# Patient Record
Sex: Female | Born: 1960 | Race: Black or African American | Hispanic: No | Smoking: Never smoker
Health system: Southern US, Community
[De-identification: ages and names within clinical notes are randomized; demographics above are authoritative.]

## PROBLEM LIST (undated history)

## (undated) DIAGNOSIS — M549 Dorsalgia, unspecified: Secondary | ICD-10-CM

## (undated) DIAGNOSIS — Z800 Family history of malignant neoplasm of digestive organs: Secondary | ICD-10-CM

## (undated) DIAGNOSIS — Z803 Family history of malignant neoplasm of breast: Secondary | ICD-10-CM

## (undated) DIAGNOSIS — M797 Fibromyalgia: Secondary | ICD-10-CM

## (undated) DIAGNOSIS — F32A Depression, unspecified: Secondary | ICD-10-CM

## (undated) DIAGNOSIS — E119 Type 2 diabetes mellitus without complications: Secondary | ICD-10-CM

## (undated) DIAGNOSIS — C801 Malignant (primary) neoplasm, unspecified: Secondary | ICD-10-CM

## (undated) DIAGNOSIS — G43909 Migraine, unspecified, not intractable, without status migrainosus: Secondary | ICD-10-CM

## (undated) DIAGNOSIS — I10 Essential (primary) hypertension: Secondary | ICD-10-CM

## (undated) DIAGNOSIS — D649 Anemia, unspecified: Secondary | ICD-10-CM

## (undated) DIAGNOSIS — E7800 Pure hypercholesterolemia, unspecified: Secondary | ICD-10-CM

## (undated) DIAGNOSIS — E1142 Type 2 diabetes mellitus with diabetic polyneuropathy: Secondary | ICD-10-CM

## (undated) DIAGNOSIS — F419 Anxiety disorder, unspecified: Secondary | ICD-10-CM

## (undated) DIAGNOSIS — G8929 Other chronic pain: Secondary | ICD-10-CM

## (undated) DIAGNOSIS — F329 Major depressive disorder, single episode, unspecified: Secondary | ICD-10-CM

## (undated) HISTORY — DX: Depression, unspecified: F32.A

## (undated) HISTORY — DX: Anxiety disorder, unspecified: F41.9

## (undated) HISTORY — DX: Major depressive disorder, single episode, unspecified: F32.9

## (undated) HISTORY — PX: COLONOSCOPY: SHX174

## (undated) HISTORY — DX: Family history of malignant neoplasm of digestive organs: Z80.0

## (undated) HISTORY — DX: Family history of malignant neoplasm of breast: Z80.3

---

## 1986-12-17 HISTORY — PX: TUBAL LIGATION: SHX77

## 2006-11-17 ENCOUNTER — Emergency Department (HOSPITAL_COMMUNITY): Admission: EM | Admit: 2006-11-17 | Discharge: 2006-11-17 | Payer: Self-pay | Admitting: Emergency Medicine

## 2008-05-18 ENCOUNTER — Emergency Department (HOSPITAL_COMMUNITY): Admission: EM | Admit: 2008-05-18 | Discharge: 2008-05-18 | Payer: Self-pay | Admitting: Emergency Medicine

## 2008-05-19 ENCOUNTER — Emergency Department (HOSPITAL_COMMUNITY): Admission: EM | Admit: 2008-05-19 | Discharge: 2008-05-19 | Payer: Self-pay | Admitting: Emergency Medicine

## 2009-08-26 ENCOUNTER — Inpatient Hospital Stay (HOSPITAL_COMMUNITY): Admission: EM | Admit: 2009-08-26 | Discharge: 2009-08-30 | Payer: Self-pay | Admitting: Emergency Medicine

## 2009-09-01 ENCOUNTER — Encounter: Admission: RE | Admit: 2009-09-01 | Discharge: 2009-09-01 | Payer: Self-pay | Admitting: Internal Medicine

## 2009-09-23 ENCOUNTER — Ambulatory Visit: Payer: Self-pay | Admitting: Internal Medicine

## 2009-09-28 ENCOUNTER — Ambulatory Visit: Payer: Self-pay | Admitting: *Deleted

## 2009-10-11 ENCOUNTER — Ambulatory Visit: Payer: Self-pay | Admitting: Internal Medicine

## 2009-12-07 ENCOUNTER — Ambulatory Visit: Payer: Self-pay | Admitting: Internal Medicine

## 2009-12-07 LAB — CONVERTED CEMR LAB
ALT: 20 units/L (ref 0–35)
AST: 21 units/L (ref 0–37)
Alkaline Phosphatase: 84 units/L (ref 39–117)
BUN: 10 mg/dL (ref 6–23)
Calcium: 9.7 mg/dL (ref 8.4–10.5)
Creatinine, Ser: 0.68 mg/dL (ref 0.40–1.20)
HDL: 54 mg/dL (ref >39)
Hgb A1c MFr Bld: 7.3 % — ABNORMAL HIGH (ref 4.6–6.1)
LDL Cholesterol: 132 mg/dL — ABNORMAL HIGH (ref 0–99)
Total Bilirubin: 0.5 mg/dL (ref 0.3–1.2)
Total CHOL/HDL Ratio: 3.8
VLDL: 21 mg/dL (ref 0–40)

## 2010-01-11 ENCOUNTER — Ambulatory Visit: Payer: Self-pay | Admitting: Internal Medicine

## 2010-04-24 ENCOUNTER — Ambulatory Visit: Payer: Self-pay | Admitting: Internal Medicine

## 2010-04-24 LAB — CONVERTED CEMR LAB
CO2: 24 meq/L (ref 19–32)
Calcium: 9.4 mg/dL (ref 8.4–10.5)
Creatinine, Ser: 0.65 mg/dL (ref 0.40–1.20)
TSH: 1.966 microintl units/mL (ref 0.350–4.500)

## 2010-05-29 ENCOUNTER — Ambulatory Visit: Payer: Self-pay | Admitting: Internal Medicine

## 2011-03-23 LAB — URINALYSIS, ROUTINE W REFLEX MICROSCOPIC
Glucose, UA: >1000 mg/dL — AB
Ketones, ur: 15 mg/dL — AB
Leukocytes, UA: NEGATIVE
Protein, ur: NEGATIVE mg/dL
Urobilinogen, UA: 0.2 mg/dL (ref 0.0–1.0)

## 2011-03-23 LAB — GLUCOSE, CAPILLARY
Glucose-Capillary: 174 mg/dL — ABNORMAL HIGH (ref 70–99)
Glucose-Capillary: 224 mg/dL — ABNORMAL HIGH (ref 70–99)
Glucose-Capillary: 293 mg/dL — ABNORMAL HIGH (ref 70–99)
Glucose-Capillary: 299 mg/dL — ABNORMAL HIGH (ref 70–99)
Glucose-Capillary: 312 mg/dL — ABNORMAL HIGH (ref 70–99)
Glucose-Capillary: 319 mg/dL — ABNORMAL HIGH (ref 70–99)
Glucose-Capillary: 319 mg/dL — ABNORMAL HIGH (ref 70–99)
Glucose-Capillary: 331 mg/dL — ABNORMAL HIGH (ref 70–99)
Glucose-Capillary: 334 mg/dL — ABNORMAL HIGH (ref 70–99)
Glucose-Capillary: 353 mg/dL — ABNORMAL HIGH (ref 70–99)
Glucose-Capillary: 372 mg/dL — ABNORMAL HIGH (ref 70–99)

## 2011-03-23 LAB — LIPID PANEL
HDL: 35 mg/dL — ABNORMAL LOW (ref >39)
Total CHOL/HDL Ratio: 6.4 RATIO
Triglycerides: 449 mg/dL — ABNORMAL HIGH (ref <150)
VLDL: UNDETERMINED mg/dL (ref 0–40)

## 2011-03-23 LAB — BASIC METABOLIC PANEL
CO2: 24 mEq/L (ref 19–32)
CO2: 25 mEq/L (ref 19–32)
CO2: 27 mEq/L (ref 19–32)
Calcium: 8.7 mg/dL (ref 8.4–10.5)
Chloride: 101 mEq/L (ref 96–112)
Chloride: 96 mEq/L (ref 96–112)
GFR calc non Af Amer: >60 mL/min (ref >60)
Glucose, Bld: 273 mg/dL — ABNORMAL HIGH (ref 70–99)
Glucose, Bld: 340 mg/dL — ABNORMAL HIGH (ref 70–99)
Glucose, Bld: 351 mg/dL — ABNORMAL HIGH (ref 70–99)
Glucose, Bld: 480 mg/dL — ABNORMAL HIGH (ref 70–99)
Potassium: 3.5 mEq/L (ref 3.5–5.1)
Potassium: 3.6 mEq/L (ref 3.5–5.1)
Potassium: 4.1 mEq/L (ref 3.5–5.1)
Sodium: 134 mEq/L — ABNORMAL LOW (ref 135–145)
Sodium: 135 mEq/L (ref 135–145)
Sodium: 136 mEq/L (ref 135–145)
Sodium: 136 mEq/L (ref 135–145)

## 2011-03-23 LAB — DIFFERENTIAL
Basophils Absolute: 0 10*3/uL (ref 0.0–0.1)
Basophils Relative: 0 % (ref 0–1)
Eosinophils Relative: 4 % (ref 0–5)
Lymphocytes Relative: 25 % (ref 12–46)
Monocytes Absolute: 0.4 10*3/uL (ref 0.1–1.0)
Monocytes Absolute: 0.5 10*3/uL (ref 0.1–1.0)
Monocytes Relative: 3 % (ref 3–12)
Neutro Abs: 11.4 10*3/uL — ABNORMAL HIGH (ref 1.7–7.7)

## 2011-03-23 LAB — CBC
HCT: 36.5 % (ref 36.0–46.0)
HCT: 38.3 % (ref 36.0–46.0)
Hemoglobin: 12 g/dL (ref 12.0–15.0)
Hemoglobin: 12.7 g/dL (ref 12.0–15.0)
Hemoglobin: 14.3 g/dL (ref 12.0–15.0)
MCHC: 33 g/dL (ref 30.0–36.0)
MCV: 80.2 fL (ref 78.0–100.0)
RBC: 4.78 MIL/uL (ref 3.87–5.11)
RBC: 5.41 MIL/uL — ABNORMAL HIGH (ref 3.87–5.11)
RDW: 15 % (ref 11.5–15.5)
RDW: 15.1 % (ref 11.5–15.5)

## 2011-03-23 LAB — POCT I-STAT 3, VENOUS BLOOD GAS (G3P V)
Bicarbonate: 27.8 mEq/L — ABNORMAL HIGH (ref 20.0–24.0)
pH, Ven: 7.349 — ABNORMAL HIGH (ref 7.250–7.300)
pO2, Ven: 21 mmHg — CL (ref 30.0–45.0)

## 2011-03-23 LAB — URINE MICROSCOPIC-ADD ON

## 2011-03-23 LAB — TSH: TSH: 1.586 u[IU]/mL (ref 0.350–4.500)

## 2011-03-23 LAB — HEMOGLOBIN A1C: Hgb A1c MFr Bld: 11.3 % — ABNORMAL HIGH (ref 4.6–6.1)

## 2011-09-13 LAB — DIFFERENTIAL
Basophils Relative: 0
Eosinophils Absolute: 0.4
Eosinophils Relative: 2
Lymphs Abs: 3.9
Monocytes Relative: 2 — ABNORMAL LOW
Neutrophils Relative %: 76

## 2011-09-13 LAB — CBC
Hemoglobin: 11.5 — ABNORMAL LOW
MCHC: 33.4
Platelets: 561 — ABNORMAL HIGH
RDW: 14.9

## 2011-09-13 LAB — POCT I-STAT, CHEM 8
Creatinine, Ser: 0.6
Glucose, Bld: 103 — ABNORMAL HIGH
HCT: 37
Hemoglobin: 12.6
Potassium: 3.5
Sodium: 137
TCO2: 21

## 2011-09-13 LAB — URINALYSIS, ROUTINE W REFLEX MICROSCOPIC
Glucose, UA: NEGATIVE
Ketones, ur: NEGATIVE
Specific Gravity, Urine: 1.031 — ABNORMAL HIGH
pH: 6

## 2011-09-13 LAB — WET PREP, GENITAL

## 2011-09-13 LAB — URINE MICROSCOPIC-ADD ON

## 2012-06-09 ENCOUNTER — Other Ambulatory Visit (HOSPITAL_COMMUNITY): Payer: Self-pay

## 2012-06-12 ENCOUNTER — Other Ambulatory Visit (HOSPITAL_COMMUNITY): Payer: Self-pay | Admitting: Radiology

## 2012-06-12 DIAGNOSIS — R0602 Shortness of breath: Secondary | ICD-10-CM

## 2012-06-13 ENCOUNTER — Ambulatory Visit (HOSPITAL_COMMUNITY): Payer: Self-pay | Attending: Internal Medicine | Admitting: Radiology

## 2012-06-13 DIAGNOSIS — R0602 Shortness of breath: Secondary | ICD-10-CM | POA: Insufficient documentation

## 2012-06-13 DIAGNOSIS — E669 Obesity, unspecified: Secondary | ICD-10-CM | POA: Insufficient documentation

## 2012-06-13 DIAGNOSIS — M7989 Other specified soft tissue disorders: Secondary | ICD-10-CM | POA: Insufficient documentation

## 2012-06-13 DIAGNOSIS — R609 Edema, unspecified: Secondary | ICD-10-CM

## 2012-06-13 DIAGNOSIS — E119 Type 2 diabetes mellitus without complications: Secondary | ICD-10-CM | POA: Insufficient documentation

## 2012-06-13 DIAGNOSIS — I10 Essential (primary) hypertension: Secondary | ICD-10-CM | POA: Insufficient documentation

## 2012-06-13 NOTE — Progress Notes (Signed)
Echocardiogram performed.  

## 2012-06-16 ENCOUNTER — Encounter (HOSPITAL_COMMUNITY): Payer: Self-pay | Admitting: Family Medicine

## 2013-01-20 ENCOUNTER — Emergency Department (HOSPITAL_COMMUNITY)
Admission: EM | Admit: 2013-01-20 | Discharge: 2013-01-20 | Disposition: A | Discharge status: Home / Self Care | Payer: No Typology Code available for payment source | Attending: Emergency Medicine | Admitting: Emergency Medicine

## 2013-01-20 ENCOUNTER — Encounter (HOSPITAL_COMMUNITY): Payer: Self-pay | Admitting: *Deleted

## 2013-01-20 ENCOUNTER — Emergency Department (HOSPITAL_COMMUNITY): Payer: No Typology Code available for payment source

## 2013-01-20 DIAGNOSIS — A5901 Trichomonal vulvovaginitis: Secondary | ICD-10-CM | POA: Insufficient documentation

## 2013-01-20 DIAGNOSIS — Z9119 Patient's noncompliance with other medical treatment and regimen: Secondary | ICD-10-CM | POA: Insufficient documentation

## 2013-01-20 DIAGNOSIS — Z862 Personal history of diseases of the blood and blood-forming organs and certain disorders involving the immune mechanism: Secondary | ICD-10-CM | POA: Insufficient documentation

## 2013-01-20 DIAGNOSIS — Z91199 Patient's noncompliance with other medical treatment and regimen due to unspecified reason: Secondary | ICD-10-CM | POA: Insufficient documentation

## 2013-01-20 DIAGNOSIS — Z760 Encounter for issue of repeat prescription: Secondary | ICD-10-CM | POA: Insufficient documentation

## 2013-01-20 DIAGNOSIS — A599 Trichomoniasis, unspecified: Secondary | ICD-10-CM

## 2013-01-20 DIAGNOSIS — I10 Essential (primary) hypertension: Secondary | ICD-10-CM | POA: Insufficient documentation

## 2013-01-20 DIAGNOSIS — B373 Candidiasis of vulva and vagina: Secondary | ICD-10-CM | POA: Insufficient documentation

## 2013-01-20 DIAGNOSIS — R739 Hyperglycemia, unspecified: Secondary | ICD-10-CM

## 2013-01-20 DIAGNOSIS — B379 Candidiasis, unspecified: Secondary | ICD-10-CM

## 2013-01-20 DIAGNOSIS — Z8639 Personal history of other endocrine, nutritional and metabolic disease: Secondary | ICD-10-CM | POA: Insufficient documentation

## 2013-01-20 DIAGNOSIS — N949 Unspecified condition associated with female genital organs and menstrual cycle: Secondary | ICD-10-CM | POA: Insufficient documentation

## 2013-01-20 DIAGNOSIS — Z8679 Personal history of other diseases of the circulatory system: Secondary | ICD-10-CM | POA: Insufficient documentation

## 2013-01-20 DIAGNOSIS — R358 Other polyuria: Secondary | ICD-10-CM | POA: Insufficient documentation

## 2013-01-20 DIAGNOSIS — B3731 Acute candidiasis of vulva and vagina: Secondary | ICD-10-CM | POA: Insufficient documentation

## 2013-01-20 DIAGNOSIS — E1169 Type 2 diabetes mellitus with other specified complication: Secondary | ICD-10-CM | POA: Insufficient documentation

## 2013-01-20 DIAGNOSIS — N898 Other specified noninflammatory disorders of vagina: Secondary | ICD-10-CM | POA: Insufficient documentation

## 2013-01-20 DIAGNOSIS — R631 Polydipsia: Secondary | ICD-10-CM | POA: Insufficient documentation

## 2013-01-20 DIAGNOSIS — Z8669 Personal history of other diseases of the nervous system and sense organs: Secondary | ICD-10-CM | POA: Insufficient documentation

## 2013-01-20 DIAGNOSIS — R3589 Other polyuria: Secondary | ICD-10-CM | POA: Insufficient documentation

## 2013-01-20 HISTORY — DX: Fibromyalgia: M79.7

## 2013-01-20 HISTORY — DX: Essential (primary) hypertension: I10

## 2013-01-20 HISTORY — DX: Migraine, unspecified, not intractable, without status migrainosus: G43.909

## 2013-01-20 HISTORY — DX: Pure hypercholesterolemia, unspecified: E78.00

## 2013-01-20 LAB — GLUCOSE, CAPILLARY: Glucose-Capillary: 247 mg/dL — ABNORMAL HIGH (ref 70–99)

## 2013-01-20 LAB — URINALYSIS, ROUTINE W REFLEX MICROSCOPIC
Leukocytes, UA: NEGATIVE
Nitrite: NEGATIVE
Specific Gravity, Urine: 1.041 — ABNORMAL HIGH (ref 1.005–1.030)
Urobilinogen, UA: 0.2 mg/dL (ref 0.0–1.0)
pH: 5 (ref 5.0–8.0)

## 2013-01-20 LAB — URINE MICROSCOPIC-ADD ON

## 2013-01-20 LAB — WET PREP, GENITAL: Clue Cells Wet Prep HPF POC: NONE SEEN

## 2013-01-20 LAB — COMPREHENSIVE METABOLIC PANEL
ALT: 19 U/L (ref 0–35)
Albumin: 3.7 g/dL (ref 3.5–5.2)
Calcium: 10.3 mg/dL (ref 8.4–10.5)
GFR calc Af Amer: 82 mL/min — ABNORMAL LOW (ref >90)
Glucose, Bld: 508 mg/dL — ABNORMAL HIGH (ref 70–99)
Sodium: 129 mEq/L — ABNORMAL LOW (ref 135–145)
Total Protein: 8.8 g/dL — ABNORMAL HIGH (ref 6.0–8.3)

## 2013-01-20 LAB — CBC WITH DIFFERENTIAL/PLATELET
Basophils Absolute: 0 10*3/uL (ref 0.0–0.1)
Basophils Relative: 0 % (ref 0–1)
Eosinophils Absolute: 0.4 10*3/uL (ref 0.0–0.7)
Eosinophils Relative: 2 % (ref 0–5)
Lymphs Abs: 5.1 10*3/uL — ABNORMAL HIGH (ref 0.7–4.0)
MCH: 25.7 pg — ABNORMAL LOW (ref 26.0–34.0)
MCHC: 33.2 g/dL (ref 30.0–36.0)
MCV: 77.5 fL — ABNORMAL LOW (ref 78.0–100.0)
Platelets: 629 10*3/uL — ABNORMAL HIGH (ref 150–400)
RDW: 13.7 % (ref 11.5–15.5)

## 2013-01-20 MED ORDER — INSULIN GLARGINE 100 UNIT/ML ~~LOC~~ SOLN: 65.0000 [IU] | Freq: Every day | SUBCUTANEOUS | Status: DC

## 2013-01-20 MED ORDER — IOHEXOL 300 MG/ML  SOLN
25.0000 mL | INTRAMUSCULAR | Status: AC
  Administered 2013-01-20 (×2): 25 mL via ORAL

## 2013-01-20 MED ORDER — INSULIN ASPART 100 UNIT/ML ~~LOC~~ SOLN
12.0000 [IU] | Freq: Once | SUBCUTANEOUS | Status: AC
  Administered 2013-01-20: 12 [IU] via SUBCUTANEOUS
  Filled 2013-01-20: qty 1

## 2013-01-20 MED ORDER — FLUCONAZOLE 100 MG PO TABS
100.0000 mg | ORAL_TABLET | Freq: Once | ORAL | Status: AC
  Administered 2013-01-20: 100 mg via ORAL
  Filled 2013-01-20: qty 1

## 2013-01-20 MED ORDER — METFORMIN HCL 1000 MG PO TABS: 1000.0000 mg | ORAL_TABLET | Freq: Two times a day (BID) | ORAL | Status: DC

## 2013-01-20 MED ORDER — SODIUM CHLORIDE 0.9 % IV SOLN: INTRAVENOUS | Status: DC

## 2013-01-20 MED ORDER — DICLOFENAC SODIUM 75 MG PO TBEC: 75.0000 mg | DELAYED_RELEASE_TABLET | Freq: Two times a day (BID) | ORAL | Status: DC

## 2013-01-20 MED ORDER — TELMISARTAN-HCTZ 40-12.5 MG PO TABS: 1.0000 | ORAL_TABLET | Freq: Every day | ORAL | Status: DC

## 2013-01-20 MED ORDER — IOHEXOL 300 MG/ML  SOLN
100.0000 mL | Freq: Once | INTRAMUSCULAR | Status: AC | PRN
  Administered 2013-01-20: 100 mL via INTRAVENOUS

## 2013-01-20 MED ORDER — METRONIDAZOLE 500 MG PO TABS: 500.0000 mg | ORAL_TABLET | Freq: Two times a day (BID) | ORAL | Status: DC

## 2013-01-20 MED ORDER — SODIUM CHLORIDE 0.9 % IV BOLUS (SEPSIS)
1000.0000 mL | Freq: Once | INTRAVENOUS | Status: AC
  Administered 2013-01-20: 1000 mL via INTRAVENOUS

## 2013-01-20 MED ORDER — DOXYCYCLINE HYCLATE 100 MG PO CAPS: 100.0000 mg | ORAL_CAPSULE | Freq: Two times a day (BID) | ORAL | Status: DC

## 2013-01-20 NOTE — ED Notes (Signed)
PT was [facility] pt and is out of medications for blood pressure, neuropathy and diabetes.  Pt is also c/o having "swollen" labia, vaginal itching and malodorous discharge. States she is not sexually active.

## 2013-01-20 NOTE — ED Notes (Signed)
Transported to X-Ray °

## 2013-01-20 NOTE — ED Provider Notes (Addendum)
History     CSN: [id number]  Arrival date & time 01/20/13  1219   First MD Initiated Contact with Patient 01/20/13 1235      Chief Complaint  Patient presents with  . Medication Refill  . Vaginal Itching    (Consider location/radiation/quality/duration/timing/severity/associated sxs/prior treatment) Patient is a 52 y.o. female presenting with vaginal itching. The history is provided by the patient.  Vaginal Itching   patient here with her 3 weeks of vaginal itching with white discharge. She also notes that she has been noncompliant with her antihypertensive medication as well as her diabetic meds. This is due to her not having a physician currently. Some polyuria and polydipsia. No fever chills or vomiting. Chest pain abdominal pain. Does note some increased weakness. Has been taking her insulin but now her oral medications for diabetes.  Past Medical History  Diagnosis Date  . Hypertension   . Diabetes mellitus without complication   . Migraines   . Hypercholesteremia   . Fibromyalgia   . Neuropathy     History reviewed. No pertinent past surgical history.  No family history on file.  History  Substance Use Topics  . Smoking status: Never Smoker   . Smokeless tobacco: Not on file  . Alcohol Use: Yes     Comment: occasionally    OB History    Grav Para Term Preterm Abortions TAB SAB Ect Mult Living                  Review of Systems  All other systems reviewed and are negative.    Allergies  Review of patient's allergies indicates no known allergies.  Home Medications  No current outpatient prescriptions on file.  BP 169/94  Pulse 76  Temp 98.9 F (37.2 C) (Oral)  Resp 20  SpO2 100%  LMP 01/13/2013  Physical Exam  Nursing note and vitals reviewed. Constitutional: She is oriented to person, place, and time. She appears well-developed and well-nourished.  Non-toxic appearance. No distress.  HENT:  Head: Normocephalic and atraumatic.  Eyes:  Conjunctivae normal, EOM and lids are normal. Pupils are equal, round, and reactive to light.  Neck: Normal range of motion. Neck supple. No tracheal deviation present. No mass present.  Cardiovascular: Normal rate, regular rhythm and normal heart sounds.  Exam reveals no gallop.   No murmur heard. Pulmonary/Chest: Effort normal and breath sounds normal. No stridor. No respiratory distress. She has no decreased breath sounds. She has no wheezes. She has no rhonchi. She has no rales.  Abdominal: Soft. Normal appearance and bowel sounds are normal. She exhibits no distension. There is no tenderness. There is no rebound and no CVA tenderness.  Genitourinary: There is rash and tenderness on the right labia. There is rash and tenderness on the left labia. Vaginal discharge found.  Musculoskeletal: Normal range of motion. She exhibits no edema and no tenderness.  Neurological: She is alert and oriented to person, place, and time. She has normal strength. No cranial nerve deficit or sensory deficit. GCS eye subscore is 4. GCS verbal subscore is 5. GCS motor subscore is 6.  Skin: Skin is warm and dry. No abrasion and no rash noted.  Psychiatric: She has a normal mood and affect. Her speech is normal and behavior is normal.    ED Course  Procedures (including critical care time)   Labs Reviewed  URINALYSIS, ROUTINE W REFLEX MICROSCOPIC  WET PREP, GENITAL   No results found.   No diagnosis found.  MDM  Patient given IV fluids and insulin for her hyperglycemia. Repeat blood sugar is now in the 200s. Patient be treated for her yeast infection as well as Trichomonas( patient denies any recent history of sexual contact for over a year). I will refill her medications for her diabetes and high blood pressure. Concern that patient might have a labia cellulitis and will place patient on antibiotics as well.        Toy Baker, MD 01/20/13 2130  Toy Baker, MD 01/20/13 [phone]

## 2013-01-20 NOTE — ED Notes (Signed)
CBG was 247. Notified Nurse Sherrie George.

## 2013-01-21 LAB — GC/CHLAMYDIA PROBE AMP: CT Probe RNA: NEGATIVE

## 2013-07-03 ENCOUNTER — Other Ambulatory Visit (HOSPITAL_COMMUNITY): Payer: Self-pay | Admitting: Internal Medicine

## 2013-07-03 DIAGNOSIS — Z1231 Encounter for screening mammogram for malignant neoplasm of breast: Secondary | ICD-10-CM

## 2013-12-07 ENCOUNTER — Observation Stay (HOSPITAL_COMMUNITY): Payer: Self-pay

## 2013-12-07 ENCOUNTER — Observation Stay (HOSPITAL_COMMUNITY)
Admission: EM | Admit: 2013-12-07 | Discharge: 2013-12-08 | Disposition: A | Discharge status: Home / Self Care | Payer: Self-pay | Attending: Internal Medicine | Admitting: Internal Medicine

## 2013-12-07 ENCOUNTER — Encounter (HOSPITAL_COMMUNITY): Payer: Self-pay | Admitting: Emergency Medicine

## 2013-12-07 DIAGNOSIS — Z794 Long term (current) use of insulin: Secondary | ICD-10-CM | POA: Insufficient documentation

## 2013-12-07 DIAGNOSIS — R7309 Other abnormal glucose: Secondary | ICD-10-CM

## 2013-12-07 DIAGNOSIS — E119 Type 2 diabetes mellitus without complications: Principal | ICD-10-CM | POA: Diagnosis present

## 2013-12-07 DIAGNOSIS — I10 Essential (primary) hypertension: Secondary | ICD-10-CM | POA: Insufficient documentation

## 2013-12-07 DIAGNOSIS — N390 Urinary tract infection, site not specified: Secondary | ICD-10-CM | POA: Diagnosis present

## 2013-12-07 DIAGNOSIS — E7800 Pure hypercholesterolemia, unspecified: Secondary | ICD-10-CM | POA: Insufficient documentation

## 2013-12-07 DIAGNOSIS — IMO0001 Reserved for inherently not codable concepts without codable children: Secondary | ICD-10-CM | POA: Insufficient documentation

## 2013-12-07 DIAGNOSIS — M797 Fibromyalgia: Secondary | ICD-10-CM

## 2013-12-07 DIAGNOSIS — R739 Hyperglycemia, unspecified: Secondary | ICD-10-CM

## 2013-12-07 HISTORY — DX: Type 2 diabetes mellitus without complications: E11.9

## 2013-12-07 HISTORY — DX: Dorsalgia, unspecified: M54.9

## 2013-12-07 HISTORY — DX: Other chronic pain: G89.29

## 2013-12-07 HISTORY — DX: Anemia, unspecified: D64.9

## 2013-12-07 HISTORY — DX: Type 2 diabetes mellitus with diabetic polyneuropathy: E11.42

## 2013-12-07 LAB — URINE MICROSCOPIC-ADD ON

## 2013-12-07 LAB — URINALYSIS, ROUTINE W REFLEX MICROSCOPIC
Glucose, UA: >1000 mg/dL — AB
Ketones, ur: NEGATIVE mg/dL
Protein, ur: 30 mg/dL — AB
pH: 5.5 (ref 5.0–8.0)

## 2013-12-07 LAB — COMPREHENSIVE METABOLIC PANEL
Albumin: 3.9 g/dL (ref 3.5–5.2)
Alkaline Phosphatase: 118 U/L — ABNORMAL HIGH (ref 39–117)
BUN: 18 mg/dL (ref 6–23)
CO2: 25 mEq/L (ref 19–32)
Calcium: 9.9 mg/dL (ref 8.4–10.5)
Chloride: 87 mEq/L — ABNORMAL LOW (ref 96–112)
GFR calc Af Amer: 48 mL/min — ABNORMAL LOW (ref >90)
Glucose, Bld: 413 mg/dL — ABNORMAL HIGH (ref 70–99)
Potassium: 3.9 mEq/L (ref 3.5–5.1)
Total Bilirubin: 0.4 mg/dL (ref 0.3–1.2)

## 2013-12-07 LAB — CBC
HCT: 38.6 % (ref 36.0–46.0)
Hemoglobin: 12.9 g/dL (ref 12.0–15.0)
RBC: 4.88 MIL/uL (ref 3.87–5.11)
WBC: 20.8 10*3/uL — ABNORMAL HIGH (ref 4.0–10.5)

## 2013-12-07 MED ORDER — SODIUM CHLORIDE 0.9 % IV BOLUS (SEPSIS)
1000.0000 mL | Freq: Once | INTRAVENOUS | Status: AC
  Administered 2013-12-07: 1000 mL via INTRAVENOUS

## 2013-12-07 MED ORDER — ACETAMINOPHEN 325 MG PO TABS
650.0000 mg | ORAL_TABLET | Freq: Four times a day (QID) | ORAL | Status: DC | PRN
  Administered 2013-12-07 – 2013-12-08 (×2): 650 mg via ORAL
  Filled 2013-12-07 (×2): qty 2

## 2013-12-07 MED ORDER — INSULIN DETEMIR 100 UNIT/ML ~~LOC~~ SOLN
40.0000 [IU] | Freq: Every day | SUBCUTANEOUS | Status: DC
  Administered 2013-12-08: 40 [IU] via SUBCUTANEOUS
  Filled 2013-12-07: qty 0.4

## 2013-12-07 MED ORDER — METRONIDAZOLE 500 MG PO TABS
500.0000 mg | ORAL_TABLET | Freq: Three times a day (TID) | ORAL | Status: DC
  Administered 2013-12-07 – 2013-12-08 (×2): 500 mg via ORAL
  Filled 2013-12-07 (×6): qty 1

## 2013-12-07 MED ORDER — ONDANSETRON HCL 4 MG PO TABS: 4.0000 mg | ORAL_TABLET | Freq: Four times a day (QID) | ORAL | Status: DC | PRN

## 2013-12-07 MED ORDER — ONDANSETRON HCL 4 MG/2ML IJ SOLN: 4.0000 mg | Freq: Three times a day (TID) | INTRAMUSCULAR | Status: DC | PRN

## 2013-12-07 MED ORDER — ACETAMINOPHEN 650 MG RE SUPP: 650.0000 mg | Freq: Four times a day (QID) | RECTAL | Status: DC | PRN

## 2013-12-07 MED ORDER — ENOXAPARIN SODIUM 40 MG/0.4ML ~~LOC~~ SOLN
40.0000 mg | SUBCUTANEOUS | Status: DC
  Administered 2013-12-07: 40 mg via SUBCUTANEOUS
  Filled 2013-12-07 (×2): qty 0.4

## 2013-12-07 MED ORDER — LEVOFLOXACIN IN D5W 750 MG/150ML IV SOLN: 750.0000 mg | INTRAVENOUS | Status: DC

## 2013-12-07 MED ORDER — POTASSIUM CHLORIDE IN NACL 20-0.9 MEQ/L-% IV SOLN
INTRAVENOUS | Status: DC
  Administered 2013-12-07: 100 mL/h via INTRAVENOUS
  Filled 2013-12-07 (×4): qty 1000

## 2013-12-07 MED ORDER — METOPROLOL TARTRATE 50 MG PO TABS
50.0000 mg | ORAL_TABLET | Freq: Two times a day (BID) | ORAL | Status: DC
  Administered 2013-12-08: 50 mg via ORAL
  Filled 2013-12-07 (×4): qty 1

## 2013-12-07 MED ORDER — INSULIN GLARGINE 100 UNIT/ML ~~LOC~~ SOLN
30.0000 [IU] | Freq: Once | SUBCUTANEOUS | Status: AC
  Administered 2013-12-07: 30 [IU] via SUBCUTANEOUS
  Filled 2013-12-07: qty 0.3

## 2013-12-07 MED ORDER — INSULIN REGULAR HUMAN 100 UNIT/ML IJ SOLN
INTRAMUSCULAR | Status: DC
  Filled 2013-12-07: qty 1

## 2013-12-07 MED ORDER — ONDANSETRON HCL 4 MG/2ML IJ SOLN: 4.0000 mg | Freq: Four times a day (QID) | INTRAMUSCULAR | Status: DC | PRN

## 2013-12-07 MED ORDER — LEVOFLOXACIN IN D5W 750 MG/150ML IV SOLN
750.0000 mg | INTRAVENOUS | Status: DC
  Filled 2013-12-07: qty 150

## 2013-12-07 MED ORDER — ASPIRIN EC 81 MG PO TBEC
81.0000 mg | DELAYED_RELEASE_TABLET | Freq: Every day | ORAL | Status: DC
  Administered 2013-12-08: 81 mg via ORAL
  Filled 2013-12-07: qty 1

## 2013-12-07 MED ORDER — SODIUM CHLORIDE 0.9 % IJ SOLN: 3.0000 mL | Freq: Two times a day (BID) | INTRAMUSCULAR | Status: DC

## 2013-12-07 MED ORDER — SIMVASTATIN 5 MG PO TABS
5.0000 mg | ORAL_TABLET | Freq: Every day | ORAL | Status: DC
  Filled 2013-12-07: qty 1

## 2013-12-07 MED ORDER — INSULIN ASPART 100 UNIT/ML ~~LOC~~ SOLN
0.0000 [IU] | Freq: Three times a day (TID) | SUBCUTANEOUS | Status: DC
  Administered 2013-12-08 (×2): 5 [IU] via SUBCUTANEOUS

## 2013-12-07 MED ORDER — INSULIN ASPART 100 UNIT/ML ~~LOC~~ SOLN
8.0000 [IU] | Freq: Once | SUBCUTANEOUS | Status: AC
  Administered 2013-12-07: 8 [IU] via SUBCUTANEOUS

## 2013-12-07 MED ORDER — INSULIN ASPART 100 UNIT/ML ~~LOC~~ SOLN
0.0000 [IU] | Freq: Every day | SUBCUTANEOUS | Status: DC
  Administered 2013-12-07: 5 [IU] via SUBCUTANEOUS

## 2013-12-07 NOTE — ED Notes (Signed)
Per Admitting MD. He is going to cancel IV insulin and patient can go ahead and eat. Secretary is ordering pt carb modified tray.

## 2013-12-07 NOTE — Progress Notes (Signed)
Report called and received from Sarah RN at 17:15

## 2013-12-07 NOTE — ED Notes (Signed)
Pt going upstairs, at this time.

## 2013-12-07 NOTE — H&P (Signed)
[facility] History and Physical  CIGI BEGA ZOX:[id number] DOB: [birth year] DOA: 12/07/2013  Referring physician:  PCP: Default, Provider, MD  Specialists:   Chief Complaint: hyperglycemia   HPI: Christina Webster is a 52 y.o. female with PMH of IDDM, HTN, HPL, fibromyalgia presented some urinary frequency, urgency and uncontrolled diabetes, despite taking her meds; denies SOB, no chest pain, no abdominal pain, no nausea, vomiting or diarrhea, no polydipsia, or polyuria;   Review of Systems: The patient denies anorexia, fever, weight loss,, vision loss, decreased hearing, hoarseness, chest pain, syncope, dyspnea on exertion, peripheral edema, balance deficits, hemoptysis, abdominal pain, melena, hematochezia, severe indigestion/heartburn, hematuria, incontinence, genital sores, muscle weakness, suspicious skin lesions, transient blindness, difficulty walking, depression, unusual weight change, abnormal bleeding, enlarged lymph nodes, angioedema, and breast masses.    Past Medical History  Diagnosis Date  . Hypertension   . Diabetes mellitus without complication   . Migraines   . Hypercholesteremia   . Fibromyalgia   . Neuropathy    History reviewed. No pertinent past surgical history. Social History:  reports that she has never smoked. She does not have any smokeless tobacco history on file. She reports that she drinks alcohol. She reports that she does not use illicit drugs. Home;  where does patient live--home, ALF, SNF? and with whom if at home? Yes;  Can patient participate in ADLs?  No Known Allergies  History reviewed. No pertinent family history. h/o CAD (be sure to complete)  Prior to Admission medications   Medication Sig Start Date End Date Taking? Authorizing Provider  aspirin EC 81 MG tablet Take 81 mg by mouth daily.   Yes Historical Provider, MD  diclofenac (VOLTAREN) 75 MG EC tablet Take 1 tablet (75 mg total) by mouth 2 (two) times daily. 01/20/13  Yes Toy Baker, MD  furosemide (LASIX) 20 MG tablet Take 20 mg by mouth daily.   Yes Historical Provider, MD  insulin detemir (LEVEMIR) 100 UNIT/ML injection Inject 40 Units into the skin daily.   Yes Historical Provider, MD  losartan-hydrochlorothiazide (HYZAAR) 100-12.5 MG per tablet Take 1 tablet by mouth daily.   Yes Historical Provider, MD  metFORMIN (GLUCOPHAGE) 1000 MG tablet Take 1 tablet (1,000 mg total) by mouth 2 (two) times daily. 01/20/13  Yes Toy Baker, MD  metoprolol (LOPRESSOR) 50 MG tablet Take 50 mg by mouth 2 (two) times daily with a meal.   Yes Historical Provider, MD  pravastatin (PRAVACHOL) 40 MG tablet Take 40 mg by mouth daily.   Yes Historical Provider, MD   Physical Exam: Filed Vitals:   12/07/13 1707  BP: 114/52  Pulse: 80  Temp:   Resp: 18     General:  alert  Eyes: EOM-I  ENT: no oral rash   Neck: supple   Cardiovascular: s1,s2 rrr  Respiratory: CTA BL  Abdomen: soft, obese, NT, ND  Skin: no rash  Musculoskeletal: non pitting LE edema  Psychiatric: no hallucinations   Neurologic: CN 2-12 intact, motor 5/5 BL  Labs on Admission:  Basic Metabolic Panel:  Recent Labs Lab 12/07/13 1215  NA 128*  K 3.9  CL 87*  CO2 25  GLUCOSE 413*  BUN 18  CREATININE 1.43*  CALCIUM 9.9   Liver Function Tests:  Recent Labs Lab 12/07/13 1215  AST 15  ALT 15  ALKPHOS 118*  BILITOT 0.4  PROT 8.4*  ALBUMIN 3.9   No results found for this basename: LIPASE, AMYLASE,  in the last 168 hours  No results found for this basename: AMMONIA,  in the last 168 hours CBC:  Recent Labs Lab 12/07/13 1215  WBC 20.8*  HGB 12.9  HCT 38.6  MCV 79.1  PLT 593*   Cardiac Enzymes: No results found for this basename: CKTOTAL, CKMB, CKMBINDEX, TROPONINI,  in the last 168 hours  BNP (last 3 results) No results found for this basename: PROBNP,  in the last 8760 hours CBG:  Recent Labs Lab 12/07/13 1217  GLUCAP 460*    Radiological Exams on  Admission: No results found.  EKG: Independently reviewed. Not done   Assessment/Plan Principal Problem:   Hyperglycemia Active Problems:   DM (diabetes mellitus)   HTN (hypertension)   Fibromyalgia   UTI (urinary tract infection)  52 y.o. female with PMH of IDDM, HTN, HPL, fibromyalgia presented some urinary frequency, urgency and admitted with uncontrolled diabetes, and UTI  1. IDDM uncontrolled likely worse with infection/uti; patient is no distress; bicarb 25, mild AG; no recent HA1C -SQ aspart 8 units x 1, lantus 30 units x1, +ISS; IVF; monitor glucose; if uncontrolled will start IV insulin; check a1c -may need to d/c metformin upon discharge due to renal insufficiency   2. UTI; IV atx empiric pend urine c/s; +trichomonas; start flagyl, screen std  3. AKI' hypo NA likely prerenal dehydration/uncontroled DM; IVF, recheck lytes in AM; hold lasix, ARB/hctz, d/c nsaids   4. Leukocytosis likely infectious related; treat UTI, obtain CXR  5. HTN cont BB, hold diuretics, ABR;   none if consultant consulted, please document name and whether formally or informally consulted  Code Status: full (must indicate code status--if unknown or must be presumed, indicate so) Family Communication: d/w daughter at the bedside (indicate person spoken with, if applicable, with phone number if by telephone) Disposition Plan: home in 24-48 hours  (indicate anticipated LOS)  Time spent: > 35 minutes   [name] [facility] Pager [phone]  If 7PM-7AM, please contact night-coverage [email or web address] Password [facility] 12/07/2013, 5:11 PM

## 2013-12-07 NOTE — ED Notes (Signed)
Pt in xray then will be transported to floor.

## 2013-12-07 NOTE — ED Notes (Signed)
Pt sts increased hyperglycemia despite taking meds; pt sts seeing some spots

## 2013-12-07 NOTE — ED Provider Notes (Addendum)
CSN: [id number]     Arrival date & time 12/07/13  1202 History   First MD Initiated Contact with Patient 12/07/13 1454     Chief Complaint  Patient presents with  . Hyperglycemia    HPI Pt sts increased hyperglycemia despite taking meds patient also having some urinary symptoms of urgency frequency.  No definite fever.  Patient takes insulin and metformin.  Has been taking her medicines as directed.  Denies eating extra sugar foods during the holidays.  Past Medical History  Diagnosis Date  . Hypertension   . Diabetes mellitus without complication   . Migraines   . Hypercholesteremia   . Fibromyalgia   . Neuropathy    History reviewed. No pertinent past surgical history. History reviewed. No pertinent family history. History  Substance Use Topics  . Smoking status: Never Smoker   . Smokeless tobacco: Not on file  . Alcohol Use: Yes     Comment: occasionally   OB History   Grav Para Term Preterm Abortions TAB SAB Ect Mult Living                 Review of Systems All other systems reviewed and are negative Allergies  Review of patient's allergies indicates no known allergies.  Home Medications   Current Outpatient Rx  Name  Route  Sig  Dispense  Refill  . aspirin EC 81 MG tablet   Oral   Take 81 mg by mouth daily.         . diclofenac (VOLTAREN) 75 MG EC tablet   Oral   Take 1 tablet (75 mg total) by mouth 2 (two) times daily.   60 tablet   2   . furosemide (LASIX) 20 MG tablet   Oral   Take 20 mg by mouth daily.         . insulin detemir (LEVEMIR) 100 UNIT/ML injection   Subcutaneous   Inject 40 Units into the skin daily.         Marland Kitchen losartan-hydrochlorothiazide (HYZAAR) 100-12.5 MG per tablet   Oral   Take 1 tablet by mouth daily.         . metFORMIN (GLUCOPHAGE) 1000 MG tablet   Oral   Take 1 tablet (1,000 mg total) by mouth 2 (two) times daily.   60 tablet   2   . metoprolol (LOPRESSOR) 50 MG tablet   Oral   Take 50 mg by mouth 2 (two)  times daily with a meal.         . pravastatin (PRAVACHOL) 40 MG tablet   Oral   Take 40 mg by mouth daily.          BP 93/80  Pulse 87  Temp(Src) 98.5 F (36.9 C) (Oral)  Resp 18  SpO2 97% Physical Exam  Nursing note and vitals reviewed. Constitutional: She is oriented to person, place, and time. She appears well-developed and well-nourished. No distress.  HENT:  Head: Normocephalic and atraumatic.  Eyes: Pupils are equal, round, and reactive to light.  Neck: Normal range of motion.  Cardiovascular: Normal rate and intact distal pulses.   Pulmonary/Chest: No respiratory distress.  Abdominal: Normal appearance. She exhibits no distension. There is no tenderness.  Musculoskeletal: Normal range of motion.  Neurological: She is alert and oriented to person, place, and time. No cranial nerve deficit.  Skin: Skin is warm and dry. No rash noted.  Psychiatric: She has a normal mood and affect. Her behavior is normal.    ED  Course  Procedures (including critical care time) Labs Review Labs Reviewed  CBC - Abnormal; Notable for the following:    WBC 20.8 (*)    Platelets 593 (*)    All other components within normal limits  COMPREHENSIVE METABOLIC PANEL - Abnormal; Notable for the following:    Sodium 128 (*)    Chloride 87 (*)    Glucose, Bld 413 (*)    Creatinine, Ser 1.43 (*)    Total Protein 8.4 (*)    Alkaline Phosphatase 118 (*)    GFR calc non Af Amer 42 (*)    GFR calc Af Amer 48 (*)    All other components within normal limits  URINALYSIS, ROUTINE W REFLEX MICROSCOPIC - Abnormal; Notable for the following:    APPearance TURBID (*)    Specific Gravity, Urine 1.035 (*)    Glucose, UA >1000 (*)    Hgb urine dipstick MODERATE (*)    Protein, ur 30 (*)    Leukocytes, UA LARGE (*)    All other components within normal limits  GLUCOSE, CAPILLARY - Abnormal; Notable for the following:    Glucose-Capillary 460 (*)    All other components within normal limits   URINE MICROSCOPIC-ADD ON - Abnormal; Notable for the following:    Squamous Epithelial / LPF MANY (*)    Bacteria, UA FEW (*)    All other components within normal limits  URINE CULTURE  BLOOD GAS, ARTERIAL   Imaging Review No results found. Medications  insulin regular (NOVOLIN R,HUMULIN R) 1 Units/mL in sodium chloride 0.9 % 100 mL infusion (not administered)  sodium chloride 0.9 % bolus 1,000 mL (not administered)  sodium chloride 0.9 % bolus 1,000 mL (0 mLs Intravenous Stopped 12/07/13 1646)    Contacted the hospitalist for admission.  MDM   1. Hyperglycemia        Nelia Shi, MD 12/07/13 1659  Nelia Shi, MD 12/07/13 1700

## 2013-12-07 NOTE — Progress Notes (Signed)
Christina Webster [id number] Code Status: Full   Admission Data: 12/07/2013 6:06 PM Attending Provider:  York Spaniel WUJ:[id number], Provider, MD Consults/ Treatment Team:    Christina Webster is a 52 y.o. female patient admitted from ED awake, alert - oriented  X 3 - no acute distress noted.  VSS - Blood pressure 125/80, pulse 78, temperature 98.2 F (36.8 C), temperature source Oral, resp. rate 18, height 5' (1.524 m), weight 108.591 kg (239 lb 6.4 oz), last menstrual period 11/24/2013, SpO2 98.00%.  no c/o shortness of breath, no c/o chest pain.  IV Fluids:  IV in place, occlusive dsg intact without redness, IV cath antecubital right, condition patent and no redness normal saline.  Allergies:  No Known Allergies   Past Medical History  Diagnosis Date  . Hypertension   . Diabetes mellitus without complication   . Migraines   . Hypercholesteremia   . Fibromyalgia   . Neuropathy    Medications Prior to Admission  Medication Sig Dispense Refill  . aspirin EC 81 MG tablet Take 81 mg by mouth daily.      . diclofenac (VOLTAREN) 75 MG EC tablet Take 1 tablet (75 mg total) by mouth 2 (two) times daily.  60 tablet  2  . furosemide (LASIX) 20 MG tablet Take 20 mg by mouth daily.      . insulin detemir (LEVEMIR) 100 UNIT/ML injection Inject 40 Units into the skin daily.      Marland Kitchen losartan-hydrochlorothiazide (HYZAAR) 100-12.5 MG per tablet Take 1 tablet by mouth daily.      . metFORMIN (GLUCOPHAGE) 1000 MG tablet Take 1 tablet (1,000 mg total) by mouth 2 (two) times daily.  60 tablet  2  . metoprolol (LOPRESSOR) 50 MG tablet Take 50 mg by mouth 2 (two) times daily with a meal.      . pravastatin (PRAVACHOL) 40 MG tablet Take 40 mg by mouth daily.       History:  obtained from the patient.  Orientation to room, and floor completed with information packet given to patient/family.  Patient declined safety video at this time.  Admission INP armband ID verified with patient/family, and in place.   SR up x 2,  fall assessment complete, with patient and family able to verbalize understanding of risk associated with falls, and verbalized understanding to call nsg before up out of bed.  Call light within reach, patient able to voice, and demonstrate understanding.  Skin, clean-dry- intact without evidence of bruising, or skin tears.   No evidence of skin break down noted on exam.     Will cont to eval and treat per MD orders.  Al Decant, [place] 12/07/2013 6:06 PM

## 2013-12-08 LAB — HEMOGLOBIN A1C: Hgb A1c MFr Bld: 13.2 % — ABNORMAL HIGH (ref <5.7)

## 2013-12-08 LAB — BASIC METABOLIC PANEL
BUN: 15 mg/dL (ref 6–23)
CO2: 24 mEq/L (ref 19–32)
Calcium: 8.7 mg/dL (ref 8.4–10.5)
Chloride: 97 mEq/L (ref 96–112)
Creatinine, Ser: 0.94 mg/dL (ref 0.50–1.10)
GFR calc Af Amer: 80 mL/min — ABNORMAL LOW (ref >90)
GFR calc non Af Amer: 69 mL/min — ABNORMAL LOW (ref >90)
Glucose, Bld: 275 mg/dL — ABNORMAL HIGH (ref 70–99)

## 2013-12-08 LAB — GC/CHLAMYDIA PROBE AMP: GC Probe RNA: NEGATIVE

## 2013-12-08 LAB — GLUCOSE, CAPILLARY
Glucose-Capillary: 292 mg/dL — ABNORMAL HIGH (ref 70–99)
Glucose-Capillary: 275 mg/dL — ABNORMAL HIGH (ref 70–99)

## 2013-12-08 MED ORDER — LEVOFLOXACIN 500 MG PO TABS: 500.0000 mg | ORAL_TABLET | Freq: Every day | ORAL | Status: DC

## 2013-12-08 MED ORDER — INSULIN ASPART 100 UNIT/ML ~~LOC~~ SOLN: 0.0000 [IU] | Freq: Three times a day (TID) | SUBCUTANEOUS | Status: DC

## 2013-12-08 MED ORDER — INSULIN DETEMIR 100 UNIT/ML ~~LOC~~ SOLN: 40.0000 [IU] | Freq: Two times a day (BID) | SUBCUTANEOUS | Status: DC

## 2013-12-08 MED ORDER — METRONIDAZOLE 500 MG PO TABS: 500.0000 mg | ORAL_TABLET | Freq: Two times a day (BID) | ORAL | Status: DC

## 2013-12-08 NOTE — Progress Notes (Signed)
Nsg Discharge Note  Admit Date:  12/07/2013 Discharge date: 12/08/2013   Christina Webster to be D/C'd Home per MD order.  AVS completed.  Copy for chart, and copy for patient signed, and dated. Patient/caregiver able to verbalize understanding.  Discharge Medication:   Medication List    STOP taking these medications       diclofenac 75 MG EC tablet  Commonly known as:  VOLTAREN     metFORMIN 1000 MG tablet  Commonly known as:  GLUCOPHAGE      TAKE these medications       aspirin EC 81 MG tablet  Take 81 mg by mouth daily.     furosemide 20 MG tablet  Commonly known as:  LASIX  Take 20 mg by mouth daily.     insulin aspart 100 UNIT/ML injection  Commonly known as:  novoLOG  Inject 0-9 Units into the skin 3 (three) times daily with meals.     insulin detemir 100 UNIT/ML injection  Commonly known as:  LEVEMIR  Inject 0.4 mLs (40 Units total) into the skin 2 (two) times daily.     levofloxacin 500 MG tablet  Commonly known as:  LEVAQUIN  Take 1 tablet (500 mg total) by mouth daily.     losartan-hydrochlorothiazide 100-12.5 MG per tablet  Commonly known as:  HYZAAR  Take 1 tablet by mouth daily.     metoprolol 50 MG tablet  Commonly known as:  LOPRESSOR  Take 50 mg by mouth 2 (two) times daily with a meal.     metroNIDAZOLE 500 MG tablet  Commonly known as:  FLAGYL  Take 1 tablet (500 mg total) by mouth 2 (two) times daily.     pravastatin 40 MG tablet  Commonly known as:  PRAVACHOL  Take 40 mg by mouth daily.        Discharge Assessment: Filed Vitals:   12/08/13 1044  BP: 136/83  Pulse:   Temp:   Resp:    Skin clean, dry and intact without evidence of skin break down, no evidence of skin tears noted. IV catheter discontinued intact. Site without signs and symptoms of complications - no redness or edema noted at insertion site, patient denies c/o pain - only slight tenderness at site.  Dressing with slight pressure applied.  D/c  Instructions-Education: Discharge instructions given to patient/family with verbalized understanding. D/c education completed with patient/family including follow up instructions, medication list, d/c activities limitations if indicated, with other d/c instructions as indicated by MD - patient able to verbalize understanding, all questions fully answered. Patient instructed to return to ED, call 911, or call MD for any changes in condition.  Patient escorted via WC, and D/C home via private auto.  Kern Reap, RN 12/08/2013 2:13 PM  Nsg Discharge Note  Admit Date:  12/07/2013 Discharge date: 12/08/2013   Christina Webster to be D/C'd Home per MD order.  AVS completed.  Copy for chart, and copy for patient signed, and dated. Patient/caregiver able to verbalize understanding. Pt. Had complaints of a headache. I asked her if I could do anything, prn tylenol q 6 hours. Pt. Stated "Thats fine, I'll just take some when I get home."  Discharge Medication:   Medication List    STOP taking these medications       diclofenac 75 MG EC tablet  Commonly known as:  VOLTAREN     metFORMIN 1000 MG tablet  Commonly known as:  GLUCOPHAGE      TAKE  these medications       aspirin EC 81 MG tablet  Take 81 mg by mouth daily.     furosemide 20 MG tablet  Commonly known as:  LASIX  Take 20 mg by mouth daily.     insulin aspart 100 UNIT/ML injection  Commonly known as:  novoLOG  Inject 0-9 Units into the skin 3 (three) times daily with meals.     insulin detemir 100 UNIT/ML injection  Commonly known as:  LEVEMIR  Inject 0.4 mLs (40 Units total) into the skin 2 (two) times daily.     levofloxacin 500 MG tablet  Commonly known as:  LEVAQUIN  Take 1 tablet (500 mg total) by mouth daily.     losartan-hydrochlorothiazide 100-12.5 MG per tablet  Commonly known as:  HYZAAR  Take 1 tablet by mouth daily.     metoprolol 50 MG tablet  Commonly known as:  LOPRESSOR  Take 50 mg by mouth 2  (two) times daily with a meal.     metroNIDAZOLE 500 MG tablet  Commonly known as:  FLAGYL  Take 1 tablet (500 mg total) by mouth 2 (two) times daily.     pravastatin 40 MG tablet  Commonly known as:  PRAVACHOL  Take 40 mg by mouth daily.        Discharge Assessment: Filed Vitals:   12/08/13 1044  BP: 136/83  Pulse:   Temp:   Resp:    Skin clean, dry and intact without evidence of skin break down, no evidence of skin tears noted. IV catheter discontinued intact. Site without signs and symptoms of complications - no redness or edema noted at insertion site, patient denies c/o pain - only slight tenderness at site.  Dressing with slight pressure applied.  D/c Instructions-Education: Discharge instructions given to patient/family with verbalized understanding. D/c education completed with patient/family including follow up instructions, medication list, d/c activities limitations if indicated, with other d/c instructions as indicated by MD - patient able to verbalize understanding, all questions fully answered. Patient instructed to return to ED, call 911, or call MD for any changes in condition.  Patient escorted via WC, and D/C home via private auto.  Kern Reap, RN 12/08/2013 2:13 PM

## 2013-12-08 NOTE — Care Management Note (Signed)
    Page 1 of 1   12/08/2013     3:42:50 PM   CARE MANAGEMENT NOTE 12/08/2013  Patient:  DALAYLA, ALDREDGE   Account Number:  [id number]  Date Initiated:  12/08/2013  Documentation initiated by:  Letha Cape  Subjective/Objective Assessment:   dx hyperglycemia  admit- lives with children. pta indep.     Action/Plan:   Anticipated DC Date:  12/08/2013   Anticipated DC Plan:  HOME/SELF CARE      DC Planning Services  CM consult  MATCH Program      Choice offered to / List presented to:             Status of service:  Completed, signed off Medicare Important Message given?   (If response is "NO", the following Medicare IM given date fields will be blank) Date Medicare IM given:   Date Additional Medicare IM given:    Discharge Disposition:  HOME/SELF CARE  Per UR Regulation:  Reviewed for med. necessity/level of care/duration of stay  If discussed at Long Length of Stay Meetings, dates discussed:    Comments:  12/08/13 15:39 Letha Cape RN, BSN 908 [phone] NCM received call for referral for Med ast, patient already dc home, patient did not know orange card had expired. Patient has called back to aske for ast with medications. NCM called [place] at [phone], patient's daughter to speak with patient.   NCM asked what pharmacy she goes to she stated [facility] at [facility].  NCM called [facility] at [facility] and faxed Match letter to them at  [phone].  Informed the pharmacy that patient will be bringing scripts. [facility] will [facility] to [facility] at [facility] to pick up medications.

## 2013-12-08 NOTE — Discharge Summary (Signed)
Physician Discharge Summary  Christina Webster:[id number] DOB: 07-13-1961 DOA: 12/07/2013  PCP: No PCP Per Patient  Admit date: 12/07/2013 Discharge date: 12/08/2013  Time spent: >35 minutes  Recommendations for Outpatient Follow-up:  F/u with PCP next week to adjust insulin, f/u std screen results Discharge Diagnoses:  Principal Problem:   Hyperglycemia Active Problems:   DM (diabetes mellitus)   HTN (hypertension)   Fibromyalgia   UTI (urinary tract infection)   Discharge Condition: stable   Diet recommendation: DM  Filed Weights   12/07/13 1756  Weight: 108.591 kg (239 lb 6.4 oz)    History of present illness:  52 y.o. female with PMH of IDDM, HTN, HPL, fibromyalgia presented some urinary frequency, urgency and admitted with uncontrolled diabetes, and UTI   Hospital Course:  1. IDDM uncontrolled likely worse with infection/uti; patient is no distress; bicarb 25, mild AG; HA1C -13 -patient was on levemir 40 at home; increased levemir to 40 BID+ ISS; f/u next week with PCP to titrate insulin regimen as needed; d/c metformin upon discharge due to renal insufficiency  2. UTI; atx empiric pend urine c/s; +trichomonas; started flagyl, screen std pend - afebrile, improving; f/u outpatient next week  3. AKI' hypo NA likely prerenal dehydration/uncontroled DM; resolved with IVF, d/c nsaids; f/u BMP next week  4. Leukocytosis likely infectious related; treat UTI, obtain CXR: likely atelectasia, patient has no s/s of pneumonia   5. HTN cont home regimen; f/u BMP next week      Procedures:  none (i.e. Studies not automatically included, echos, thoracentesis, etc; not x-rays)  Consultations:  None   Discharge Exam: Filed Vitals:   12/08/13 0451  BP: 114/75  Pulse: 100  Temp: 98.4 F (36.9 C)  Resp: 20    General: alert Cardiovascular: s1,s2 rrr Respiratory: CTA BL  Discharge Instructions  Discharge Orders   Future Orders Complete By Expires   Diet - low  sodium heart healthy  As directed    Discharge instructions  As directed    Comments:     Please follow up with primary care doctor in 1 week   Increase activity slowly  As directed        Medication List    STOP taking these medications       diclofenac 75 MG EC tablet  Commonly known as:  VOLTAREN     metFORMIN 1000 MG tablet  Commonly known as:  GLUCOPHAGE      TAKE these medications       aspirin EC 81 MG tablet  Take 81 mg by mouth daily.     furosemide 20 MG tablet  Commonly known as:  LASIX  Take 20 mg by mouth daily.     insulin aspart 100 UNIT/ML injection  Commonly known as:  novoLOG  Inject 0-9 Units into the skin 3 (three) times daily with meals.     insulin detemir 100 UNIT/ML injection  Commonly known as:  LEVEMIR  Inject 0.4 mLs (40 Units total) into the skin 2 (two) times daily.     levofloxacin 500 MG tablet  Commonly known as:  LEVAQUIN  Take 1 tablet (500 mg total) by mouth daily.     losartan-hydrochlorothiazide 100-12.5 MG per tablet  Commonly known as:  HYZAAR  Take 1 tablet by mouth daily.     metoprolol 50 MG tablet  Commonly known as:  LOPRESSOR  Take 50 mg by mouth 2 (two) times daily with a meal.     metroNIDAZOLE 500  MG tablet  Commonly known as:  FLAGYL  Take 1 tablet (500 mg total) by mouth 2 (two) times daily.     pravastatin 40 MG tablet  Commonly known as:  PRAVACHOL  Take 40 mg by mouth daily.       No Known Allergies     Follow-up Information   Follow up with No PCP Per Patient In 1 week.   Specialty:  General Practice   Contact information:   [address] [place] [address] [phone]       Follow up with [facility]     In 1 week.   Contact information:   [address] [place] [address] [phone]       The results of significant diagnostics from this hospitalization (including imaging, microbiology, ancillary and laboratory) are listed below for  reference.    Significant Diagnostic Studies: Dg Chest 2 View  12/07/2013   CLINICAL DATA:  Shortness of breath  EXAM: CHEST  2 VIEW  COMPARISON:  08/26/2009  FINDINGS: Low lung volumes. Cardiac silhouette is within normal limits. Area of ill-defined increased density projects in the left lower lobe. The osseous structures unremarkable.  IMPRESSION: Infiltrate was atelectasis left lower lobe. Findings are accentuated by low lung volumes.   Electronically Signed   By: Salome Holmes M.D.   On: 12/07/2013 17:34    Microbiology: Recent Results (from the past 240 hour(s))  URINE CULTURE     Status: None   Collection Time    12/07/13 12:34 PM      Result Value Range Status   Specimen Description URINE, CLEAN CATCH   Final   Special Requests NONE   Final   Culture  Setup Time     Final   Value: 12/07/2013 13:23     Performed at [facility] Count PENDING   Incomplete   Culture     Final   Value: Culture reincubated for better growth     Performed at [facility]   Report Status PENDING   Incomplete     Labs: Basic Metabolic Panel:  Recent Labs Lab 12/07/13 1215 12/08/13 0449  NA 128* 136  K 3.9 4.0  CL 87* 97  CO2 25 24  GLUCOSE 413* 275*  BUN 18 15  CREATININE 1.43* 0.94  CALCIUM 9.9 8.7   Liver Function Tests:  Recent Labs Lab 12/07/13 1215  AST 15  ALT 15  ALKPHOS 118*  BILITOT 0.4  PROT 8.4*  ALBUMIN 3.9   No results found for this basename: LIPASE, AMYLASE,  in the last 168 hours No results found for this basename: AMMONIA,  in the last 168 hours CBC:  Recent Labs Lab 12/07/13 1215  WBC 20.8*  HGB 12.9  HCT 38.6  MCV 79.1  PLT 593*   Cardiac Enzymes: No results found for this basename: CKTOTAL, CKMB, CKMBINDEX, TROPONINI,  in the last 168 hours BNP: BNP (last 3 results) No results found for this basename: PROBNP,  in the last 8760 hours CBG:  Recent Labs Lab 12/07/13 1217 12/07/13 2117 12/08/13 0805  GLUCAP 460*  363* 292*       Signed:  Jonette Mate N  [facility] 12/08/2013, 8:30 AM

## 2013-12-09 LAB — URINE CULTURE: Colony Count: 75000

## 2014-01-21 ENCOUNTER — Ambulatory Visit: Payer: Medicare Other

## 2014-01-28 ENCOUNTER — Ambulatory Visit: Payer: Medicare Other

## 2014-02-04 ENCOUNTER — Ambulatory Visit: Payer: Medicare Other

## 2014-03-04 ENCOUNTER — Encounter: Payer: Medicare Other | Attending: Nurse Practitioner

## 2014-03-04 VITALS — Ht 60.0 in | Wt 239.5 lb

## 2014-03-04 DIAGNOSIS — E119 Type 2 diabetes mellitus without complications: Secondary | ICD-10-CM | POA: Insufficient documentation

## 2014-03-04 DIAGNOSIS — Z713 Dietary counseling and surveillance: Secondary | ICD-10-CM | POA: Insufficient documentation

## 2014-03-05 NOTE — Progress Notes (Signed)
Patient was seen on 03/04/14 for the first of a series of three diabetes self-management courses at the [facility].  Current HbA1c: 15.3%  The following learning objectives were met by the patient during this class:  Describe diabetes  State some common risk factors for diabetes  Defines the role of glucose and insulin  Identifies type of diabetes and pathophysiology  Describe the relationship between diabetes and cardiovascular risk  State the members of the Healthcare Team  States the rationale for glucose monitoring  State when to test glucose  State their individual Target Range  State the importance of logging glucose readings  Describe how to interpret glucose readings  Identifies A1C target  Explain the correlation between A1c and eAG values  State symptoms and treatment of high blood glucose  State symptoms and treatment of low blood glucose  Explain proper technique for glucose testing  Identifies proper sharps disposal  Handouts given during class include:  Living Well with Diabetes book  Carb Counting and Meal Planning book  Meal Plan Card  Carbohydrate guide  Meal planning worksheet  Low Sodium Flavoring Tips  The diabetes portion plate  Y2O to eAG Conversion Chart  Diabetes Medications  Diabetes Recommended Care Schedule  Support Group  Diabetes Success Plan  Core Class Satisfaction Survey  Follow-Up Plan:  Due to insurance restrictions Kala will not be attending all three classes. She will return for 1:1 with RD, CDE.

## 2014-03-11 ENCOUNTER — Ambulatory Visit: Payer: Self-pay

## 2014-03-18 ENCOUNTER — Ambulatory Visit: Payer: Self-pay

## 2014-03-22 ENCOUNTER — Ambulatory Visit: Payer: Medicare Other | Admitting: *Deleted

## 2014-04-15 ENCOUNTER — Ambulatory Visit: Payer: Medicare Other | Admitting: *Deleted

## 2015-08-05 ENCOUNTER — Encounter (HOSPITAL_COMMUNITY): Payer: Self-pay | Admitting: Emergency Medicine

## 2015-08-05 ENCOUNTER — Emergency Department (HOSPITAL_COMMUNITY)
Admission: EM | Admit: 2015-08-05 | Discharge: 2015-08-05 | Disposition: A | Discharge status: Home / Self Care | Payer: No Typology Code available for payment source | Attending: Emergency Medicine | Admitting: Emergency Medicine

## 2015-08-05 DIAGNOSIS — S8001XA Contusion of right knee, initial encounter: Secondary | ICD-10-CM | POA: Insufficient documentation

## 2015-08-05 DIAGNOSIS — G8929 Other chronic pain: Secondary | ICD-10-CM | POA: Diagnosis not present

## 2015-08-05 DIAGNOSIS — R58 Hemorrhage, not elsewhere classified: Secondary | ICD-10-CM

## 2015-08-05 DIAGNOSIS — Z7982 Long term (current) use of aspirin: Secondary | ICD-10-CM | POA: Insufficient documentation

## 2015-08-05 DIAGNOSIS — I10 Essential (primary) hypertension: Secondary | ICD-10-CM | POA: Diagnosis not present

## 2015-08-05 DIAGNOSIS — S199XXA Unspecified injury of neck, initial encounter: Secondary | ICD-10-CM | POA: Insufficient documentation

## 2015-08-05 DIAGNOSIS — E119 Type 2 diabetes mellitus without complications: Secondary | ICD-10-CM | POA: Insufficient documentation

## 2015-08-05 DIAGNOSIS — Y9389 Activity, other specified: Secondary | ICD-10-CM | POA: Insufficient documentation

## 2015-08-05 DIAGNOSIS — S299XXA Unspecified injury of thorax, initial encounter: Secondary | ICD-10-CM | POA: Diagnosis not present

## 2015-08-05 DIAGNOSIS — Y998 Other external cause status: Secondary | ICD-10-CM | POA: Diagnosis not present

## 2015-08-05 DIAGNOSIS — S3992XA Unspecified injury of lower back, initial encounter: Secondary | ICD-10-CM | POA: Diagnosis present

## 2015-08-05 DIAGNOSIS — Z792 Long term (current) use of antibiotics: Secondary | ICD-10-CM | POA: Insufficient documentation

## 2015-08-05 DIAGNOSIS — Z794 Long term (current) use of insulin: Secondary | ICD-10-CM | POA: Diagnosis not present

## 2015-08-05 DIAGNOSIS — Y92410 Unspecified street and highway as the place of occurrence of the external cause: Secondary | ICD-10-CM | POA: Diagnosis not present

## 2015-08-05 DIAGNOSIS — S39012A Strain of muscle, fascia and tendon of lower back, initial encounter: Secondary | ICD-10-CM | POA: Diagnosis not present

## 2015-08-05 DIAGNOSIS — Z862 Personal history of diseases of the blood and blood-forming organs and certain disorders involving the immune mechanism: Secondary | ICD-10-CM | POA: Insufficient documentation

## 2015-08-05 DIAGNOSIS — E785 Hyperlipidemia, unspecified: Secondary | ICD-10-CM | POA: Insufficient documentation

## 2015-08-05 DIAGNOSIS — Z79899 Other long term (current) drug therapy: Secondary | ICD-10-CM | POA: Diagnosis not present

## 2015-08-05 MED ORDER — CYCLOBENZAPRINE HCL 10 MG PO TABS
5.0000 mg | ORAL_TABLET | Freq: Once | ORAL | Status: AC
  Administered 2015-08-05: 5 mg via ORAL
  Filled 2015-08-05: qty 1

## 2015-08-05 MED ORDER — CYCLOBENZAPRINE HCL 5 MG PO TABS: 5.0000 mg | ORAL_TABLET | Freq: Three times a day (TID) | ORAL | Status: DC

## 2015-08-05 MED ORDER — IBUPROFEN 600 MG PO TABS: 600.0000 mg | ORAL_TABLET | Freq: Three times a day (TID) | ORAL | Status: DC

## 2015-08-05 MED ORDER — IBUPROFEN 200 MG PO TABS
600.0000 mg | ORAL_TABLET | Freq: Once | ORAL | Status: AC
  Administered 2015-08-05: 600 mg via ORAL
  Filled 2015-08-05: qty 3
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
## 2016-01-31 ENCOUNTER — Encounter: Payer: Self-pay | Admitting: Gastroenterology

## 2016-03-21 ENCOUNTER — Encounter: Payer: Medicare Other | Admitting: Gastroenterology

## 2016-04-17 ENCOUNTER — Ambulatory Visit: Payer: Medicare Other | Admitting: *Deleted

## 2016-04-18 ENCOUNTER — Encounter: Payer: Self-pay | Admitting: Primary Care

## 2016-05-01 ENCOUNTER — Encounter: Payer: Medicare Other | Admitting: Gastroenterology

## 2016-09-26 ENCOUNTER — Other Ambulatory Visit: Payer: Self-pay | Admitting: Primary Care

## 2016-09-26 DIAGNOSIS — Z1231 Encounter for screening mammogram for malignant neoplasm of breast: Secondary | ICD-10-CM

## 2016-10-11 ENCOUNTER — Ambulatory Visit: Payer: Medicare Other | Admitting: Neurology

## 2016-10-24 ENCOUNTER — Ambulatory Visit: Payer: Medicare Other | Admitting: Neurology

## 2016-11-14 ENCOUNTER — Ambulatory Visit (INDEPENDENT_AMBULATORY_CARE_PROVIDER_SITE_OTHER): Payer: Medicare Other | Admitting: Neurology

## 2016-11-14 ENCOUNTER — Encounter: Payer: Self-pay | Admitting: Neurology

## 2016-11-14 DIAGNOSIS — IMO0002 Reserved for concepts with insufficient information to code with codable children: Secondary | ICD-10-CM | POA: Insufficient documentation

## 2016-11-14 DIAGNOSIS — G43709 Chronic migraine without aura, not intractable, without status migrainosus: Secondary | ICD-10-CM | POA: Diagnosis not present

## 2016-11-14 DIAGNOSIS — G4730 Sleep apnea, unspecified: Secondary | ICD-10-CM | POA: Diagnosis not present

## 2016-11-14 MED ORDER — RIZATRIPTAN BENZOATE 10 MG PO TBDP: 10.0000 mg | ORAL_TABLET | ORAL | 6 refills | Status: DC | PRN

## 2016-11-14 MED ORDER — NORTRIPTYLINE HCL 25 MG PO CAPS: 25.0000 mg | ORAL_CAPSULE | Freq: Every day | ORAL | 11 refills | Status: DC

## 2016-11-14 MED ORDER — ONDANSETRON 4 MG PO TBDP
4.0000 mg | ORAL_TABLET | Freq: Three times a day (TID) | ORAL | 6 refills | Status: DC | PRN
Start: 2016-11-14 — End: 2019-10-21

## 2016-11-14 NOTE — Progress Notes (Signed)
PATIENT: Christina Webster DOB: [birth year]  Chief Complaint  Patient presents with  . Migraine    Reports being diagnosed with migraines at age 55.  Her migraines are more frequent now and are associated with dizziness, nausea and sensitivity to light.  She is using sumatriptan nasal spray which works well for her.  She has never been on a preventive medication.  Marland Kitchen PCP    Kerin Perna, NP     HISTORICAL  Christina Webster is a 55 years old right-handed female, seen in refer by  her primary care nurse practitioner Kerin Perna for evaluation of frequent migraine headaches, initial evaluation was on November 14 2016   She had a past medical history of hypertension, hyperlipidemia, diabetes, fibromyalgia, depression anxiety, obesity, on disability.  She reported history of migraine since age 67, her typical migraine are bilateral frontal temporal region pressure headaches with associated light noise sensitivity, she reported increased headache recently, about once a month, but lasting 3 days sometimes up to 3 weeks, she has tried over-the-counter medications ibuprofen, Tylenol, BC powder, Excedrin Migraine with limited help, in between her severe typical migraine headaches, she also have almost daily nagging frontal temporal region pressure headaches,  She was giving the prescription of Imitrex nasal spray 5 mg as needed she has tried it about twice in one month, it helped ease up her headache, but not taking it away, she does complains of nausea during the headaches,  She also complains of fatigue, snoring, frequent awakening, daytime sleepiness and fatigue.   REVIEW OF SYSTEMS: Full 14 system review of systems performed and notable only for fatigue, chest pain, hearing loss, ringing ears, blurry vision, snoring, feeling hot, increased thirst, joint pain, swelling, cramps, achy muscles, memory loss, confusion, headaches, numbness, weakness, dizziness, insomnia, snoring, restless leg,  depression anxiety not enough sleep   ALLERGIES: No Known Allergies  HOME MEDICATIONS: Current Outpatient Prescriptions  Medication Sig Dispense Refill  . aspirin EC 81 MG tablet Take 81 mg by mouth daily.    . DULoxetine (CYMBALTA) 60 MG capsule Take 60 mg by mouth daily.    . furosemide (LASIX) 20 MG tablet Take 20 mg by mouth daily.    Marland Kitchen glipiZIDE (GLUCOTROL) 10 MG tablet Take 10 mg by mouth daily before breakfast.    . INVOKAMET [phone] MG TABS TK 1 T PO  BID WITH MEALS  3  . losartan-hydrochlorothiazide (HYZAAR) 50-12.5 MG tablet TK 1 T PO D  3  . LYRICA 100 MG capsule TK ONE C PO  BID  3  . metoprolol (LOPRESSOR) 50 MG tablet Take 50 mg by mouth 2 (two) times daily with a meal.    . NOVOLOG MIX 70/30 FLEXPEN (70-30) 100 UNIT/ML FlexPen INJECT 22 UNITS QAM AND 18 UNITS QHS  3  . pravastatin (PRAVACHOL) 40 MG tablet Take 40 mg by mouth daily.    . SUMAtriptan (IMITREX) 5 MG/ACT nasal spray as needed.  3   No current facility-administered medications for this visit.     PAST MEDICAL HISTORY: Past Medical History:  Diagnosis Date  . Anemia   . Anxiety and depression   . Chronic back pain   . Diabetic peripheral neuropathy ([place])   . Fibromyalgia   . Hypercholesteremia   . Hypertension   . Migraines    "get them maybe couple times/wk" (12/07/2013)  . Type II diabetes mellitus ([place])     PAST SURGICAL HISTORY: Past Surgical History:  Procedure Laterality Date  .  TUBAL LIGATION  1988    FAMILY HISTORY: Family History  Problem Relation Age of Onset  . Hypertension Other   . Diabetes Other   . Cancer Other   . Hyperlipidemia Other   . Sleep apnea Other   . Breast cancer Mother   . Diabetes Mother   . Hypertension Mother   . Other Father     Unsure of medical history.  . Breast cancer Maternal Aunt   . Breast cancer Cousin     SOCIAL HISTORY:  Social History   Social History  . Marital status: Single    Spouse name: N/A  . Number of children: 3  . Years  of education: HS   Occupational History  . Disbabled    Social History Main Topics  . Smoking status: Never Smoker  . Smokeless tobacco: Never Used  . Alcohol use Yes     Comment: approx one drink every 3 months  . Drug use: No  . Sexual activity: Yes   Other Topics Concern  . Not on file   Social History Narrative   Lives at home with her daughter and two grandchildren.   Right-handed.   Occasional cup of coffee.     PHYSICAL EXAM   Vitals:   11/14/16 1054  BP: 110/68  Pulse: 81  Weight: 204 lb (92.5 kg)  Height: 5' (1.524 m)    Not recorded      Body mass index is 39.84 kg/m.  PHYSICAL EXAMNIATION:  Gen: NAD, conversant, well nourised, obese, well groomed                     Cardiovascular: Regular rate rhythm, no peripheral edema, warm, nontender. Eyes: Conjunctivae clear without exudates or hemorrhage Neck: Supple, no carotid bruits. Pulmonary: Clear to auscultation bilaterally   NEUROLOGICAL EXAM:  MENTAL STATUS: Speech:    Speech is normal; fluent and spontaneous with normal comprehension.  Cognition:     Orientation to time, place and person     Normal recent and remote memory     Normal Attention span and concentration     Normal Language, naming, repeating,spontaneous speech     Fund of knowledge   CRANIAL NERVES: CN II: Visual fields are full to confrontation. Fundoscopic exam is normal with sharp discs and no vascular changes. Pupils are round equal and briskly reactive to light. CN III, IV, VI: extraocular movement are normal. No ptosis. CN V: Facial sensation is intact to pinprick in all 3 divisions bilaterally. Corneal responses are intact.  CN VII: Face is symmetric with normal eye closure and smile. CN VIII: Hearing is normal to rubbing fingers CN IX, X: Palate elevates symmetrically. Phonation is normal. CN XI: Head turning and shoulder shrug are intact CN XII: Tongue is midline with normal movements and no atrophy.  MOTOR: There  is no pronator drift of out-stretched arms. Muscle bulk and tone are normal. Muscle strength is normal.  REFLEXES: Reflexes are 2+ and symmetric at the biceps, triceps, knees, and ankles. Plantar responses are flexor.  SENSORY: Intact to light touch, pinprick, positional sensation and vibratory sensation are intact in fingers and toes.  COORDINATION: Rapid alternating movements and fine finger movements are intact. There is no dysmetria on finger-to-nose and heel-knee-shin.    GAIT/STANCE: Posture is normal. Gait is steady with normal steps, base, arm swing, and turning. Heel and toe walking are normal. Tandem gait is normal.  Romberg is absent.   DIAGNOSTIC DATA (LABS, IMAGING, TESTING) -  I reviewed patient records, labs, notes, testing and imaging myself where available.   ASSESSMENT AND PLAN  Christina Webster is a 55 y.o. female    Chronic migraine headaches   Tried preventive medication nortriptyline 25 mg every night, titrating to 2 tablets every night  Maxalt 10 mg as needed Chronic sleepiness, fatigue   possible obstructive sleep apnea  Refer her to sleep study   Marcial Pacas, M.D. Ph.D.  [facility] [address] [place], [place] [address] Ph: [phone] Fax: [phone]  RZ:[id number] P Edwards, NP

## 2016-11-27 ENCOUNTER — Encounter: Payer: Self-pay | Admitting: *Deleted

## 2016-12-24 ENCOUNTER — Institutional Professional Consult (permissible substitution): Payer: Medicare Other | Admitting: Neurology

## 2016-12-24 ENCOUNTER — Telehealth: Payer: Self-pay

## 2016-12-24 NOTE — Telephone Encounter (Signed)
I spoke to patient and rescheduled her appt for today, provider out sick. Patient r/s for 1/23.

## 2017-01-08 ENCOUNTER — Encounter: Payer: Self-pay | Admitting: Neurology

## 2017-01-08 ENCOUNTER — Ambulatory Visit (INDEPENDENT_AMBULATORY_CARE_PROVIDER_SITE_OTHER): Payer: Medicare Other | Admitting: Neurology

## 2017-01-08 VITALS — BP 110/62 | HR 76 | Ht 60.0 in | Wt 240.0 lb

## 2017-01-08 DIAGNOSIS — F39 Unspecified mood [affective] disorder: Secondary | ICD-10-CM

## 2017-01-08 DIAGNOSIS — M7989 Other specified soft tissue disorders: Secondary | ICD-10-CM

## 2017-01-08 DIAGNOSIS — R51 Headache: Secondary | ICD-10-CM

## 2017-01-08 DIAGNOSIS — R519 Headache, unspecified: Secondary | ICD-10-CM

## 2017-01-08 DIAGNOSIS — R0683 Snoring: Secondary | ICD-10-CM

## 2017-01-08 DIAGNOSIS — R400 Somnolence: Secondary | ICD-10-CM

## 2017-01-08 NOTE — Patient Instructions (Signed)

## 2017-01-08 NOTE — Progress Notes (Signed)
Subjective:    Patient ID: Christina Webster is a 56 y.o. female.  HPI     Star Age, MD, PhD [facility] [address]O. [place], [place] [address]  Dear Aliene Beams,   I saw your patient, Christina Webster, upon your kind request in my clinic today for initial consultation of her sleep disorder, in particular, concern for obstructive sleep apnea. The patient is uncompanied today. As you know, Mr. Christina Webster is a 56 year old right-handed woman with an underlying medical history of migraines, hypertension, hyperlipidemia, fibromyalgia, depression, anxiety, diabetes, and morbid obesity, who reports snoring and excessive daytime somnolence. I reviewed your office note from 11/14/2016. Her Epworth sleepiness score is 9 out of 24 today, her fatigue score is 16 out of 63. She is a nonsmoker, drinks alcohol infrequently, denies illicit drug use and drinks caffeine occasionally, not daily. She is single and lives with one of her daughters, has 3 daughters, does not work, used to work in a [employer]. She denies morning headaches and headaches have improved since she started nortriptyline. She takes one pill at night. She has difficulty maintaining sleep. She denies restless leg symptoms and has occasional nightmares. She has a family history of obstructive sleep apnea in her mother who uses a CPAP machine. Patient's bedtime is around 9 or 10 PM, wakeup time is around 5:30 AM.  Her Past Medical History Is Significant For: Past Medical History:  Diagnosis Date  . Anemia   . Anxiety and depression   . Chronic back pain   . Diabetic peripheral neuropathy ([place])   . Fibromyalgia   . Hypercholesteremia   . Hypertension   . Migraines    "get them maybe couple times/wk" (12/07/2013)  . Type II diabetes mellitus ([place])     Her Past Surgical History Is Significant For: Past Surgical History:  Procedure Laterality Date  . TUBAL LIGATION  1988    Her Family History Is  Significant For: Family History  Problem Relation Age of Onset  . Hypertension Other   . Diabetes Other   . Cancer Other   . Hyperlipidemia Other   . Sleep apnea Other   . Breast cancer Mother   . Diabetes Mother   . Hypertension Mother   . Other Father     Unsure of medical history.  . Breast cancer Maternal Aunt   . Breast cancer Cousin     Her Social History Is Significant For: Social History   Social History  . Marital status: Single    Spouse name: N/A  . Number of children: 3  . Years of education: HS   Occupational History  . Disbabled    Social History Main Topics  . Smoking status: Never Smoker  . Smokeless tobacco: Never Used  . Alcohol use Yes     Comment: approx one drink every 3 months  . Drug use: No  . Sexual activity: Yes   Other Topics Concern  . None   Social History Narrative   Lives at home with her daughter and two grandchildren.   Right-handed.   Occasional cup of coffee.    Her Allergies Are:  No Known Allergies:   Her Current Medications Are:  Outpatient Encounter Prescriptions as of 01/08/2017  Medication Sig  . aspirin EC 81 MG tablet Take 81 mg by mouth daily.  . DULoxetine (CYMBALTA) 60 MG capsule Take 60 mg by mouth daily.  . furosemide (LASIX) 20 MG tablet Take 20 mg by mouth  daily.  . glipiZIDE (GLUCOTROL) 10 MG tablet Take 10 mg by mouth daily before breakfast.  . INVOKAMET [phone] MG TABS TK 1 T PO  BID WITH MEALS  . losartan-hydrochlorothiazide (HYZAAR) 50-12.5 MG tablet TK 1 T PO D  . LYRICA 100 MG capsule TK ONE C PO  BID  . metoprolol (LOPRESSOR) 50 MG tablet Take 50 mg by mouth 2 (two) times daily with a meal.  . nortriptyline (PAMELOR) 25 MG capsule Take 1 capsule (25 mg total) by mouth at bedtime.  Marland Kitchen NOVOLOG MIX 70/30 FLEXPEN (70-30) 100 UNIT/ML FlexPen INJECT 22 UNITS QAM AND 18 UNITS QHS  . ondansetron (ZOFRAN ODT) 4 MG disintegrating tablet Take 1 tablet (4 mg total) by mouth every 8 (eight) hours as needed.  .  pravastatin (PRAVACHOL) 40 MG tablet Take 40 mg by mouth daily.  . rizatriptan (MAXALT-MLT) 10 MG disintegrating tablet Take 1 tablet (10 mg total) by mouth as needed. May repeat in 2 hours if needed  . SUMAtriptan (IMITREX) 5 MG/ACT nasal spray as needed.   No facility-administered encounter medications on file as of 01/08/2017.   :  Review of Systems:  Out of a complete 14 point review of systems, all are reviewed and negative with the exception of these symptoms as listed below: Review of Systems  Neurological:       Patient has trouble falling and staying asleep, snores, when the temperature is hot pt will wake up choking, sometimes wakes up feeling tired, daytime fatigue, sometimes takes naps.    Epworth Sleepiness Scale 0= would never doze 1= slight chance of dozing 2= moderate chance of dozing 3= high chance of dozing  Sitting and reading:0 Watching TV:3 Sitting inactive in a public place (ex. Theater or meeting):0 As a passenger in a car for an hour without a break:3 Lying down to rest in the afternoon:2 Sitting and talking to someone:0 Sitting quietly after lunch (no alcohol):1 In a car, while stopped in traffic:0 Total:9  Objective:  Neurologic Exam  Physical Exam Physical Examination:   Vitals:   01/08/17 0949  BP: 110/62  Pulse: 76    General Examination: The patient is a very pleasant 56 y.o. female in no acute distress. She appears well-developed and well-nourished and adequately groomed.   HEENT: Normocephalic, atraumatic, pupils are equal, round and reactive to light and accommodation. Extraocular tracking is good without limitation to gaze excursion or nystagmus noted. Normal smooth pursuit is noted. Hearing is grossly intact. Face is symmetric with normal facial animation and normal facial sensation. Speech is clear with no dysarthria noted. There is no hypophonia. There is no lip, neck/head, jaw or voice tremor. Neck is supple with full range of passive  and active motion. There are no carotid bruits on auscultation. Oropharynx exam reveals: mild mouth dryness, poor dental hygiene many teeth missing, and marked airway crowding, due to larger tongue, tonsils of 2-3+ bilaterally and larger uvula. Mallampati is class III. Tongue protrudes centrally and palate elevates symmetrically. Neck size is 18.5 inches.   Chest: Clear to auscultation without wheezing, rhonchi or crackles noted.  Heart: S1+S2+0, regular and normal without murmurs, rubs or gallops noted.   Abdomen: Soft, non-tender and non-distended with normal bowel sounds appreciated on auscultation.  Extremities: There is 1+ pitting edema in the distal lower extremities bilaterally. Pedal pulses are intact.  Skin: Warm and dry without trophic changes noted. There are no varicose veins.  Musculoskeletal: exam reveals no obvious joint deformities, tenderness or joint swelling or  erythema.   Neurologically:  Mental status: The patient is awake, alert and oriented in all 4 spheres. Her immediate and remote memory, attention, language skills and fund of knowledge are appropriate. There is no evidence of aphasia, agnosia, apraxia or anomia. Speech is clear with normal prosody and enunciation. Thought process is linear. Mood is normal and affect is normal.  Cranial nerves II - XII are as described above under HEENT exam. In addition: shoulder shrug is normal with equal shoulder height noted. Motor exam: Normal bulk, strength and tone is noted. There is no drift, tremor or rebound. Romberg is negative. Reflexes are 2+ throughout. Fine motor skills and coordination: intact with normal finger taps, normal hand movements, normal rapid alternating patting, normal foot taps and normal foot agility.  Cerebellar testing: No dysmetria or intention tremor on finger to nose testing. Heel to shin is unremarkable bilaterally. There is no truncal or gait ataxia.  Sensory exam: intact to light touch, pinprick,  vibration, temperature sense in the upper and lower extremities.  Gait, station and balance: She stands easily. No veering to one side is noted. No leaning to one side is noted. Posture is age-appropriate and stance is narrow based. Gait shows normal stride length and normal pace. No problems turning are noted. Tandem walk is unremarkable.    Assessment and Plan:  In summary, Jadesola Pennelope Bracken is a very pleasant 56 y.o.-year old female with an underlying medical history of migraines, hypertension, hyperlipidemia, fibromyalgia, depression, anxiety, diabetes, and morbid obesity, whose history and physical exam are concerning for obstructive sleep apnea (OSA).  I had a long chat with the patient about my findings and the diagnosis of OSA, its prognosis and treatment options. We talked about medical treatments, surgical interventions and non-pharmacological approaches. I explained in particular the risks and ramifications of untreated moderate to severe OSA, especially with respect to developing cardiovascular disease down the Road, including congestive heart failure, difficult to treat hypertension, cardiac arrhythmias, or stroke. Even type 2 diabetes has, in part, been linked to untreated OSA. Symptoms of untreated OSA include daytime sleepiness, memory problems, mood irritability and mood disorder such as depression and anxiety, lack of energy, as well as recurrent headaches, especially morning headaches. We talked about trying to maintain a healthy lifestyle in general, as well as the importance of weight control. I encouraged the patient to eat healthy, exercise daily and keep well hydrated, to keep a scheduled bedtime and wake time routine, to not skip any meals and eat healthy snacks in between meals. I advised the patient not to drive when feeling sleepy. I recommended the following at this time: sleep study with potential positive airway pressure titration. (We will score hypopneas at 4% and split the sleep  study into diagnostic and treatment portion, if the estimated. 2 hour AHI is >15/h).   I explained the sleep test procedure to the patient and also outlined possible surgical and non-surgical treatment options of OSA, including the use of a custom-made dental device (which would require a referral to a specialist dentist or oral surgeon), upper airway surgical options, such as pillar implants, radiofrequency surgery, tongue base surgery, and UPPP (which would involve a referral to an ENT surgeon). Rarely, jaw surgery such as mandibular advancement may be considered.  I also explained the CPAP treatment option to the patient, who indicated that she would be willing to try CPAP if the need arises. I explained the importance of being compliant with PAP treatment, not only for insurance purposes  but primarily to improve Her symptoms, and for the patient's long term health benefit, including to reduce Her cardiovascular risks. I answered all her questions today and the patient was in agreement. I would like to see her back after the sleep study is completed and encouraged her to call with any interim questions, concerns, problems or updates.   Thank you very much for allowing me to participate in the care of this nice patient. If I can be of any further assistance to you please do not hesitate to talk to me.   Sincerely,   Star Age, MD, PhD

## 2017-02-13 ENCOUNTER — Telehealth: Payer: Self-pay | Admitting: *Deleted

## 2017-02-13 ENCOUNTER — Ambulatory Visit: Payer: Medicare Other | Admitting: Neurology

## 2017-02-13 NOTE — Telephone Encounter (Signed)
No showed follow up appointment. 

## 2017-02-14 ENCOUNTER — Encounter: Payer: Self-pay | Admitting: Neurology

## 2017-03-25 ENCOUNTER — Telehealth: Payer: Self-pay

## 2017-03-25 NOTE — Telephone Encounter (Signed)
Called patient to schedule sleep study 3 times. Have not heard back.

## 2018-06-02 ENCOUNTER — Other Ambulatory Visit: Payer: Self-pay | Admitting: Family Medicine

## 2018-06-02 DIAGNOSIS — Z1231 Encounter for screening mammogram for malignant neoplasm of breast: Secondary | ICD-10-CM

## 2018-06-25 ENCOUNTER — Encounter: Payer: Self-pay | Admitting: Radiology

## 2018-06-25 ENCOUNTER — Ambulatory Visit
Admission: RE | Admit: 2018-06-25 | Discharge: 2018-06-25 | Disposition: A | Discharge status: Home / Self Care | Payer: Medicare Other | Source: Ambulatory Visit | Attending: Family Medicine | Admitting: Family Medicine

## 2018-06-25 DIAGNOSIS — Z1231 Encounter for screening mammogram for malignant neoplasm of breast: Secondary | ICD-10-CM

## 2018-06-27 ENCOUNTER — Encounter: Payer: Self-pay | Admitting: Gastroenterology

## 2018-08-19 ENCOUNTER — Encounter: Payer: Medicare Other | Admitting: Gastroenterology

## 2018-08-26 ENCOUNTER — Encounter: Payer: Self-pay | Admitting: Gastroenterology

## 2018-08-26 ENCOUNTER — Ambulatory Visit: Payer: Medicare Other

## 2018-08-26 VITALS — Ht 60.0 in | Wt 204.4 lb

## 2018-08-26 DIAGNOSIS — Z1211 Encounter for screening for malignant neoplasm of colon: Secondary | ICD-10-CM

## 2018-08-26 MED ORDER — NA SULFATE-K SULFATE-MG SULF 17.5-3.13-1.6 GM/177ML PO SOLN: 1.0000 | Freq: Once | ORAL | 0 refills | Status: AC

## 2018-08-26 NOTE — Progress Notes (Signed)
Per pt, no allergies to soy or egg products.Pt not taking any weight loss meds or using  O2 at home.  Pt refused emmi video. 

## 2018-09-02 ENCOUNTER — Encounter: Payer: Self-pay | Admitting: Gastroenterology

## 2018-09-02 ENCOUNTER — Ambulatory Visit (AMBULATORY_SURGERY_CENTER): Payer: Medicare Other | Admitting: Gastroenterology

## 2018-09-02 VITALS — BP 121/74 | HR 98 | Temp 98.0°F | Resp 18 | Ht 60.0 in | Wt 204.0 lb

## 2018-09-02 DIAGNOSIS — Z1211 Encounter for screening for malignant neoplasm of colon: Secondary | ICD-10-CM

## 2018-09-02 MED ORDER — SODIUM CHLORIDE 0.9 % IV SOLN: 500.0000 mL | Freq: Once | INTRAVENOUS | Status: DC

## 2018-09-02 MED ORDER — INSULIN REGULAR HUMAN 100 UNIT/ML IJ SOLN
6.0000 [IU] | Freq: Once | INTRAMUSCULAR | Status: AC
  Administered 2018-09-02: 6 [IU] via SUBCUTANEOUS

## 2018-09-02 NOTE — Op Note (Signed)
[place] Patient Name: Christina Webster Procedure Date: 09/02/2018 12:37 PM MRN: [id number] Endoscopist: Justice Britain , MD Age: 57 Referring MD:  Date of Birth: 23-Jul-1961 Gender: Female Account #: [id number] Procedure:                Colonoscopy Indications:              Screening for colorectal malignant neoplasm Medicines:                Monitored Anesthesia Care Procedure:                Pre-Anesthesia Assessment:                           - Prior to the procedure, a History and Physical                            was performed, and patient medications and                            allergies were reviewed. The patient's tolerance of                            previous anesthesia was also reviewed. The risks                            and benefits of the procedure and the sedation                            options and risks were discussed with the patient.                            All questions were answered, and informed consent                            was obtained. Prior Anticoagulants: The patient has                            taken aspirin. ASA Grade Assessment: III - A                            patient with severe systemic disease. After                            reviewing the risks and benefits, the patient was                            deemed in satisfactory condition to undergo the                            procedure.                           After obtaining informed consent, the colonoscope  was passed under direct vision. Throughout the                            procedure, the patient's blood pressure, pulse, and                            oxygen saturations were monitored continuously. The                            Colonoscope was introduced through the anus and                            advanced to the 2 cm into the ileum. The                            colonoscopy was performed without difficulty. The                  patient tolerated the procedure well. The quality                            of the bowel preparation was evaluated using the                            BBPS [facility] Bowel Preparation Scale) with scores                            of: Right Colon = 2 (minor amount of residual                            staining, small fragments of stool and/or opaque                            liquid, but mucosa seen well), Transverse Colon = 3                            (entire mucosa seen well with no residual staining,                            small fragments of stool or opaque liquid) and Left                            Colon = 3 (entire mucosa seen well with no residual                            staining, small fragments of stool or opaque                            liquid). The total BBPS score equals 8. The quality                            of the bowel preparation was good. Scope In: 12:48:31 PM Scope Out: 1:00:05 PM Scope Withdrawal Time: 0 hours  8 minutes 39 seconds  Total Procedure Duration: 0 hours 11 minutes 34 seconds  Findings:                 The digital rectal exam was abnormal. Pertinent                            negatives include no palpable rectal lesions.                           The ileocecal valve appeared normal.                           Normal mucosa was found in the entire colon.                           Non-bleeding non-thrombosed internal hemorrhoids                            were found during retroflexion and during                            endoscopy. The hemorrhoids were Grade I (internal                            hemorrhoids that do not prolapse). Complications:            No immediate complications. Estimated Blood Loss:     Estimated blood loss: none. Impression:               - Preparation of the colon was fair.                           - Abnormal digital rectal exam.                           - The examined portion of the ileum was normal.                            - Normal mucosa in the entire examined colon.                           - Non-bleeding non-thrombosed internal hemorrhoids. Recommendation:           - The patient will be observed post-procedure,                            until all discharge criteria are met.                           - Discharge patient to home.                           - Patient has a contact number available for                            emergencies. The signs and symptoms of potential  delayed complications were discussed with the                            patient. Return to normal activities tomorrow.                            Written discharge instructions were provided to the                            patient.                           - Resume previous diet.                           - Continue present medications.                           - Repeat colonoscopy in 10 years for screening                            purposes.                           - Return to referring physician as previously                            scheduled to continue to work on blood glucose                            control.                           - The findings and recommendations were discussed                            with the patient.                           - The findings and recommendations were discussed                            with the designated responsible adult. Justice Britain, MD 09/02/2018 1:05:49 PM

## 2018-09-02 NOTE — Progress Notes (Signed)
CBG rechecked at 12:15. Results were 349. Doctor made aware and Dr. Rush Landmark states patient can proceed with procedure.

## 2018-09-02 NOTE — Progress Notes (Signed)
No problems noted in the recovery room. Maw  Pt has an interpreter at the bedside through language services. maw

## 2018-09-02 NOTE — Progress Notes (Signed)
Pt's states no medical or surgical changes since previsit or office visit. 

## 2018-09-02 NOTE — Progress Notes (Signed)
Report given to PACU, vss 

## 2018-09-02 NOTE — Patient Instructions (Addendum)
YOU HAD AN ENDOSCOPIC PROCEDURE TODAY AT [facility]:   Refer to the procedure report that was given to you for any specific questions about what was found during the examination.  If the procedure report does not answer your questions, please call your gastroenterologist to clarify.  If you requested that your care partner not be given the details of your procedure findings, then the procedure report has been included in a sealed envelope for you to review at your convenience later.  YOU SHOULD EXPECT: Some feelings of bloating in the abdomen. Passage of more gas than usual.  Walking can help get rid of the air that was put into your GI tract during the procedure and reduce the bloating. If you had a lower endoscopy (such as a colonoscopy or flexible sigmoidoscopy) you may notice spotting of blood in your stool or on the toilet paper. If you underwent a bowel prep for your procedure, you may not have a normal bowel movement for a few days.  Please Note:  You might notice some irritation and congestion in your nose or some drainage.  This is from the oxygen used during your procedure.  There is no need for concern and it should clear up in a day or so.  SYMPTOMS TO REPORT IMMEDIATELY:   Following lower endoscopy (colonoscopy or flexible sigmoidoscopy):  Excessive amounts of blood in the stool  Significant tenderness or worsening of abdominal pains  Swelling of the abdomen that is new, acute  Fever of 100F or higher   For urgent or emergent issues, a gastroenterologist can be reached at any hour by calling [phone].   DIET:  We do recommend a small meal at first, but then you may proceed to your regular diet.  Drink plenty of fluids but you should avoid alcoholic beverages for 24 hours.  ACTIVITY:  You should plan to take it easy for the rest of today and you should NOT DRIVE or use heavy machinery until tomorrow (because of the sedation medicines used during the test).     FOLLOW UP: Our staff will call the number listed on your records the next business day following your procedure to check on you and address any questions or concerns that you may have regarding the information given to you following your procedure. If we do not reach you, we will leave a message.  However, if you are feeling well and you are not experiencing any problems, there is no need to return our call.  We will assume that you have returned to your regular daily activities without incident.  If any biopsies were taken you will be contacted by phone or by letter within the next 1-3 weeks.  Please call us at [phone] if you have not heard about the biopsies in 3 weeks.    SIGNATURES/CONFIDENTIALITY: You and/or your care partner have signed paperwork which will be entered into your electronic medical record.  These signatures attest to the fact that that the information above on your After Visit Summary has been reviewed and is understood.  Full responsibility of the confidentiality of this discharge information lies with you and/or your care-partner.   Handout was given to your care partner on  Hemorrhoids.  High fiber diet was also given to patient. Will see you in clinic(appointment in office) the conservative measure do not help with hemorrhoids. You may resume your current medications today. Repeat colonoscopy in 10 years for screening purposes. Return to referring physician as  previously scheduled to continue to work on blood glucose control. Your blood sugar was 344 in the recovery room. Please call if any questions or concerns.

## 2018-09-03 ENCOUNTER — Telehealth: Payer: Self-pay | Admitting: *Deleted

## 2018-09-03 NOTE — Telephone Encounter (Signed)
No answer for post procedure call back. Left message and will try to call back this afternoon. Sm

## 2019-05-25 ENCOUNTER — Other Ambulatory Visit: Payer: Self-pay | Admitting: Nurse Practitioner

## 2019-05-25 DIAGNOSIS — Z1231 Encounter for screening mammogram for malignant neoplasm of breast: Secondary | ICD-10-CM

## 2019-07-14 ENCOUNTER — Ambulatory Visit: Payer: Medicare Other

## 2019-10-14 ENCOUNTER — Other Ambulatory Visit: Payer: Self-pay | Admitting: Radiology

## 2019-10-19 ENCOUNTER — Encounter: Payer: Self-pay | Admitting: *Deleted

## 2019-10-19 DIAGNOSIS — D0511 Intraductal carcinoma in situ of right breast: Secondary | ICD-10-CM | POA: Insufficient documentation

## 2019-10-20 NOTE — Progress Notes (Signed)
[place]  [phone] Fax:([phone]) [phone]     ID: Christina Webster DOB: January 12, 1961  MR#: [id number]  FIE#:[id number]  Patient Care Team: Simona Huh, NP as PCP - General (Nurse Practitioner) Mauro Kaufmann, RN as Oncology Nurse Navigator Rockwell Germany, RN as Oncology Nurse Navigator Mansouraty, [name], MD as Consulting Physician (Gastroenterology) Magrinat, Virgie Dad, MD as Consulting Physician (Oncology) Kyung Rudd, MD as Consulting Physician (Radiation Oncology) Chauncey Cruel, MD OTHER MD:  CHIEF COMPLAINT: Ductal carcinoma in situ  CURRENT TREATMENT: Awaiting definitive surgery   HISTORY OF CURRENT ILLNESS: Christina Webster had routine screening mammography on 09/26/2019 showing a possible abnormality in the right breast. She underwent right diagnostic mammography with tomography at [facility] on 10/01/2019 showing: breast density category C; 1.4 cm linear calcifications in the right breast, upper-outer quadrant.  Accordingly on 10/14/2019 she proceeded to biopsy of the right breast area in question. 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 Prognostic indicators significant for: estrogen receptor, 100% positive with strong staining intensity and progesterone receptor, 0% negative.   The patient's subsequent history is as detailed below.   INTERVAL HISTORY: Christina Webster was evaluated in the multidisciplinary breast cancer clinic on 10/21/2019 accompanied by her daughter Christina Webster. Her case was also presented at the multidisciplinary breast cancer conference on the same day. At that time a preliminary plan was proposed: Breast conserving surgery, adjuvant radiation, antiestrogens, and genetics testing.   REVIEW OF SYSTEMS: There were no specific symptoms leading to the original mammogram, which was routinely scheduled. The patient denies unusual headaches, visual  changes, nausea, vomiting, stiff neck, dizziness, or gait imbalance. There has been no cough, phlegm production, or pleurisy, no chest pain or pressure, and no change in bowel or bladder habits. The patient denies fever, rash, bleeding, unexplained fatigue or unexplained weight loss. A detailed review of systems was otherwise entirely negative.   PAST MEDICAL HISTORY: Past Medical History:  Diagnosis Date  . Anemia   . Anxiety and depression   . Chronic back pain   . Diabetic peripheral neuropathy ([place])   . Fibromyalgia   . Hypercholesteremia   . Hypertension   . Migraines    "get them maybe couple times/wk" (12/07/2013)  . Type II diabetes mellitus ([place])     PAST SURGICAL HISTORY: Past Surgical History:  Procedure Laterality Date  . TUBAL LIGATION  1988    FAMILY HISTORY: Family History  Problem Relation Age of Onset  . Hypertension Other   . Diabetes Other   . Cancer Other   . Hyperlipidemia Other   . Sleep apnea Other   . Breast cancer Mother   . Diabetes Mother   . Hypertension Mother   . Other Father        Unsure of medical history.  . Breast cancer Maternal Aunt   . Breast cancer Cousin   . Healthy Sister   . Healthy Brother   . Healthy Sister    The patient has no information regarding her father or his side of the family.  Patient's mother is 86 years old as of November 2020.  The patient has 1 brother, 2 sisters.  The patient's mother had breast cancer at age 30.  There is also a maternal aunt and 2 maternal cousins with breast cancer.  GYNECOLOGIC HISTORY:  Patient's last menstrual period was 11/24/2013. Menarche: 58 years old Age at first live birth: 58 years old Christina Webster  P 3 LMP 2014 HRT no  Hysterectomy? no BSO? no   SOCIAL HISTORY: (updated 10/2019)  Zakira is disabled secondary to fibromyalgia. She is single. She lives at home with her daughters Christina Webster and Christina Webster and 4 grandchildren.  Daughter Christina Webster lives in [place] and currently is unemployed  but has worked at a call center.  Daughter Christina Webster lives in [place] and works as a Radiation protection practitioner and daughter Christina Webster also work lives in [place] and works as a Radiation protection practitioner.  The patient is not a church attender   ADVANCED DIRECTIVES: Not in place but she intends to name her daughter Christina Webster as healthcare power of attorney.  Christina Webster can be reached at [phone]   HEALTH MAINTENANCE: Social History   Tobacco Use  . Smoking status: Never Smoker  . Smokeless tobacco: Never Used  Substance Use Topics  . Alcohol use: Yes    Comment: rare  . Drug use: No     Colonoscopy: 08/2018 (Dr. Rush Landmark), repeat 2029  PAP: none on file  Bone density: none on file   No Known Allergies  Current Outpatient Medications  Medication Sig Dispense Refill  . aspirin EC 81 MG tablet Take 81 mg by mouth daily.    . calcium carbonate (OS-CAL) 1250 (500 Ca) MG chewable tablet Chew 1 tablet by mouth daily.    . Cyanocobalamin (VITAMIN B 12 PO) Take by mouth.    . cyclobenzaprine (FLEXERIL) 10 MG tablet Take 10 mg by mouth at bedtime as needed.    . DULoxetine (CYMBALTA) 60 MG capsule Take 60 mg by mouth daily. Take 2 pills daily    . Insulin Lispro Prot & Lispro (HUMALOG MIX 75/25 KWIKPEN) (75-25) 100 UNIT/ML Kwikpen Humalog Mix 75-25 KwikPen U-100 insulin 100 unit/mL subcutaneous pen    . losartan-hydrochlorothiazide (HYZAAR) 50-12.5 MG tablet TK 1 T PO D  3  . Magnesium 200 MG TABS SMARTSIG:2 Tablet(s) By Mouth Every Night    . SUMAtriptan (IMITREX) 5 MG/ACT nasal spray as needed.  3  . SYNJARDY XR 12.04-999 MG TB24 Take 2 tablets by mouth every morning.    . topiramate (TOPAMAX) 25 MG tablet Take 25 mg by mouth at bedtime.    . traMADol (ULTRAM) 50 MG tablet Take 50 mg by mouth 5 (five) times daily as needed.     No current facility-administered medications for this visit.     OBJECTIVE: Middle-aged African-American woman who appears stated age  16:   10/21/19 1249  BP: (!) 164/71  Pulse: (!) 101   Resp: 18  Temp: 98.5 F (36.9 C)  SpO2: 100%     Body mass index is 37.91 kg/m.   Wt Readings from Last 3 Encounters:  10/21/19 194 lb 1.6 oz (88 kg)  09/02/18 204 lb (92.5 kg)  08/26/18 204 lb 6.4 oz (92.7 kg)      ECOG FS:1 - Symptomatic but completely ambulatory  Ocular: Sclerae unicteric, pupils round and equal Ear-nose-throat: Wearing a mask Lymphatic: No cervical or supraclavicular adenopathy Lungs no rales or rhonchi Heart regular rate and rhythm Abd truncal obesity, with the abdomen soft, nontender, positive bowel sounds MSK no focal spinal tenderness, no joint edema Neuro: non-focal, well-oriented, appropriate affect Breasts: I do not palpate a mass in either breast.  Both axillae are benign.   LAB RESULTS:  CMP     Component Value Date/Time   NA 141 10/21/2019 1232   K 3.5 10/21/2019 1232   CL 102 10/21/2019 1232   CO2 23 10/21/2019 1232  GLUCOSE 149 (H) 10/21/2019 1232   BUN 25 (H) 10/21/2019 1232   CREATININE 1.37 (H) 10/21/2019 1232   CALCIUM 10.8 (H) 10/21/2019 1232   PROT 8.5 (H) 10/21/2019 1232   ALBUMIN 4.0 10/21/2019 1232   AST 14 (L) 10/21/2019 1232   ALT 25 10/21/2019 1232   ALKPHOS 86 10/21/2019 1232   BILITOT <0.2 (L) 10/21/2019 1232   GFRNONAA 43 (L) 10/21/2019 1232   GFRAA 49 (L) 10/21/2019 1232    No results found for: TOTALPROTELP, ALBUMINELP, A1GS, A2GS, BETS, BETA2SER, GAMS, MSPIKE, SPEI  No results found for: KPAFRELGTCHN, LAMBDASER, KAPLAMBRATIO  Lab Results  Component Value Date   WBC 19.8 (H) 10/21/2019   NEUTROABS 15.0 (H) 10/21/2019   HGB 11.3 (L) 10/21/2019   HCT 35.7 (L) 10/21/2019   MCV 78.6 (L) 10/21/2019   PLT 638 (H) 10/21/2019    '@LASTCHEMISTRY' @  No results found for: LABCA2  No components found for: JIRCVE938  No results for input(s): INR in the last 168 hours.  No results found for: LABCA2  No results found for: BOF751  No results found for: WCH852  No results found for: DPO242  No results found  for: CA2729  No components found for: HGQUANT  No results found for: CEA1 / No results found for: CEA1   No results found for: AFPTUMOR  No results found for: CHROMOGRNA  No results found for: PSA1  Appointment on 10/21/2019  Component Date Value Ref Range Status  . Sodium 10/21/2019 141  135 - 145 mmol/L Final  . Potassium 10/21/2019 3.5  3.5 - 5.1 mmol/L Final  . Chloride 10/21/2019 102  98 - 111 mmol/L Final  . CO2 10/21/2019 23  22 - 32 mmol/L Final  . Glucose, Bld 10/21/2019 149* 70 - 99 mg/dL Final  . BUN 10/21/2019 25* 6 - 20 mg/dL Final  . Creatinine 10/21/2019 1.37* 0.44 - 1.00 mg/dL Final  . Calcium 10/21/2019 10.8* 8.9 - 10.3 mg/dL Final  . Total Protein 10/21/2019 8.5* 6.5 - 8.1 g/dL Final  . Albumin 10/21/2019 4.0  3.5 - 5.0 g/dL Final  . AST 10/21/2019 14* 15 - 41 U/L Final  . ALT 10/21/2019 25  0 - 44 U/L Final  . Alkaline Phosphatase 10/21/2019 86  38 - 126 U/L Final  . Total Bilirubin 10/21/2019 <0.2* 0.3 - 1.2 mg/dL Final  . GFR, Est Non Af Am 10/21/2019 43* >60 mL/min Final  . GFR, Est AFR Am 10/21/2019 49* >60 mL/min Final  . Anion gap 10/21/2019 16* 5 - 15 Final   Performed at [facility], [place] [address] [place], [place] [address]  . WBC Count 10/21/2019 19.8* 4.0 - 10.5 K/uL Final  . RBC 10/21/2019 4.54  3.87 - 5.11 MIL/uL Final  . Hemoglobin 10/21/2019 11.3* 12.0 - 15.0 g/dL Final  . HCT 10/21/2019 35.7* 36.0 - 46.0 % Final  . MCV 10/21/2019 78.6* 80.0 - 100.0 fL Final  . MCH 10/21/2019 24.9* 26.0 - 34.0 pg Final  . MCHC 10/21/2019 31.7  30.0 - 36.0 g/dL Final  . RDW 10/21/2019 13.5  11.5 - 15.5 % Final  . Platelet Count 10/21/2019 638* 150 - 400 K/uL Final  . nRBC 10/21/2019 0.0  0.0 - 0.2 % Final  . Neutrophils Relative % 10/21/2019 75  % Final  . Neutro Abs 10/21/2019 15.0* 1.7 - 7.7 K/uL Final  . Lymphocytes Relative 10/21/2019 20  % Final  . Lymphs Abs 10/21/2019 3.9  0.7 - 4.0 K/uL Final  .  Monocytes Relative  10/21/2019 4  % Final  . Monocytes Absolute 10/21/2019 0.7  0.1 - 1.0 K/uL Final  . Eosinophils Relative 10/21/2019 0  % Final  . Eosinophils Absolute 10/21/2019 0.1  0.0 - 0.5 K/uL Final  . Basophils Relative 10/21/2019 0  % Final  . Basophils Absolute 10/21/2019 0.0  0.0 - 0.1 K/uL Final  . Immature Granulocytes 10/21/2019 1  % Final  . Abs Immature Granulocytes 10/21/2019 0.09* 0.00 - 0.07 K/uL Final   Performed at [facility], [address] [place], [place] [address]    (this displays the last labs from the last 3 days)  No results found for: TOTALPROTELP, ALBUMINELP, A1GS, A2GS, BETS, BETA2SER, GAMS, MSPIKE, SPEI (this displays SPEP labs)  No results found for: KPAFRELGTCHN, LAMBDASER, KAPLAMBRATIO (kappa/lambda light chains)  No results found for: HGBA, HGBA2QUANT, HGBFQUANT, HGBSQUAN (Hemoglobinopathy evaluation)   No results found for: LDH  No results found for: IRON, TIBC, IRONPCTSAT (Iron and TIBC)  No results found for: FERRITIN  Urinalysis    Component Value Date/Time   COLORURINE YELLOW 12/07/2013 1234   APPEARANCEUR TURBID (A) 12/07/2013 1234   LABSPEC 1.035 (H) 12/07/2013 1234   PHURINE 5.5 12/07/2013 1234   GLUCOSEU >1000 (A) 12/07/2013 1234   HGBUR MODERATE (A) 12/07/2013 1234   BILIRUBINUR NEGATIVE 12/07/2013 [place] 12/07/2013 1234   PROTEINUR 30 (A) 12/07/2013 1234   UROBILINOGEN 0.2 12/07/2013 1234   NITRITE NEGATIVE 12/07/2013 1234   LEUKOCYTESUR LARGE (A) 12/07/2013 1234     STUDIES: No results found.  ELIGIBLE FOR AVAILABLE RESEARCH PROTOCOL: Not a comment candidate  ASSESSMENT: 58 y.o. 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  PLAN: I spent approximately 60 minutes face to face with [facility] with more than 50% of that time spent in counseling and coordination of care. Specifically we reviewed the biology of the patient's diagnosis and the specifics of her situation.  Severa understands that in noninvasive ductal carcinoma, also called ductal carcinoma in situ ("DCIS") the breast cancer cells remain trapped in the ducts were they started. They cannot travel to a vital organ. For that reason these cancers in themselves are not life-threatening.  If the whole breast is removed then all the ducts are removed and since the cancer cells are trapped in the ducts, the cure rate with mastectomy for noninvasive breast cancer is approximately 99%. Nevertheless we recommend lumpectomy, because there is no survival advantage to mastectomy and because the cosmetic result is generally superior with breast conservation.  Since the patient is keeping her breast, there will be some risk of recurrence. The recurrence can only be in the same breast since, again, the cells are trapped in the ducts. There is no connection from one breast to the other. The risk of local recurrence is cut by more than half with radiation, which is standard in this situation.  In estrogen receptor positive cancers like Aime's, anti-estrogens can also be considered. They will further reduce the risk of recurrence by one half. In addition anti-estrogens will lower the risk of a new breast cancer developing in either breast, also by one half. That risk approaches 1% per year. Anti-estrogens reduce it to 1/2%.  Accordingly the  overall plan is for surgery, followed by radiation, then a discussion of anti-estrogens.  [place] does qualify for genetics testing. In patients who carry a deleterious mutation [for example in a  BRCA gene], the risk of a new breast cancer developing in the future may be sufficiently great that the patient may choose  bilateral mastectomies. However if she wishes to keep her breasts in that situation it is safe to do so. That would require intensified screening, which generally means not only yearly mammography but a yearly breast MRI as well.  She will meet with a genetics counselors today.  Oma has a good understanding of the overall plan. She agrees with it. She knows the goal of treatment in her case is cure. She will call with any problems that may develop before her next visit here.   Chauncey Cruel, MD   10/21/2019 2:31 PM Medical Oncology and Hematology [facility] [place], [place] [address] Tel. [phone]    Fax. [phone]   This document serves as a record of services personally performed by Lurline Del, MD. It was created on his behalf by Wilburn Mylar, a trained medical scribe. The creation of this record is based on the scribe's personal observations and the provider's statements to them.   I, Lurline Del MD, have reviewed the above documentation for accuracy and completeness, and I agree with the above.

## 2019-10-21 ENCOUNTER — Ambulatory Visit: Payer: Self-pay | Admitting: Surgery

## 2019-10-21 ENCOUNTER — Ambulatory Visit (HOSPITAL_BASED_OUTPATIENT_CLINIC_OR_DEPARTMENT_OTHER): Payer: Medicare Other | Admitting: Genetic Counselor

## 2019-10-21 ENCOUNTER — Ambulatory Visit: Payer: Medicare Other | Attending: Surgery | Admitting: Physical Therapy

## 2019-10-21 ENCOUNTER — Encounter: Payer: Self-pay | Admitting: Genetic Counselor

## 2019-10-21 ENCOUNTER — Encounter: Payer: Self-pay | Admitting: Physical Therapy

## 2019-10-21 ENCOUNTER — Other Ambulatory Visit: Payer: Self-pay

## 2019-10-21 ENCOUNTER — Inpatient Hospital Stay: Payer: Medicare Other

## 2019-10-21 ENCOUNTER — Ambulatory Visit
Admission: RE | Admit: 2019-10-21 | Discharge: 2019-10-21 | Disposition: A | Discharge status: Home / Self Care | Payer: Medicare Other | Source: Ambulatory Visit | Attending: Radiation Oncology | Admitting: Radiation Oncology

## 2019-10-21 ENCOUNTER — Inpatient Hospital Stay: Payer: Medicare Other | Attending: Oncology | Admitting: Oncology

## 2019-10-21 ENCOUNTER — Encounter: Payer: Self-pay | Admitting: Oncology

## 2019-10-21 VITALS — BP 164/71 | HR 101 | Temp 98.5°F | Resp 18 | Wt 194.1 lb

## 2019-10-21 DIAGNOSIS — Z170 Estrogen receptor positive status [ER+]: Secondary | ICD-10-CM

## 2019-10-21 DIAGNOSIS — F39 Unspecified mood [affective] disorder: Secondary | ICD-10-CM

## 2019-10-21 DIAGNOSIS — M797 Fibromyalgia: Secondary | ICD-10-CM | POA: Insufficient documentation

## 2019-10-21 DIAGNOSIS — G4730 Sleep apnea, unspecified: Secondary | ICD-10-CM | POA: Diagnosis not present

## 2019-10-21 DIAGNOSIS — D0591 Unspecified type of carcinoma in situ of right breast: Secondary | ICD-10-CM

## 2019-10-21 DIAGNOSIS — I10 Essential (primary) hypertension: Secondary | ICD-10-CM | POA: Diagnosis not present

## 2019-10-21 DIAGNOSIS — D0511 Intraductal carcinoma in situ of right breast: Secondary | ICD-10-CM | POA: Diagnosis not present

## 2019-10-21 DIAGNOSIS — R293 Abnormal posture: Secondary | ICD-10-CM | POA: Diagnosis present

## 2019-10-21 DIAGNOSIS — Z79899 Other long term (current) drug therapy: Secondary | ICD-10-CM | POA: Insufficient documentation

## 2019-10-21 DIAGNOSIS — Z800 Family history of malignant neoplasm of digestive organs: Secondary | ICD-10-CM | POA: Insufficient documentation

## 2019-10-21 DIAGNOSIS — E119 Type 2 diabetes mellitus without complications: Secondary | ICD-10-CM | POA: Insufficient documentation

## 2019-10-21 DIAGNOSIS — IMO0002 Reserved for concepts with insufficient information to code with codable children: Secondary | ICD-10-CM

## 2019-10-21 DIAGNOSIS — G43709 Chronic migraine without aura, not intractable, without status migrainosus: Secondary | ICD-10-CM | POA: Insufficient documentation

## 2019-10-21 DIAGNOSIS — Z803 Family history of malignant neoplasm of breast: Secondary | ICD-10-CM | POA: Insufficient documentation

## 2019-10-21 LAB — CBC WITH DIFFERENTIAL (CANCER CENTER ONLY)
Abs Immature Granulocytes: 0.09 10*3/uL — ABNORMAL HIGH (ref 0.00–0.07)
Basophils Absolute: 0 10*3/uL (ref 0.0–0.1)
Basophils Relative: 0 %
Eosinophils Absolute: 0.1 10*3/uL (ref 0.0–0.5)
Eosinophils Relative: 0 %
HCT: 35.7 % — ABNORMAL LOW (ref 36.0–46.0)
Hemoglobin: 11.3 g/dL — ABNORMAL LOW (ref 12.0–15.0)
Immature Granulocytes: 1 %
Lymphocytes Relative: 20 %
Lymphs Abs: 3.9 10*3/uL (ref 0.7–4.0)
MCH: 24.9 pg — ABNORMAL LOW (ref 26.0–34.0)
MCHC: 31.7 g/dL (ref 30.0–36.0)
MCV: 78.6 fL — ABNORMAL LOW (ref 80.0–100.0)
Monocytes Absolute: 0.7 10*3/uL (ref 0.1–1.0)
Monocytes Relative: 4 %
Neutro Abs: 15 10*3/uL — ABNORMAL HIGH (ref 1.7–7.7)
Neutrophils Relative %: 75 %
Platelet Count: 638 10*3/uL — ABNORMAL HIGH (ref 150–400)
RBC: 4.54 MIL/uL (ref 3.87–5.11)
RDW: 13.5 % (ref 11.5–15.5)
WBC Count: 19.8 10*3/uL — ABNORMAL HIGH (ref 4.0–10.5)
nRBC: 0 % (ref 0.0–0.2)

## 2019-10-21 LAB — CMP (CANCER CENTER ONLY)
ALT: 25 U/L (ref 0–44)
AST: 14 U/L — ABNORMAL LOW (ref 15–41)
Albumin: 4 g/dL (ref 3.5–5.0)
Alkaline Phosphatase: 86 U/L (ref 38–126)
Anion gap: 16 — ABNORMAL HIGH (ref 5–15)
BUN: 25 mg/dL — ABNORMAL HIGH (ref 6–20)
CO2: 23 mmol/L (ref 22–32)
Calcium: 10.8 mg/dL — ABNORMAL HIGH (ref 8.9–10.3)
Chloride: 102 mmol/L (ref 98–111)
Creatinine: 1.37 mg/dL — ABNORMAL HIGH (ref 0.44–1.00)
GFR, Est AFR Am: 49 mL/min — ABNORMAL LOW (ref >60)
GFR, Estimated: 43 mL/min — ABNORMAL LOW (ref >60)
Glucose, Bld: 149 mg/dL — ABNORMAL HIGH (ref 70–99)
Potassium: 3.5 mmol/L (ref 3.5–5.1)
Sodium: 141 mmol/L (ref 135–145)
Total Bilirubin: <0.2 mg/dL — ABNORMAL LOW (ref 0.3–1.2)
Total Protein: 8.5 g/dL — ABNORMAL HIGH (ref 6.5–8.1)

## 2019-10-21 NOTE — Therapy (Signed)
[place], [place], [address] Phone: [phone]   Fax:  [phone]  Physical Therapy Evaluation  Patient Details  Name: Christina Webster MRN: [id number] Date of Birth: 05/09/1961 Referring Provider (PT): Dr. [name]   Encounter Date: 10/21/2019  PT End of Session - 10/21/19 1401    Visit Number  1    Number of Visits  2    Date for PT Re-Evaluation  12/16/19    PT Start Time  1320    PT Stop Time  1344    PT Time Calculation (min)  24 min    Activity Tolerance  Patient tolerated treatment well    Behavior During Therapy  [facility] for tasks assessed/performed       Past Medical History:  Diagnosis Date  . Anemia   . Anxiety and depression   . Chronic back pain   . Diabetic peripheral neuropathy ([place])   . Fibromyalgia   . Hypercholesteremia   . Hypertension   . Migraines    "get them maybe couple times/wk" (12/07/2013)  . Type II diabetes mellitus ([place])     Past Surgical History:  Procedure Laterality Date  . TUBAL LIGATION  1988    There were no vitals filed for this visit.   Subjective Assessment - 10/21/19 1349    Subjective  Patient reports she is here today to be seen by her medical team for her newly diagnosed right breast cancer.    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 It measures 1.4 cm and is located in the upper outer quadrant. It is ER positive and PR negative with an abnormal appearing lymph node that will be biopsied on 10/26/2019.    Patient Stated Goals  Reduce lymphedema risk and learn post op shoulder ROM HEP    Currently in Pain?  Yes    Pain Score  6     Pain Location  --   arms, legs, back neck, "everywhere"   Pain Orientation  Right;Left;Upper;Lower    Pain Descriptors / Indicators  Aching    Pain Type  Chronic pain    Aggravating Factors   Rain and stress    Pain Relieving Factors  Unknown          [facility] PT Assessment - 10/21/19 0001      Assessment   Medical Diagnosis  Right breast cancer    Referring Provider (PT)  Dr. Marcello Moores Cornett    Onset Date/Surgical Date  09/26/19    Hand Dominance  Right    Prior Therapy  none      Precautions   Precautions  Other (comment)    Precaution Comments  active cancer      Restrictions   Weight Bearing Restrictions  No      Balance Screen   Has the patient fallen in the past 6 months  No    Has the patient had a decrease in activity level because of a fear of falling?   No    Is the patient reluctant to leave their home because of a fear of falling?   No      Home Environment   Living Environment  Private residence    Living Arrangements  Children   Adult daughter   Available Help at Discharge  Family      Prior Function   Level of Independence  Independent    Vocation  On  disability    Leisure  She tries to do some seated exercises      Cognition   Overall Cognitive Status  Within Functional Limits for tasks assessed      Posture/Postural Control   Posture/Postural Control  Postural limitations    Postural Limitations  Rounded Shoulders;Forward head      ROM / Strength   AROM / PROM / Strength  AROM;Strength      AROM   AROM Assessment Site  Shoulder;Cervical    Right/Left Shoulder  Right;Left    Right Shoulder Extension  40 Degrees    Right Shoulder Flexion  112 Degrees    Right Shoulder ABduction  132 Degrees    Right Shoulder Internal Rotation  60 Degrees    Right Shoulder External Rotation  73 Degrees    Left Shoulder Extension  43 Degrees    Left Shoulder Flexion  136 Degrees    Left Shoulder ABduction  130 Degrees    Left Shoulder Internal Rotation  77 Degrees    Left Shoulder External Rotation  58 Degrees    Cervical Flexion  WNL    Cervical Extension  25% limited    Cervical - Right Side Bend  WNL    Cervical - Left Side Bend  WNL    Cervical - Right Rotation  25% limited    Cervical - Left  Rotation  25% limited      Strength   Overall Strength  Unable to assess;Due to pain        LYMPHEDEMA/ONCOLOGY QUESTIONNAIRE - 10/21/19 1359      Type   Cancer Type  Right breast cancer      Lymphedema Assessments   Lymphedema Assessments  Upper extremities      Right Upper Extremity Lymphedema   10 cm Proximal to Olecranon Process  28.5 cm    Olecranon Process  25 cm    10 cm Proximal to Ulnar Styloid Process  21.1 cm    Just Proximal to Ulnar Styloid Process  16.3 cm    Across Hand at [employer]  19.5 cm    At [place] of 2nd Digit  6.7 cm      Left Upper Extremity Lymphedema   10 cm Proximal to Olecranon Process  29.1 cm    Olecranon Process  24.7 cm    10 cm Proximal to Ulnar Styloid Process  20.2 cm    Just Proximal to Ulnar Styloid Process  16.2 cm    Across Hand at [employer]  20.3 cm    At [place] of 2nd Digit  6.5 cm          Quick Dash - 10/21/19 0001    Open a tight or new jar  Moderate difficulty    Do heavy household chores (wash walls, wash floors)  Moderate difficulty    Carry a shopping bag or briefcase  No difficulty    Wash your back  Mild difficulty    Use a knife to cut food  No difficulty    Recreational activities in which you take some force or impact through your arm, shoulder, or hand (golf, hammering, tennis)  Moderate difficulty    During the past week, to what extent has your arm, shoulder or hand problem interfered with your normal social activities with family, friends, neighbors, or groups?  Quite a bit    During the past week, to what extent has your arm, shoulder or hand problem limited your work  or other regular daily activities  Quite a bit    Arm, shoulder, or hand pain.  Moderate    Tingling (pins and needles) in your arm, shoulder, or hand  Mild    Difficulty Sleeping  Moderate difficulty    DASH Score  40.91 %        Objective measurements completed on examination: See above findings.     Patient was instructed today  in a home exercise program today for post op shoulder range of motion. These included active assist shoulder flexion in sitting, scapular retraction, wall walking with shoulder abduction, and hands behind head external rotation.  She was encouraged to do these twice a day, holding 3 seconds and repeating 5 times when permitted by her physician.       PT Education - 10/21/19 1400    Education Details  Lymphedema risk reduction and post op shoulder ROM HEP    Person(s) Educated  Patient;Child(ren)    Methods  Explanation;Demonstration;Handout    Comprehension  Verbalized understanding;Returned demonstration          PT Long Term Goals - 10/21/19 1404      PT LONG TERM GOAL #1   Title  Patient will demonstrate she has regained full shoulder ROM and function post operatively compared to baselines.    Time  8    Period  Weeks    Status  New    Target Date  12/16/19      Breast Clinic Goals - 10/21/19 1404      Patient will be able to verbalize understanding of pertinent lymphedema risk reduction practices relevant to her diagnosis specifically related to skin care.   Time  1    Period  Days    Status  Achieved      Patient will be able to return demonstrate and/or verbalize understanding of the post-op home exercise program related to regaining shoulder range of motion.   Time  1    Period  Days    Status  Achieved      Patient will be able to verbalize understanding of the importance of attending the postoperative After Breast Cancer Class for further lymphedema risk reduction education and therapeutic exercise.   Time  1    Period  Days    Status  Achieved            Plan - 10/21/19 1402    Clinical Impression Statement  Patient was diagnosed on 09/26/2019 with right DCIS with concern for possible invasion. It measures 1.4 cm and is located in the upper outer quadrant. It is ER positive and PR negative with an abnormal appearing lymph node that will be biopsied on  10/26/2019. Her multidisciplinary medical team met prior to her assessments to determine a recommended treatment plan. She is planning to have a right lumpectomy and sentinel node biopsy followed by radiation and anti-estrogen therapy. She will benefit from a post op PT visit to determine needs.    Stability/Clinical Decision Making  Stable/Uncomplicated    Clinical Decision Making  Low    Rehab Potential  Good    PT Treatment/Interventions  ADLs/Self Care Home Management;Therapeutic exercise;Patient/family education    PT Next Visit Plan  Will reassess 3-4 weeks post op to determine needs    PT Home Exercise Plan  Post op shoulder ROM HEP    Consulted and Agree with Plan of Care  Patient;Family member/caregiver    Family Member Consulted  Daughter  Patient will benefit from skilled therapeutic intervention in order to improve the following deficits and impairments:  Postural dysfunction, Decreased range of motion, Decreased knowledge of precautions, Pain, Impaired UE functional use  Visit Diagnosis: Ductal carcinoma in situ (DCIS) of right breast - Plan: PT plan of care cert/re-cert  Abnormal posture - Plan: PT plan of care cert/re-cert   Patient will follow up at outpatient cancer rehab 3-4 weeks following surgery.  If the patient requires physical therapy at that time, a specific plan will be dictated and sent to the referring physician for approval. The patient was educated today on appropriate basic range of motion exercises to begin post operatively and the importance of attending the After Breast Cancer class following surgery.  Patient was educated today on lymphedema risk reduction practices as it pertains to recommendations that will benefit the patient immediately following surgery.  She verbalized good understanding.      Problem List Patient Active Problem List   Diagnosis Date Noted  . 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 Chronic migraine  11/14/2016  . Sleep apnea 11/14/2016  . Hyperglycemia 12/07/2013  . DM (diabetes mellitus) ([place]) 12/07/2013  . HTN (hypertension) 12/07/2013  . Fibromyalgia 12/07/2013  . UTI (urinary tract infection) 12/07/2013   Annia Friendly, PT 10/21/19 2:07 PM  [place], [place], [address] Phone: [phone]   Fax:  [phone]  Name: NATALEE TOMKIEWICZ MRN: [id number] Date of Birth: Dec 16, 1961

## 2019-10-21 NOTE — H&P (View-Only) (Signed)
Christina Webster Documented: 10/21/2019 12:10 PM Location: [facility] Patient #: [id number] DOB: 1961/07/07 Undefined / Language: Christina Webster / Race: Black or African American Female  History of Present Illness Christina Webster; 10/21/2019 3:23 PM) Patient words: Pt sent at the request of Dr [name] for mammographic abnormality of right breast / microcalcifications noted in upper outer quadrant. Core bx shows DCIS with microinvasion 1.4 cm ER pos PR negative  She denies breast pain discharge or mass   No family history of breast cancer.  The patient is a 58 year old female.   Medication History Christina Pummel, Christina Webster; 10/21/2019 12:10 PM) Medications Reconciled     Review of Systems (Christina Webster; 10/21/2019 3:23 PM) All other systems negative   Physical Exam (Christina Webster; 10/21/2019 3:24 PM)  General Mental Status-Alert. General Appearance-Consistent with stated age. Hydration-Well hydrated. Voice-Normal.  Breast Note: no masses bilaterally bruising noted RUOQ noted left breast normal no nipple discharge bilaterally  Cardiovascular Cardiovascular examination reveals -normal heart sounds, regular rate and rhythm with no murmurs and normal pedal pulses bilaterally.  Neurologic Neurologic evaluation reveals -alert and oriented x 3 with no impairment of recent or remote memory. Mental Status-Normal.  Musculoskeletal Normal Exam - Left-Upper Extremity Strength Normal and Lower Extremity Strength Normal. Normal Exam - Right-Upper Extremity Strength Normal and Lower Extremity Strength Normal.  Lymphatic Head & Neck  General Head & Neck Lymphatics: Bilateral - Description - Normal. Axillary  General Axillary Region: Bilateral - Description - Normal. Tenderness - Non Tender.    Assessment & Plan (Christina Webster; 10/21/2019 3:26 PM)  DUCTAL CARCINOMA IN SITU (DCIS) OF RIGHT BREAST WITH MICROINVASIVE COMPONENT  (D05.11) Impression: recommend right breast seed lumpectomy with SLN mapping  Risk of lumpectomy include bleeding, infection, seroma, more surgery, use of seed/wire, wound care, cosmetic deformity and the need for other treatments, death , blood clots, death. Pt agrees to proceed. Risk of sentinel lymph node mapping include bleeding, infection, lymphedema, shoulder pain. stiffness, dye allergy. cosmetic deformity , blood clots, death, need for more surgery. Pt agrees to proceed.  Current Plans You are being scheduled for surgery- Our schedulers will call you.  You should hear from our office's scheduling department within 5 working days about the location, date, and time of surgery. We try to make accommodations for patient's preferences in scheduling surgery, but sometimes the OR schedule or the surgeon's schedule prevents [place] from making those accommodations.  If you have not heard from our office [phone]) in 5 working days, call the office and ask for your surgeon's nurse.  If you have other questions about your diagnosis, plan, or surgery, call the office and ask for your surgeon's nurse.  We discussed the staging and pathophysiology of breast cancer. We discussed all of the different options for treatment for breast cancer including surgery, chemotherapy, radiation therapy, Herceptin, and antiestrogen therapy. We discussed a sentinel lymph node biopsy as she does not appear to having lymph node involvement right now. We discussed the performance of that with injection of radioactive tracer and blue dye. We discussed that she would have an incision underneath her axillary hairline. We discussed that there is a bout a 10-20% chance of having a positive node with a sentinel lymph node biopsy and we will await the permanent pathology to make any other first further decisions in terms of her treatment. One of these options might be to return to the operating room to perform an axillary lymph  node dissection. We discussed about a 1-2% risk lifetime of chronic shoulder pain as well as lymphedema associated with a sentinel lymph node biopsy. We discussed the options for treatment of the breast cancer which included lumpectomy versus a mastectomy. We discussed the performance of the lumpectomy with a wire placement. We discussed a 10-20% chance of a positive margin requiring reexcision in the operating room. We also discussed that she may need radiation therapy or antiestrogen therapy or both if she undergoes lumpectomy. We discussed the mastectomy and the postoperative care for that as well. We discussed that there is no difference in her survival whether she undergoes lumpectomy with radiation therapy or antiestrogen therapy versus a mastectomy. There is a slight difference in the local recurrence rate being 3-5% with lumpectomy and about 1% with a mastectomy. We discussed the risks of operation including bleeding, infection, possible reoperation. She understands her further therapy will be based on what her stages at the time of her operation.  Pt Education - Pamphlet Given - Breast Biopsy: discussed with patient and provided information. Pt Education - flb breast cancer surgery: discussed with patient and provided information. Pt Education - ABC (After Breast Cancer) Class Info: discussed with patient and provided information. Pt Education - [facility] Breast Pains Education

## 2019-10-21 NOTE — H&P (Signed)
Pecolia Ades Documented: 10/21/2019 12:10 PM Location: [facility] Patient #: [id number] DOB: 21-Jan-1961 Undefined / Language: Cleophus Molt / Race: Black or African American Female  History of Present Illness Marcello Moores A. Aparna Vanderweele MD; 10/21/2019 3:23 PM) Patient words: Pt sent at the request of Dr [name] for mammographic abnormality of right breast / microcalcifications noted in upper outer quadrant. Core bx shows DCIS with microinvasion 1.4 cm ER pos PR negative  She denies breast pain discharge or mass   No family history of breast cancer.  The patient is a 58 year old female.   Medication History Tawni Pummel, RN; 10/21/2019 12:10 PM) Medications Reconciled     Review of Systems (Taylon Louison A. Anael Rosch MD; 10/21/2019 3:23 PM) All other systems negative   Physical Exam (Hokulani Rogel A. Calven Gilkes MD; 10/21/2019 3:24 PM)  General Mental Status-Alert. General Appearance-Consistent with stated age. Hydration-Well hydrated. Voice-Normal.  Breast Note: no masses bilaterally bruising noted RUOQ noted left breast normal no nipple discharge bilaterally  Cardiovascular Cardiovascular examination reveals -normal heart sounds, regular rate and rhythm with no murmurs and normal pedal pulses bilaterally.  Neurologic Neurologic evaluation reveals -alert and oriented x 3 with no impairment of recent or remote memory. Mental Status-Normal.  Musculoskeletal Normal Exam - Left-Upper Extremity Strength Normal and Lower Extremity Strength Normal. Normal Exam - Right-Upper Extremity Strength Normal and Lower Extremity Strength Normal.  Lymphatic Head & Neck  General Head & Neck Lymphatics: Bilateral - Description - Normal. Axillary  General Axillary Region: Bilateral - Description - Normal. Tenderness - Non Tender.    Assessment & Plan (Gusta Marksberry A. 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 Pt agrees to proceed. Risk of sentinel lymph node mapping include bleeding, infection, lymphedema, shoulder pain. stiffness, dye allergy. cosmetic deformity , blood clots, death, need for more surgery. Pt agrees to proceed.  Current Plans You are being scheduled for surgery- Our schedulers will call you.  You should hear from our office's scheduling department within 5 working days about the location, date, and time of surgery. We try to make accommodations for patient's preferences in scheduling surgery, but sometimes the OR schedule or the surgeon's schedule prevents [place] from making those accommodations.  If you have not heard from our office [phone] in 5 working days, call the office and ask for your surgeon's nurse.  If you have other questions about your diagnosis, plan, or surgery, call the office and ask for your surgeon's nurse.  We discussed the staging and pathophysiology of breast cancer. We discussed all of the different options for treatment for breast cancer including surgery, chemotherapy, radiation therapy, Herceptin, and antiestrogen therapy. We discussed a sentinel lymph node biopsy as she does not appear to having lymph node involvement right now. We discussed the performance of that with injection of radioactive tracer and blue dye. We discussed that she would have an incision underneath her axillary hairline. We discussed that there is a bout a 10-20% chance of having a positive node with a sentinel lymph node biopsy and we will await the permanent pathology to make any other first further decisions in terms of her treatment. One of these options might be to return to the operating room to perform an axillary lymph  node dissection. We discussed about a 1-2% risk lifetime of chronic shoulder pain as well as lymphedema associated with a sentinel lymph node biopsy. We discussed the options for treatment of the breast cancer which included lumpectomy versus a mastectomy. We discussed the performance of the lumpectomy with a wire placement. We discussed a 10-20% chance of a positive margin requiring reexcision in the operating room. We also discussed that she may need radiation therapy or antiestrogen therapy or both if she undergoes lumpectomy. We discussed the mastectomy and the postoperative care for that as well. We discussed that there is no difference in her survival whether she undergoes lumpectomy with radiation therapy or antiestrogen therapy versus a mastectomy. There is a slight difference in the local recurrence rate being 3-5% with lumpectomy and about 1% with a mastectomy. We discussed the risks of operation including bleeding, infection, possible reoperation. She understands her further therapy will be based on what her stages at the time of her operation.  Pt Education - Pamphlet Given - Breast Biopsy: discussed with patient and provided information. Pt Education - flb breast cancer surgery: discussed with patient and provided information. Pt Education - ABC (After Breast Cancer) Class Info: discussed with patient and provided information. Pt Education - [facility] Breast Pains Education

## 2019-10-21 NOTE — Patient Instructions (Signed)

## 2019-10-21 NOTE — Progress Notes (Signed)
Radiation Oncology         [phone] ________________________________  Name: Christina Webster        MRN: [id number]  Date of Service: 10/21/2019 DOB: 1961-05-02  [id number], Milford Cage, NP  [name], MD     REFERRING PHYSICIAN: [name], MD   DIAGNOSIS: The encounter diagnosis was Ductal carcinoma in situ (DCIS) of right breast.   HISTORY OF PRESENT ILLNESS: Christina Webster is a 58 y.o. female seen in the multidisciplinary breast clinic for a new diagnosis of right breast cancer. She was found to have screening detected calcifications in the right breast. Diagnostic imaging revealed a group of calcifications measuring 1.4 cm in the upper outer quadrant. She also had 3 abnormal appearing lymph nodes. A biopsy on 10/14/2019 revealed at least intermediate grade DCIS with a focus of either HG DCIS versus focal invasive disease. Her tumor was ER positive, PR negative. She is going for a biopsy of her her axilla on 10/26/2019. She is seen today to discuss options of treatment for her cancer.   PREVIOUS RADIATION THERAPY: No   PAST MEDICAL HISTORY:  Past Medical History:  Diagnosis Date   Anemia    Anxiety and depression    Chronic back pain    Diabetic peripheral neuropathy (HCC)    Fibromyalgia    Hypercholesteremia    Hypertension    Migraines    "get them maybe couple times/wk" (12/07/2013)   Type II diabetes mellitus ([place])        PAST SURGICAL HISTORY: Past Surgical History:  Procedure Laterality Date   TUBAL LIGATION  1988     FAMILY HISTORY:  Family History  Problem Relation Age of Onset   Hypertension Other    Diabetes Other    Cancer Other    Hyperlipidemia Other    Sleep apnea Other    Breast cancer Mother    Diabetes Mother    Hypertension Mother    Other Father        Unsure of medical history.   Breast cancer Maternal Aunt    Breast cancer Cousin    Healthy Sister    Healthy Brother    Healthy Sister       SOCIAL HISTORY:  reports that she has never smoked. She has never used smokeless tobacco. She reports current alcohol use. She reports that she does not use drugs. The patient is single and lives in [place]. She is accompanied by her daughter.   ALLERGIES: Patient has no known allergies.   MEDICATIONS:  Current Outpatient Medications  Medication Sig Dispense Refill   aspirin EC 81 MG tablet Take 81 mg by mouth daily.     DULoxetine (CYMBALTA) 60 MG capsule Take 60 mg by mouth daily. Take 2 pills daily     furosemide (LASIX) 20 MG tablet Take 20 mg by mouth daily.     glipiZIDE (GLUCOTROL) 10 MG tablet Take 10 mg by mouth daily before breakfast.     INVOKAMET 574 [id number] 2907 MG TABS TK 1 T PO  BID WITH MEALS  3   losartan-hydrochlorothiazide (HYZAAR) 50-12.5 MG tablet TK 1 T PO D  3   LYRICA 100 MG capsule 150 mg 2 (two) times daily.   3   metoprolol (LOPRESSOR) 50 MG tablet Take 50 mg by mouth 2 (two) times daily with a meal.     nortriptyline (PAMELOR) 25 MG capsule Take 1 capsule (25 mg total) by mouth at bedtime. 60 capsule 11   NOVOLOG MIX  70/30 FLEXPEN (70-30) 100 UNIT/ML FlexPen Novolog insulin 32 units in am and 26 units in the evening  3   ondansetron (ZOFRAN ODT) 4 MG disintegrating tablet Take 1 tablet (4 mg total) by mouth every 8 (eight) hours as needed. 20 tablet 6   pravastatin (PRAVACHOL) 40 MG tablet Take 80 mg by mouth daily.      rizatriptan (MAXALT-MLT) 10 MG disintegrating tablet Take 1 tablet (10 mg total) by mouth as needed. May repeat in 2 hours if needed 15 tablet 6   SUMAtriptan (IMITREX) 5 MG/ACT nasal spray as needed.  3   No current facility-administered medications for this visit.      REVIEW OF SYSTEMS: On review of systems, the patient reports that she is doing well overall. She denies any chest pain, shortness of breath, cough, fevers, chills, night sweats, unintended weight changes. She denies any bowel or bladder disturbances, and denies  abdominal pain, nausea or vomiting. She denies any new musculoskeletal or joint aches or pains. A complete review of systems is obtained and is otherwise negative.     PHYSICAL EXAM:  Wt Readings from Last 3 Encounters:  09/02/18 204 lb (92.5 kg)  08/26/18 204 lb 6.4 oz (92.7 kg)  01/08/17 240 lb (108.9 kg)   Temp Readings from Last 3 Encounters:  09/02/18 98 F (36.7 C)  08/05/15 98.2 F (36.8 C) (Oral)  12/08/13 98.4 F (36.9 C) (Oral)   BP Readings from Last 3 Encounters:  09/02/18 121/74  01/08/17 110/62  11/14/16 110/68   Pulse Readings from Last 3 Encounters:  09/02/18 98  01/08/17 76  11/14/16 81   In general this is a well appearing African American female in no acute distress. She's alert and oriented x4 and appropriate throughout the examination. Cardiopulmonary assessment is negative for acute distress and she exhibits normal effort. Bilateral breast exam is deferred.   ECOG = 0  0 - Asymptomatic (Fully active, able to carry on all predisease activities without restriction)  1 - Symptomatic but completely ambulatory (Restricted in physically strenuous activity but ambulatory and able to carry out work of a light or sedentary nature. For example, light housework, office work)  2 - Symptomatic, <50% in bed during the day (Ambulatory and capable of all self care but unable to carry out any work activities. Up and about more than 50% of waking hours)  3 - Symptomatic, >50% in bed, but not bedbound (Capable of only limited self-care, confined to bed or chair 50% or more of waking hours)  4 - Bedbound (Completely disabled. Cannot carry on any self-care. Totally confined to bed or chair)  5 - Death   Eustace Pen MM, Creech RH, Tormey DC, et al. [phone]). "Toxicity and response criteria of the [facility]". [place] Oncol. 5 (6): 649-55    LABORATORY DATA:  Lab Results  Component Value Date   WBC 20.8 (H) 12/07/2013   HGB 12.9 12/07/2013    HCT 38.6 12/07/2013   MCV 79.1 12/07/2013   PLT 593 (H) 12/07/2013   Lab Results  Component Value Date   NA 136 12/08/2013   K 4.0 12/08/2013   CL 97 12/08/2013   CO2 24 12/08/2013   Lab Results  Component Value Date   ALT 15 12/07/2013   AST 15 12/07/2013   ALKPHOS 118 (H) 12/07/2013   BILITOT 0.4 12/07/2013      RADIOGRAPHY: No results found.     IMPRESSION/PLAN: 1. At least intermediate grade DCIS  of the right breast. Dr. [name] discusses the pathology findings and reviews the nature of noninvasive and invasive breast disease. The consensus from the breast conference includes proceeding with biopsy of her lymph nodes followed by breast conservation with lumpectomy with probable sentinel node biopsy. She appears to be a good candidate for adjuvant radiotherapy and antiestrogen therapy to reduce the risk of recurrence. We discussed the risks, benefits, short, and long term effects of radiotherapy, and the patient is interested in proceeding. Dr. [name] discusses the delivery and logistics of radiotherapy and anticipates a course of about 6 1/2 weeks of radiotherapy. We will see her back about 2 weeks after surgery to discuss the simulation process and anticipate we starting radiotherapy about 4-6 weeks after surgery.  2. Possible genetic predisposition to malignancy. The patient is a candidate for genetic testing given her personal and family history. She was offered referral and she is interested in a referral which will be coordinated.   In a visit lasting 30 minutes, greater than 50% of the time was spent face to face discussing her case, and coordinating the patient's care.  The above documentation reflects my direct findings during this shared patient visit. Please see the separate note by Dr. [name] on this date for the remainder of the patient's plan of care.    Carola Rhine, PAC

## 2019-10-21 NOTE — Progress Notes (Signed)
REFERRING PROVIDER: Chauncey Cruel, MD [address] [place],  [place] [address]  PRIMARY PROVIDER:  Simona Huh, NP  PRIMARY REASON FOR VISIT:  1. Ductal carcinoma in situ (DCIS) of right breast   2. Family history of breast cancer   3. Family history of stomach cancer      I connected with Ms. Christina Webster on 10/21/2019 at 3:00 pm EDT by Webex video conference and verified that I am speaking with the correct person using two identifiers.   Patient location: clinic Provider location: office  HISTORY OF PRESENT ILLNESS:   Ms. Christina Webster, a 58 y.o. female, was seen for a [facility] cancer genetics consultation at the request of Dr. Jana Hakim due to a personal and family history of breast cancer.  Ms. Christina Webster presents to clinic today to discuss the possibility of a hereditary predisposition to cancer, genetic testing, and to further clarify her future cancer risks, as well as potential cancer risks for family members.   In 2020, at the age of 39, Ms. Christina Webster was diagnosed with DCIS of the right breast.   CANCER HISTORY:  Oncology History   No history exists.     RISK FACTORS:  Menarche was at age 7.  First live birth at age 58.  Ovaries intact: yes.  Hysterectomy: no.  Menopausal status: postmenopausal.  HRT use: 0 years. Colonoscopy: yes; no polyps in 2019. Mammogram within the last year: yes.   Past Medical History:  Diagnosis Date  . Anemia   . Anxiety and depression   . Chronic back pain   . Diabetic peripheral neuropathy ([place])   . Family history of breast cancer   . Family history of stomach cancer   . Fibromyalgia   . Hypercholesteremia   . Hypertension   . Migraines    "get them maybe couple times/wk" (12/07/2013)  . Type II diabetes mellitus ([place])     Past Surgical History:  Procedure Laterality Date  . TUBAL LIGATION  1988    Social History   Socioeconomic History  . Marital status: Single    Spouse name: Not on file  . Number of children: 3  .  Years of education: HS  . Highest education level: Not on file  Occupational History  . Occupation: Disbabled  Social Needs  . Financial resource strain: Not on file  . Food insecurity    Worry: Not on file    Inability: Not on file  . Transportation needs    Medical: Not on file    Non-medical: Not on file  Tobacco Use  . Smoking status: Never Smoker  . Smokeless tobacco: Never Used  Substance and Sexual Activity  . Alcohol use: Yes    Comment: rare  . Drug use: No  . Sexual activity: Yes  Lifestyle  . Physical activity    Days per week: Not on file    Minutes per session: Not on file  . Stress: Not on file  Relationships  . Social Herbalist on phone: Not on file    Gets together: Not on file    Attends religious service: Not on file    Active member of club or organization: Not on file    Attends meetings of clubs or organizations: Not on file    Relationship status: Not on file  Other Topics Concern  . Not on file  Social History Narrative   Lives at home with her daughter and two grandchildren.   Right-handed.  Occasional cup of coffee.     FAMILY HISTORY:  We obtained a detailed, 4-generation family history.  Significant diagnoses are listed below: Family History  Problem Relation Age of Onset  . Hypertension Other   . Diabetes Other   . Cancer Other   . Hyperlipidemia Other   . Sleep apnea Other   . Breast cancer Mother 38  . Diabetes Mother   . Hypertension Mother   . Other Father        Unsure of medical history.  . Breast cancer Maternal Aunt 60  . Breast cancer Cousin 8       maternal cousin  . Healthy Half-Sister   . Healthy Half-Brother   . Healthy Half-Sister   . Stomach cancer Maternal Aunt 74   Ms. Christina Webster has three daughters. She also has a maternal half-brother and two maternal half-sisters. None of these individuals have had cancer.  Ms. Christina Webster mother is currently living at 48, and has a history of breast cancer diagnosed  at age 10. She has four maternal aunts. One aunt had breast cancer diagnosed when she was 83, and one aunt had stomach cancer diagnosed at age 7. The aunt with breast cancer also had a daughter who was diagnosed with breast cancer at age 77. There are no other known diagnoses of cancer on the maternal side of the family.  Ms. Christina Webster does not have any information about her father or his side of the family.  Ms. Christina Webster is unaware of previous family history of genetic testing for hereditary cancer risks.   GENETIC COUNSELING ASSESSMENT: Ms. Christina Webster is a 58 y.o. female with a personal and family history of breast cancer which is somewhat suggestive of a hereditary cancer syndrome and predisposition to cancer. We, therefore, discussed and recommended the following at today's visit.   DISCUSSION: We discussed that 5 - 10% of breast cancer is hereditary, with most cases associated with BRCA1/2.  There are other genes that can be associated with hereditary breast cancer syndromes.  These include ATM, CHEK2, PALB2, etc.  We discussed that testing is beneficial for several reasons including knowing about other cancer risks, identifying potential screening and risk-reduction options that may be appropriate, and to understand if other family members could be at risk for cancer and allow them to undergo genetic testing.  We reviewed the characteristics, features and inheritance patterns of hereditary cancer syndromes. We also discussed genetic testing, including the appropriate family members to test, the process of testing, insurance coverage and turn-around-time for results. We discussed the implications of a negative, positive and/or variant of uncertain significant result. In order to get genetic test results in a timely manner so that Ms. Christina Webster can use these genetic test results for surgical decisions, we recommended Ms. Christina Webster pursue genetic testing for the [facility] Breast Cancer STAT panel. Once complete, we  recommend Ms. Christina Webster pursue reflex genetic testing to the Common Hereditary Cancers panel.   The STAT Breast cancer panel offered by [facility] includes sequencing and rearrangement analysis for the following 9 genes:  ATM, BRCA1, BRCA2, CDH1, CHEK2, PALB2, PTEN, STK11 and TP53.     The Common Hereditary Cancers Panel offered by [facility] includes sequencing and/or deletion duplication testing of the following 48 genes: APC, ATM, AXIN2, BARD1, BMPR1A, BRCA1, BRCA2, BRIP1, CDH1, CDK4, CDKN2A (p14ARF), CDKN2A (p16INK4a), CHEK2, CTNNA1, DICER1, EPCAM (Deletion/duplication testing only), GREM1 (promoter region deletion/duplication testing only), KIT, MEN1, MLH1, MSH2, MSH3, MSH6, MUTYH, NBN, NF1, NHTL1, PALB2, PDGFRA, PMS2, POLD1, POLE, PTEN,  RAD50, RAD51C, RAD51D, RNF43, SDHB, SDHC, SDHD, SMAD4, SMARCA4. STK11, TP53, TSC1, TSC2, and VHL.  The following genes were evaluated for sequence changes only: SDHA and HOXB13 c.251G>A variant only.   Based on Ms. Christina Webster personal and family history of cancer, she meets medical criteria for genetic testing. Despite that she meets criteria, she may still have an out of pocket cost.   PLAN: After considering the risks, benefits, and limitations, Ms. Christina Webster provided informed consent to pursue genetic testing and the blood sample was sent to [facility] for analysis of the Breast Cancer STAT panel + Common Hereditary Cancers panel. Results should be available within approximately one weeks' time, at which point they will be disclosed by telephone to Ms. Jones, as will any additional recommendations warranted by these results. Ms. Christina Webster will receive a summary of her genetic counseling visit and a copy of her results once available. This information will also be available in Epic.   Ms. Christina Webster questions were answered to her satisfaction today. Our contact information was provided should additional questions or concerns arise. Thank you for the referral and allowing [place] to  share in the care of your patient.   Clint Guy, MS, [facility] Certified Genetic Counselor [email or web address] Phone: [phone]  The patient was seen for a total of 15 minutes in face-to-face genetic counseling.  This patient was discussed with Drs. Magrinat, Lindi Adie and/or Burr Medico who agrees with the above.    _______________________________________________________________________ For Office Staff:  Number of people involved in session: 2 Was an Intern/ student involved with case: yes - [employer] intern [employer]

## 2019-10-22 ENCOUNTER — Encounter: Payer: Self-pay | Admitting: General Practice

## 2019-10-22 ENCOUNTER — Telehealth: Payer: Self-pay | Admitting: Oncology

## 2019-10-22 NOTE — Telephone Encounter (Signed)
I talk with patient regarding schedule  

## 2019-10-22 NOTE — Progress Notes (Signed)
[facility] Psychosocial Distress Screening Christina Webster presented to Breast Multidisciplinary Clinic to introduce [place] team/resources, completing distress screen per protocol.  The patient scored a 0 on the Psychosocial Distress Thermometer which indicates minimal distress.   ONCBCN DISTRESS SCREENING 10/22/2019  Screening Type Initial Screening  Distress experienced in past week (1-10) 0  Referral to support programs Yes    Follow up needed: No. Christina Webster declined visit from Support Team representative, but has full packet of [place] team/programming resources and knows to contact team if needs arise.   [place], [place], [facility] Pager [phone] Voicemail [phone]

## 2019-10-26 ENCOUNTER — Other Ambulatory Visit: Payer: Self-pay | Admitting: Radiology

## 2019-10-27 ENCOUNTER — Other Ambulatory Visit: Payer: Self-pay | Admitting: *Deleted

## 2019-10-27 DIAGNOSIS — D0511 Intraductal carcinoma in situ of right breast: Secondary | ICD-10-CM

## 2019-10-27 DIAGNOSIS — Z1379 Encounter for other screening for genetic and chromosomal anomalies: Secondary | ICD-10-CM | POA: Insufficient documentation

## 2019-10-27 NOTE — Progress Notes (Signed)
am

## 2019-10-29 ENCOUNTER — Telehealth: Payer: Self-pay | Admitting: *Deleted

## 2019-10-29 ENCOUNTER — Telehealth: Payer: Self-pay | Admitting: Genetic Counselor

## 2019-10-29 NOTE — Telephone Encounter (Signed)
Revealed negative genetic testing for the [facility] Breast Cancer STAT panel.  Discussed that we do not know why she has breast cancer or why there is cancer in the family.  It could be due to a different gene that we are not testing, or our current technology may not be able detect something.  It will be important for her to keep in contact with genetics to keep up with whether additional testing may be appropriate in the future.  She also has reflex genetic testing pending for the Common Hereditary Cancers panel.  I will reach out to her when those results are available.

## 2019-10-29 NOTE — Telephone Encounter (Signed)
Spoke to pt concerning [place] from 10/21/19. Denies questions or concerns regarding dx or treatment care plan. Encourage pt to call with needs. Contact information provided.

## 2019-10-29 NOTE — Progress Notes (Signed)
[facility][id number] Christina Webster, [place] [id number] [address] [place] [phone] Phone: [phone] Fax: [phone]      Your procedure is scheduled on November 18  Report to [facility] Main Entrance "A" at 1030 A.M., and check in at the Admitting office.  Call this number if you have problems the morning of surgery:  [phone]  Call [phone] if you have any questions prior to your surgery date Monday-Friday 8am-4pm    Remember:  Do not eat after midnight the night before your surgery  You may drink clear liquids until 0930 the morning of your surgery.   Clear liquids allowed are: Water, Non-Citrus Juices (without pulp), Carbonated Beverages, Clear Tea, Black Coffee Only, and Gatorade    Take these medicines the morning of surgery with A SIP OF WATER  cyclobenzaprine (FLEXERIL)  If needed DULoxetine (CYMBALTA) traMADol (ULTRAM) if needed  Follow your surgeon's instructions on when to stop Aspirin.  If no instructions were given by your surgeon then you will need to call the office to get those instructions.    7 days prior to surgery STOP taking any Aspirin (unless otherwise instructed by your surgeon), Aleve, Naproxen, Ibuprofen, Motrin, Advil, Goody's, BC's, all herbal medications, fish oil, and all vitamins.    WHAT DO I DO ABOUT MY DIABETES MEDICATION?   Marland Kitchen Do not take oral diabetes medicines (pills) the morning of surgery. SYNJARDY   . THE NIGHT BEFORE SURGERY, take ____24_______ units of ___Insulin Lispro Prot & Lispro (HUMALOG MIX 75/25 KWIKPEN)________insulin.      . The day of surgery, do not take other diabetes injectables, including Byetta (exenatide), Bydureon (exenatide ER), Victoza (liraglutide), or Trulicity (dulaglutide).  . If your CBG is greater than 220 mg/dL, you may take  of your sliding scale (correction) dose of insulin.   How to Manage Your Diabetes Before and After  Surgery  Why is it important to control my blood sugar before and after surgery? . Improving blood sugar levels before and after surgery helps healing and can limit problems. . A way of improving blood sugar control is eating a healthy diet by: o  Eating less sugar and carbohydrates o  Increasing activity/exercise o  Talking with your doctor about reaching your blood sugar goals . High blood sugars (greater than 180 mg/dL) can raise your risk of infections and slow your recovery, so you will need to focus on controlling your diabetes during the weeks before surgery. . Make sure that the doctor who takes care of your diabetes knows about your planned surgery including the date and location.  How do I manage my blood sugar before surgery? . Check your blood sugar at least 4 times a day, starting 2 days before surgery, to make sure that the level is not too high or low. o Check your blood sugar the morning of your surgery when you wake up and every 2 hours until you get to the Short Stay unit. . If your blood sugar is less than 70 mg/dL, you will need to treat for low blood sugar: o Do not take insulin. o Treat a low blood sugar (less than 70 mg/dL) with  cup of clear juice (cranberry or apple), 4 glucose tablets, OR glucose gel. o Recheck blood sugar in 15 minutes after treatment (to make sure it is greater than 70 mg/dL). If your blood sugar is not greater than 70 mg/dL on recheck, call [phone] for  further instructions. . Report your blood sugar to the short stay nurse when you get to Short Stay.  . If you are admitted to the hospital after surgery: o Your blood sugar will be checked by the staff and you will probably be given insulin after surgery (instead of oral diabetes medicines) to make sure you have good blood sugar levels. o The goal for blood sugar control after surgery is 80-180 mg/dL.       The Morning of Surgery  Do not wear jewelry, make-up or nail polish.  Do not  wear lotions, powders, or perfumes/colognes, or deodorant  Do not shave 48 hours prior to surgery.  Men may shave face and neck.  Do not bring valuables to the hospital.  [facility] is not responsible for any belongings or valuables.  If you are a smoker, DO NOT Smoke 24 hours prior to surgery IF you wear a CPAP at night please bring your mask, tubing, and machine the morning of surgery   Remember that you must have someone to transport you home after your surgery, and remain with you for 24 hours if you are discharged the same day.   Contacts, glasses, hearing aids, dentures or bridgework may not be worn into surgery.    Leave your suitcase in the car.  After surgery it may be brought to your room.  For patients admitted to the hospital, discharge time will be determined by your treatment team.  Patients discharged the day of surgery will not be allowed to drive home.    Special instructions:   [place]- Preparing For Surgery  Before surgery, you can play an important role. Because skin is not sterile, your skin needs to be as free of germs as possible. You can reduce the number of germs on your skin by washing with CHG (chlorahexidine gluconate) Soap before surgery.  CHG is an antiseptic cleaner which kills germs and bonds with the skin to continue killing germs even after washing.    Oral Hygiene is also important to reduce your risk of infection.  Remember - BRUSH YOUR TEETH THE MORNING OF SURGERY WITH YOUR REGULAR TOOTHPASTE  Please do not use if you have an allergy to CHG or antibacterial soaps. If your skin becomes reddened/irritated stop using the CHG.  Do not shave (including legs and underarms) for at least 48 hours prior to first CHG shower. It is OK to shave your face.  Please follow these instructions carefully.   1. Shower the NIGHT BEFORE SURGERY and the MORNING OF SURGERY with CHG Soap.   2. If you chose to wash your hair, wash your hair first as usual with your  normal shampoo.  3. After you shampoo, rinse your hair and body thoroughly to remove the shampoo.  4. Use CHG as you would any other liquid soap. You can apply CHG directly to the skin and wash gently with a scrungie or a clean washcloth.   5. Apply the CHG Soap to your body ONLY FROM THE NECK DOWN.  Do not use on open wounds or open sores. Avoid contact with your eyes, ears, mouth and genitals (private parts). Wash Face and genitals (private parts)  with your normal soap.   6. Wash thoroughly, paying special attention to the area where your surgery will be performed.  7. Thoroughly rinse your body with warm water from the neck down.  8. DO NOT shower/wash with your normal soap after using and rinsing off the CHG Soap.  9. Pat yourself dry with a CLEAN TOWEL.  10. Wear CLEAN PAJAMAS to bed the night before surgery, wear comfortable clothes the morning of surgery  11. Place CLEAN SHEETS on your bed the night of your first shower and DO NOT SLEEP WITH PETS.    Day of Surgery:  Do not apply any deodorants/lotions. Please shower the morning of surgery with the CHG soap  Please wear clean clothes to the hospital/surgery center.   Remember to brush your teeth WITH YOUR REGULAR TOOTHPASTE.   Please read over the following fact sheets that you were given.

## 2019-10-29 NOTE — Telephone Encounter (Signed)
LVM that a portion of her genetic test results are available. Requested that she call back to go over them.

## 2019-10-30 ENCOUNTER — Encounter: Payer: Self-pay | Admitting: Genetic Counselor

## 2019-10-30 ENCOUNTER — Other Ambulatory Visit: Payer: Self-pay

## 2019-10-30 ENCOUNTER — Encounter (HOSPITAL_COMMUNITY): Payer: Self-pay

## 2019-10-30 ENCOUNTER — Encounter (HOSPITAL_COMMUNITY)
Admission: RE | Admit: 2019-10-30 | Discharge: 2019-10-30 | Disposition: A | Discharge status: Home / Self Care | Payer: Medicare Other | Source: Ambulatory Visit | Attending: Surgery | Admitting: Surgery

## 2019-10-30 DIAGNOSIS — R9431 Abnormal electrocardiogram [ECG] [EKG]: Secondary | ICD-10-CM | POA: Insufficient documentation

## 2019-10-30 DIAGNOSIS — Z01818 Encounter for other preprocedural examination: Secondary | ICD-10-CM | POA: Diagnosis not present

## 2019-10-30 HISTORY — DX: Malignant (primary) neoplasm, unspecified: C80.1

## 2019-10-30 LAB — BASIC METABOLIC PANEL
Anion gap: 12 (ref 5–15)
BUN: 16 mg/dL (ref 6–20)
CO2: 25 mmol/L (ref 22–32)
Calcium: 9.8 mg/dL (ref 8.9–10.3)
Chloride: 100 mmol/L (ref 98–111)
Creatinine, Ser: 1.15 mg/dL — ABNORMAL HIGH (ref 0.44–1.00)
GFR calc Af Amer: >60 mL/min (ref >60)
GFR calc non Af Amer: 53 mL/min — ABNORMAL LOW (ref >60)
Glucose, Bld: 130 mg/dL — ABNORMAL HIGH (ref 70–99)
Potassium: 3.6 mmol/L (ref 3.5–5.1)
Sodium: 137 mmol/L (ref 135–145)

## 2019-10-30 LAB — CBC
HCT: 40.2 % (ref 36.0–46.0)
Hemoglobin: 12.2 g/dL (ref 12.0–15.0)
MCH: 25.3 pg — ABNORMAL LOW (ref 26.0–34.0)
MCHC: 30.3 g/dL (ref 30.0–36.0)
MCV: 83.4 fL (ref 80.0–100.0)
Platelets: 609 10*3/uL — ABNORMAL HIGH (ref 150–400)
RBC: 4.82 MIL/uL (ref 3.87–5.11)
RDW: 13.7 % (ref 11.5–15.5)
WBC: 15.7 10*3/uL — ABNORMAL HIGH (ref 4.0–10.5)
nRBC: 0 % (ref 0.0–0.2)

## 2019-10-30 LAB — GLUCOSE, CAPILLARY: Glucose-Capillary: 159 mg/dL — ABNORMAL HIGH (ref 70–99)

## 2019-10-30 NOTE — Progress Notes (Signed)
Patient denies shortness of breath, fever, cough and chest pain.  PCP - Dr Simona Huh Cardiologist - denies Diabetes - Dr Buddy Duty  Chest x-ray - denies EKG - 10/30/19 Stress Test - denies ECHO - 2013 Cardiac Cath - denies  Fasting Blood Sugar - 140-160s Checks Blood Sugar_1_ times a day  Aspirin Instructions: Follow your surgeon's instructions on when to stop aspirin prior to surgery,  If no instructions were given by your surgeon then you will need to call the office for those instructions.  ERAS: Clears til 9:30 am, no drink.  Anesthesia review: No  Seed Implant on 11/03/19  Coronavirus Screening Have you experienced the following symptoms:  Cough yes/no: No Fever (>100.38F)  yes/no: No Runny nose yes/no: No Sore throat yes/no: No Difficulty breathing/shortness of breath  yes/no: No  Have you traveled in the last 14 days and where? yes/no: No  Patient verbalized understanding of instructions that were given to them at the PAT appointment.

## 2019-10-31 ENCOUNTER — Other Ambulatory Visit (HOSPITAL_COMMUNITY)
Admission: RE | Admit: 2019-10-31 | Discharge: 2019-10-31 | Disposition: A | Discharge status: Home / Self Care | Payer: Medicare Other | Source: Ambulatory Visit | Attending: Surgery | Admitting: Surgery

## 2019-10-31 DIAGNOSIS — Z20828 Contact with and (suspected) exposure to other viral communicable diseases: Secondary | ICD-10-CM | POA: Diagnosis not present

## 2019-10-31 DIAGNOSIS — Z01812 Encounter for preprocedural laboratory examination: Secondary | ICD-10-CM | POA: Insufficient documentation

## 2019-11-01 LAB — NOVEL CORONAVIRUS, NAA (HOSP ORDER, SEND-OUT TO REF LAB; TAT 18-24 HRS): SARS-CoV-2, NAA: NOT DETECTED

## 2019-11-03 ENCOUNTER — Telehealth: Payer: Self-pay

## 2019-11-03 MED ORDER — CEFAZOLIN SODIUM-DEXTROSE 2-4 GM/100ML-% IV SOLN
2.0000 g | INTRAVENOUS | Status: AC
  Administered 2019-11-04: 2 g via INTRAVENOUS
  Filled 2019-11-03: qty 100

## 2019-11-03 NOTE — Telephone Encounter (Signed)
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 Planning lumpectomy tomorrow followed by radiation and antiestrogens.   Spoke with patient via phone to introduce self and service at [facility]  Patient reports normal appetite  Medications:  reviewed  Labs: reviewed  Anthropometrics:   Height: 60 inches Weight: 192 lb  BMI: 37   NUTRITION DIAGNOSIS: Food and nutrition related knowledge deficit related to new diagnosis of breast cancer as evidenced by no prior need for nutrition related information.  INTERVENTION:   Discussed briefly packet of information regarding nutritional tips for breast cancer patients.  No questions at this time.  Contact information provided and patient knows to contact me with questions/concerns.    MONITORING, EVALUATION, and GOAL: Pt will consume a healthy plant based diet to maintain lean body mass throughout treatment.   Tata Timmins B. Zenia Resides, [place], [place] Registered Dietitian [phone] (pager)

## 2019-11-04 ENCOUNTER — Encounter (HOSPITAL_COMMUNITY)
Admission: RE | Disposition: A | Discharge status: Home / Self Care | Payer: Self-pay | Source: Home / Self Care | Attending: Surgery

## 2019-11-04 ENCOUNTER — Other Ambulatory Visit: Payer: Self-pay

## 2019-11-04 ENCOUNTER — Ambulatory Visit (HOSPITAL_COMMUNITY): Payer: Medicare Other | Admitting: Certified Registered Nurse Anesthetist

## 2019-11-04 ENCOUNTER — Encounter (HOSPITAL_COMMUNITY): Payer: Self-pay

## 2019-11-04 ENCOUNTER — Ambulatory Visit (HOSPITAL_COMMUNITY)
Admission: RE | Admit: 2019-11-04 | Discharge: 2019-11-04 | Disposition: A | Discharge status: Home / Self Care | Payer: Medicare Other | Attending: Surgery | Admitting: Surgery

## 2019-11-04 ENCOUNTER — Encounter (HOSPITAL_COMMUNITY)
Admission: RE | Admit: 2019-11-04 | Discharge: 2019-11-04 | Disposition: A | Discharge status: Home / Self Care | Payer: Medicare Other | Source: Ambulatory Visit | Attending: Surgery | Admitting: Surgery

## 2019-11-04 DIAGNOSIS — E119 Type 2 diabetes mellitus without complications: Secondary | ICD-10-CM | POA: Diagnosis not present

## 2019-11-04 DIAGNOSIS — Z79899 Other long term (current) drug therapy: Secondary | ICD-10-CM | POA: Diagnosis not present

## 2019-11-04 DIAGNOSIS — Z7982 Long term (current) use of aspirin: Secondary | ICD-10-CM | POA: Insufficient documentation

## 2019-11-04 DIAGNOSIS — F329 Major depressive disorder, single episode, unspecified: Secondary | ICD-10-CM | POA: Insufficient documentation

## 2019-11-04 DIAGNOSIS — F419 Anxiety disorder, unspecified: Secondary | ICD-10-CM | POA: Diagnosis not present

## 2019-11-04 DIAGNOSIS — I10 Essential (primary) hypertension: Secondary | ICD-10-CM | POA: Diagnosis not present

## 2019-11-04 DIAGNOSIS — C50411 Malignant neoplasm of upper-outer quadrant of right female breast: Secondary | ICD-10-CM | POA: Diagnosis not present

## 2019-11-04 DIAGNOSIS — G4730 Sleep apnea, unspecified: Secondary | ICD-10-CM | POA: Diagnosis not present

## 2019-11-04 DIAGNOSIS — Z794 Long term (current) use of insulin: Secondary | ICD-10-CM | POA: Diagnosis not present

## 2019-11-04 DIAGNOSIS — D0591 Unspecified type of carcinoma in situ of right breast: Secondary | ICD-10-CM

## 2019-11-04 HISTORY — PX: BREAST LUMPECTOMY WITH RADIOACTIVE SEED AND SENTINEL LYMPH NODE BIOPSY: SHX6550

## 2019-11-04 LAB — GLUCOSE, CAPILLARY
Glucose-Capillary: 155 mg/dL — ABNORMAL HIGH (ref 70–99)
Glucose-Capillary: 166 mg/dL — ABNORMAL HIGH (ref 70–99)

## 2019-11-04 SURGERY — BREAST LUMPECTOMY WITH RADIOACTIVE SEED AND SENTINEL LYMPH NODE BIOPSY
Anesthesia: General | Site: Breast | Laterality: Right

## 2019-11-04 MED ORDER — FENTANYL CITRATE (PF) 100 MCG/2ML IJ SOLN
INTRAMUSCULAR | Status: AC
  Administered 2019-11-04: 13:00:00 100 ug via INTRAVENOUS
  Filled 2019-11-04: qty 2

## 2019-11-04 MED ORDER — IBUPROFEN 800 MG PO TABS: 800.0000 mg | ORAL_TABLET | Freq: Three times a day (TID) | ORAL | 0 refills | Status: DC | PRN

## 2019-11-04 MED ORDER — FENTANYL CITRATE (PF) 100 MCG/2ML IJ SOLN: 25.0000 ug | INTRAMUSCULAR | Status: DC | PRN

## 2019-11-04 MED ORDER — ACETAMINOPHEN 325 MG PO TABS: 325.0000 mg | ORAL_TABLET | Freq: Once | ORAL | Status: DC | PRN

## 2019-11-04 MED ORDER — ONDANSETRON HCL 4 MG/2ML IJ SOLN
INTRAMUSCULAR | Status: DC | PRN
  Administered 2019-11-04: 4 mg via INTRAVENOUS

## 2019-11-04 MED ORDER — PHENYLEPHRINE 40 MCG/ML (10ML) SYRINGE FOR IV PUSH (FOR BLOOD PRESSURE SUPPORT)
PREFILLED_SYRINGE | INTRAVENOUS | Status: AC
  Filled 2019-11-04: qty 10

## 2019-11-04 MED ORDER — LACTATED RINGERS IV SOLN
INTRAVENOUS | Status: DC
  Administered 2019-11-04: 11:00:00 via INTRAVENOUS

## 2019-11-04 MED ORDER — BUPIVACAINE-EPINEPHRINE 0.5% -1:200000 IJ SOLN
INTRAMUSCULAR | Status: AC
  Filled 2019-11-04: qty 1

## 2019-11-04 MED ORDER — MIDAZOLAM HCL 2 MG/2ML IJ SOLN
2.0000 mg | Freq: Once | INTRAMUSCULAR | Status: AC
  Administered 2019-11-04: 13:00:00 2 mg via INTRAVENOUS

## 2019-11-04 MED ORDER — CELECOXIB 200 MG PO CAPS
ORAL_CAPSULE | ORAL | Status: AC
  Administered 2019-11-04: 200 mg via ORAL
  Filled 2019-11-04: qty 1

## 2019-11-04 MED ORDER — FENTANYL CITRATE (PF) 100 MCG/2ML IJ SOLN
100.0000 ug | Freq: Once | INTRAMUSCULAR | Status: AC
  Administered 2019-11-04: 13:00:00 100 ug via INTRAVENOUS

## 2019-11-04 MED ORDER — PROPOFOL 10 MG/ML IV BOLUS
INTRAVENOUS | Status: DC | PRN
  Administered 2019-11-04: 50 mg via INTRAVENOUS
  Administered 2019-11-04: 150 mg via INTRAVENOUS
  Administered 2019-11-04: 90 mg via INTRAVENOUS

## 2019-11-04 MED ORDER — MEPERIDINE HCL 25 MG/ML IJ SOLN: 6.2500 mg | INTRAMUSCULAR | Status: DC | PRN

## 2019-11-04 MED ORDER — ACETAMINOPHEN 160 MG/5ML PO SOLN: 325.0000 mg | Freq: Once | ORAL | Status: DC | PRN

## 2019-11-04 MED ORDER — MIDAZOLAM HCL 2 MG/2ML IJ SOLN
INTRAMUSCULAR | Status: AC
  Administered 2019-11-04: 2 mg via INTRAVENOUS
  Filled 2019-11-04: qty 2

## 2019-11-04 MED ORDER — CHLORHEXIDINE GLUCONATE CLOTH 2 % EX PADS: 6.0000 | MEDICATED_PAD | Freq: Once | CUTANEOUS | Status: DC

## 2019-11-04 MED ORDER — BUPIVACAINE-EPINEPHRINE (PF) 0.5% -1:200000 IJ SOLN
INTRAMUSCULAR | Status: DC | PRN
  Administered 2019-11-04: 15 mL

## 2019-11-04 MED ORDER — CELECOXIB 200 MG PO CAPS
200.0000 mg | ORAL_CAPSULE | ORAL | Status: AC
  Administered 2019-11-04: 11:00:00 200 mg via ORAL

## 2019-11-04 MED ORDER — LIDOCAINE 2% (20 MG/ML) 5 ML SYRINGE
INTRAMUSCULAR | Status: DC | PRN
  Administered 2019-11-04: 40 mg via INTRAVENOUS

## 2019-11-04 MED ORDER — HYDROCODONE-ACETAMINOPHEN 5-325 MG PO TABS: 1.0000 | ORAL_TABLET | Freq: Four times a day (QID) | ORAL | 0 refills | Status: DC | PRN

## 2019-11-04 MED ORDER — FENTANYL CITRATE (PF) 250 MCG/5ML IJ SOLN
INTRAMUSCULAR | Status: AC
  Filled 2019-11-04: qty 5

## 2019-11-04 MED ORDER — 0.9 % SODIUM CHLORIDE (POUR BTL) OPTIME
TOPICAL | Status: DC | PRN
  Administered 2019-11-04: 1000 mL

## 2019-11-04 MED ORDER — METHYLENE BLUE 0.5 % INJ SOLN
INTRAVENOUS | Status: AC
  Filled 2019-11-04: qty 10

## 2019-11-04 MED ORDER — LACTATED RINGERS IV SOLN: INTRAVENOUS | Status: DC

## 2019-11-04 MED ORDER — FENTANYL CITRATE (PF) 100 MCG/2ML IJ SOLN
INTRAMUSCULAR | Status: DC | PRN
  Administered 2019-11-04: 50 ug via INTRAVENOUS

## 2019-11-04 MED ORDER — DEXAMETHASONE SODIUM PHOSPHATE 10 MG/ML IJ SOLN
INTRAMUSCULAR | Status: DC | PRN
  Administered 2019-11-04: 4 mg via INTRAVENOUS

## 2019-11-04 MED ORDER — SUCCINYLCHOLINE CHLORIDE 200 MG/10ML IV SOSY
PREFILLED_SYRINGE | INTRAVENOUS | Status: AC
  Filled 2019-11-04: qty 10

## 2019-11-04 MED ORDER — ACETAMINOPHEN 500 MG PO TABS
ORAL_TABLET | ORAL | Status: AC
  Administered 2019-11-04: 1000 mg via ORAL
  Filled 2019-11-04: qty 2

## 2019-11-04 MED ORDER — TECHNETIUM TC 99M SULFUR COLLOID FILTERED
1.0000 | Freq: Once | INTRAVENOUS | Status: AC | PRN
  Administered 2019-11-04: 1 via INTRADERMAL

## 2019-11-04 MED ORDER — SUCCINYLCHOLINE CHLORIDE 200 MG/10ML IV SOSY
PREFILLED_SYRINGE | INTRAVENOUS | Status: DC | PRN
  Administered 2019-11-04: 160 mg via INTRAVENOUS

## 2019-11-04 MED ORDER — HEMOSTATIC AGENTS (NO CHARGE) OPTIME
TOPICAL | Status: DC | PRN
  Administered 2019-11-04: 1 via TOPICAL

## 2019-11-04 MED ORDER — PROPOFOL 10 MG/ML IV BOLUS
INTRAVENOUS | Status: AC
  Filled 2019-11-04: qty 20

## 2019-11-04 MED ORDER — PROMETHAZINE HCL 25 MG/ML IJ SOLN: 6.2500 mg | INTRAMUSCULAR | Status: DC | PRN

## 2019-11-04 MED ORDER — ROCURONIUM BROMIDE 10 MG/ML (PF) SYRINGE
PREFILLED_SYRINGE | INTRAVENOUS | Status: DC | PRN
  Administered 2019-11-04: 60 mg via INTRAVENOUS

## 2019-11-04 MED ORDER — MIDAZOLAM HCL 2 MG/2ML IJ SOLN
INTRAMUSCULAR | Status: AC
  Filled 2019-11-04: qty 2

## 2019-11-04 MED ORDER — ONDANSETRON HCL 4 MG/2ML IJ SOLN
INTRAMUSCULAR | Status: AC
  Filled 2019-11-04: qty 2

## 2019-11-04 MED ORDER — ACETAMINOPHEN 10 MG/ML IV SOLN: 1000.0000 mg | Freq: Once | INTRAVENOUS | Status: DC | PRN

## 2019-11-04 MED ORDER — ACETAMINOPHEN 500 MG PO TABS
1000.0000 mg | ORAL_TABLET | ORAL | Status: AC
  Administered 2019-11-04: 11:00:00 1000 mg via ORAL

## 2019-11-04 MED ORDER — BUPIVACAINE-EPINEPHRINE (PF) 0.5% -1:200000 IJ SOLN
INTRAMUSCULAR | Status: DC | PRN
  Administered 2019-11-04: 30 mL

## 2019-11-04 MED ORDER — PHENYLEPHRINE 40 MCG/ML (10ML) SYRINGE FOR IV PUSH (FOR BLOOD PRESSURE SUPPORT)
PREFILLED_SYRINGE | INTRAVENOUS | Status: DC | PRN
  Administered 2019-11-04 (×2): 80 ug via INTRAVENOUS
  Administered 2019-11-04 (×2): 120 ug via INTRAVENOUS

## 2019-11-04 SURGICAL SUPPLY — 40 items
APPLIER CLIP 9.375 MED OPEN (MISCELLANEOUS) ×2
BINDER BREAST 3XL (GAUZE/BANDAGES/DRESSINGS) ×2 IMPLANT
BINDER BREAST LRG (GAUZE/BANDAGES/DRESSINGS) IMPLANT
BINDER BREAST XLRG (GAUZE/BANDAGES/DRESSINGS) IMPLANT
CANISTER SUCT 3000ML PPV (MISCELLANEOUS) ×2 IMPLANT
CHLORAPREP W/TINT 26 (MISCELLANEOUS) ×2 IMPLANT
CLIP APPLIE 9.375 MED OPEN (MISCELLANEOUS) ×1 IMPLANT
CONT SPEC 4OZ CLIKSEAL STRL BL (MISCELLANEOUS) ×2 IMPLANT
COVER PROBE W GEL 5X96 (DRAPES) ×2 IMPLANT
COVER SURGICAL LIGHT HANDLE (MISCELLANEOUS) ×2 IMPLANT
COVER WAND RF STERILE (DRAPES) IMPLANT
DERMABOND ADVANCED (GAUZE/BANDAGES/DRESSINGS) ×1
DERMABOND ADVANCED .7 DNX12 (GAUZE/BANDAGES/DRESSINGS) ×1 IMPLANT
DEVICE DUBIN SPECIMEN MAMMOGRA (MISCELLANEOUS) ×2 IMPLANT
DRAPE CHEST BREAST 15X10 FENES (DRAPES) ×2 IMPLANT
ELECT CAUTERY BLADE 6.4 (BLADE) ×2 IMPLANT
ELECT REM PT RETURN 9FT ADLT (ELECTROSURGICAL) ×2
ELECTRODE REM PT RTRN 9FT ADLT (ELECTROSURGICAL) ×1 IMPLANT
GLOVE BIO SURGEON STRL SZ8 (GLOVE) ×2 IMPLANT
GLOVE BIOGEL PI IND STRL 8 (GLOVE) ×1 IMPLANT
GLOVE BIOGEL PI INDICATOR 8 (GLOVE) ×1
GOWN STRL REUS W/ TWL LRG LVL3 (GOWN DISPOSABLE) ×1 IMPLANT
GOWN STRL REUS W/ TWL XL LVL3 (GOWN DISPOSABLE) ×1 IMPLANT
GOWN STRL REUS W/TWL LRG LVL3 (GOWN DISPOSABLE) ×1
GOWN STRL REUS W/TWL XL LVL3 (GOWN DISPOSABLE) ×1
KIT BASIN OR (CUSTOM PROCEDURE TRAY) ×2 IMPLANT
KIT MARKER MARGIN INK (KITS) ×2 IMPLANT
LIGHT WAVEGUIDE WIDE FLAT (MISCELLANEOUS) IMPLANT
NEEDLE 18GX1X1/2 (RX/OR ONLY) (NEEDLE) IMPLANT
NEEDLE FILTER BLUNT 18X 1/2SAF (NEEDLE)
NEEDLE FILTER BLUNT 18X1 1/2 (NEEDLE) IMPLANT
NEEDLE HYPO 25GX1X1/2 BEV (NEEDLE) ×2 IMPLANT
NS IRRIG 1000ML POUR BTL (IV SOLUTION) ×2 IMPLANT
PACK GENERAL/GYN (CUSTOM PROCEDURE TRAY) ×2 IMPLANT
PAD ABD 8X10 STRL (GAUZE/BANDAGES/DRESSINGS) ×2 IMPLANT
SUT MNCRL AB 4-0 PS2 18 (SUTURE) ×2 IMPLANT
SUT VIC AB 3-0 SH 18 (SUTURE) ×2 IMPLANT
SYR CONTROL 10ML LL (SYRINGE) ×2 IMPLANT
TOWEL GREEN STERILE (TOWEL DISPOSABLE) ×2 IMPLANT
TOWEL GREEN STERILE FF (TOWEL DISPOSABLE) ×2 IMPLANT

## 2019-11-04 NOTE — Interval H&P Note (Signed)
History and Physical Interval Note:  11/04/2019 12:52 PM  Christina Webster  has presented today for surgery, with the diagnosis of BREAST CANCER.  The various methods of treatment have been discussed with the patient and family. After consideration of risks, benefits and other options for treatment, the patient has consented to  Procedure(s): RIGHT BREAST LUMPECTOMY WITH RADIOACTIVE SEED AND SENTINEL LYMPH NODE MAPPING (Right) as a surgical intervention.  The patient's history has been reviewed, patient examined, no change in status, stable for surgery.  I have reviewed the patient's chart and labs.  Questions were answered to the patient's satisfaction.     [place]

## 2019-11-04 NOTE — Anesthesia Preprocedure Evaluation (Addendum)
Anesthesia Evaluation  Patient identified by MRN, date of birth, ID band Patient awake    Reviewed: Allergy & Precautions, NPO status , Patient's Chart, lab work & pertinent test results  Airway Mallampati: III  TM Distance: >3 FB Neck ROM: Full    Dental  (+) Poor Dentition, Loose, Partial Upper, Partial Lower,    Pulmonary sleep apnea ,    breath sounds clear to auscultation       Cardiovascular hypertension, Pt. on medications  Rhythm:Regular Rate:Normal     Neuro/Psych  Headaches, PSYCHIATRIC DISORDERS Anxiety Depression    GI/Hepatic   Endo/Other  diabetes, Type 2, Insulin Dependent  Renal/GU      Musculoskeletal  (+) Fibromyalgia -  Abdominal (+) + obese,   Peds  Hematology negative hematology ROS (+)   Anesthesia Other Findings   Reproductive/Obstetrics                            Lab Results  Component Value Date   WBC 15.7 (H) 10/30/2019   HGB 12.2 10/30/2019   HCT 40.2 10/30/2019   MCV 83.4 10/30/2019   PLT 609 (H) 10/30/2019   Lab Results  Component Value Date   CREATININE 1.15 (H) 10/30/2019   BUN 16 10/30/2019   NA 137 10/30/2019   K 3.6 10/30/2019   CL 100 10/30/2019   CO2 25 10/30/2019   No results found for: INR, PROTIME  EKG: normal sinus rhythm.  Anesthesia Physical Anesthesia Plan  ASA: II  Anesthesia Plan: General   Post-op Pain Management: GA combined w/ Regional for post-op pain   Induction: Intravenous  PONV Risk Score and Plan: 4 or greater and Ondansetron, Dexamethasone, Midazolam and Scopolamine patch - Pre-op  Airway Management Planned: LMA  Additional Equipment: None  Intra-op Plan:   Post-operative Plan: Extubation in OR  Informed Consent: I have reviewed the patients History and Physical, chart, labs and discussed the procedure including the risks, benefits and alternatives for the proposed anesthesia with the patient or authorized  representative who has indicated his/her understanding and acceptance.     Dental advisory given  Plan Discussed with: CRNA  Anesthesia Plan Comments:         Anesthesia Quick Evaluation

## 2019-11-04 NOTE — Anesthesia Postprocedure Evaluation (Signed)
Anesthesia Post Note  Patient: Christina Webster  Procedure(s) Performed: RIGHT BREAST LUMPECTOMY WITH RADIOACTIVE SEED AND SENTINEL LYMPH NODE MAPPING (Right Breast)     Patient location during evaluation: PACU Anesthesia Type: General Level of consciousness: awake and alert Pain management: pain level controlled Vital Signs Assessment: post-procedure vital signs reviewed and stable Respiratory status: spontaneous breathing, nonlabored ventilation, respiratory function stable and patient connected to nasal cannula oxygen Cardiovascular status: blood pressure returned to baseline and stable Postop Assessment: no apparent nausea or vomiting Anesthetic complications: no    Last Vitals:  Vitals:   11/04/19 1439 11/04/19 1454  BP: 131/78 135/80  Pulse: (!) 102 (!) 101  Resp: (!) 22 17  Temp:    SpO2: 96% 94%    Last Pain:  Vitals:   11/04/19 1454  TempSrc:   PainSc: 3                  Effie Berkshire

## 2019-11-04 NOTE — Op Note (Signed)
Preoperative diagnosis: Stage I right breast cancer upper outer quadrant  Postop diagnosis: Same  Procedure: Right breast seed lumpectomy with right axillary sentinel lymph node mapping of deep right axillary sentinel nodes  Surgeon: [name], MD  Anesthesia: General with pectoral block and 0.25% Sensorcaine local  EBL: 20 cc  Specimen: Right breast tissue with seed and clip verified by Faxitron and 3 right axillary sentinel nodes hot  Drains: None  IV fluids: Per anesthesia record  Indications for procedure: Patient presents for right breast lumpectomy after being diagnosed with stage I right breast cancer in her right upper outer quadrant of her breast.  She for breast conserving surgery after being seen in the multidisciplinary clinic.The procedure has been discussed with the patient. Alternatives to surgery have been discussed with the patient.  Risks of surgery include bleeding,  Infection,  Seroma formation, death,  and the need for further surgery.   The patient understands and wishes to proceed.Sentinel lymph node mapping and dissection has been discussed with the patient.  Risk of bleeding,  Infection,  Seroma formation,  Additional procedures,,  Shoulder weakness ,  Shoulder stiffness,  Nerve and blood vessel injury and reaction to the mapping dyes have been discussed.  Alternatives to surgery have been discussed with the patient.  The patient agrees to proceed.   Description of procedure: The patient was met in the holding area.  Questions were answered and the right breast was marked as the correct side.  The neoprobe was used to verify the seed location in the right breast upper outer quadrant.  She underwent pectoral block and injection of technetium sulfur colloid per radiology protocol.  She was taken back to the operative room.  She is placed upon upon the OR table.  After induction of general esthesia the right breast was prepped and draped in sterile fashion timeout was  performed.  Neoprobe was used to identify the seed right breast upper inner quadrant and curvilinear incision was made over this.  Dissection was carried down to the mass and all tissue around the seed and clip were excised with a grossly negative margin.  Of note the marker had migrated about a centimeter and a centimeter half away from the seed with this was also included in the specimen radiograph.  Hemostasis achieved.  Wound closed with 3-0 Vicryl and 4 Monocryl.  The right axilla was then evaluated.  The neoprobe was switched technetium sulfur colloid.  Incision was then made in the right axilla.  Dissection was carried down into the level 1 axillary lymph node basin.  There were 3 hot nodes removed with background counts approaching 0.  Hemostasis achieved.  Wound closed with 3-0 Vicryl and 4-0 Monocryl.  Dermabond applied to both incisions and a breast binder placed.  All counts were found to be correct.  She was awoke extubated taken to recovery in satisfactory condition.

## 2019-11-04 NOTE — Anesthesia Procedure Notes (Signed)
Anesthesia [facility] Block: Pectoralis block   Pre-Anesthetic Checklist: ,, timeout performed, Correct Patient, Correct Site, Correct Laterality, Correct Procedure, Correct Position, site marked, Risks and benefits discussed,  Surgical consent,  Pre-op evaluation,  At surgeon's request and post-op pain management  Laterality: Right  Prep: chloraprep       Needles:  Injection technique: Single-shot  Needle Type: Echogenic Stimulator Needle     Needle Length: 9cm  Needle Gauge: 21     Additional Needles:   Procedures:,,,, ultrasound used (permanent image in chart),,,,  Narrative:  Start time: 11/04/2019 12:20 PM End time: 11/04/2019 12:30 PM Injection made incrementally with aspirations every 5 mL.  Performed by: Personally  Anesthesiologist: Effie Berkshire, MD  Additional Notes: Patient tolerated the procedure well. Local anesthetic introduced in an incremental fashion under minimal resistance after negative aspirations. No paresthesias were elicited. After completion of the procedure, no acute issues were identified and patient continued to be monitored by RN.

## 2019-11-04 NOTE — Anesthesia Procedure Notes (Signed)
Procedure Name: Intubation Date/Time: 11/04/2019 1:12 PM Performed by: Colin Benton, CRNA Pre-anesthesia Checklist: Patient identified, Emergency Drugs available, Suction available and Patient being monitored Patient Re-evaluated:Patient Re-evaluated prior to induction Oxygen Delivery Method: Circle system utilized Preoxygenation: Pre-oxygenation with 100% oxygen Induction Type: IV induction Ventilation: Mask ventilation without difficulty and Oral airway inserted - appropriate to patient size Laryngoscope Size: Mac and 3 Grade View: Grade II Tube type: Oral Tube size: 7.0 mm Number of attempts: 1 Airway Equipment and Method: Stylet Placement Confirmation: ETT inserted through vocal cords under direct vision,  positive ETCO2 and breath sounds checked- equal and bilateral Secured at: 21 cm Tube secured with: Tape Dental Injury: Teeth and Oropharynx as per pre-operative assessment

## 2019-11-04 NOTE — Discharge Instructions (Signed)
[facility] °Office Phone Number [phone] ° °BREAST BIOPSY/ PARTIAL MASTECTOMY: POST OP INSTRUCTIONS ° °Always review your discharge instruction sheet given to you by the facility where your surgery was performed. ° °IF YOU HAVE DISABILITY OR FAMILY LEAVE FORMS, YOU MUST BRING THEM TO THE OFFICE FOR PROCESSING.  DO NOT GIVE THEM TO YOUR DOCTOR. ° °1. A prescription for pain medication may be given to you upon discharge.  Take your pain medication as prescribed, if needed.  If narcotic pain medicine is not needed, then you may take acetaminophen (Tylenol) or ibuprofen (Advil) as needed. °2. Take your usually prescribed medications unless otherwise directed °3. If you need a refill on your pain medication, please contact your pharmacy.  They will contact our office to request authorization.  Prescriptions will not be filled after 5pm or on week-ends. °4. You should eat very light the first 24 hours after surgery, such as soup, crackers, pudding, etc.  Resume your normal diet the day after surgery. °5. Most patients will experience some swelling and bruising in the breast.  Ice packs and a good support bra will help.  Swelling and bruising can take several days to resolve.  °6. It is common to experience some constipation if taking pain medication after surgery.  Increasing fluid intake and taking a stool softener will usually help or prevent this problem from occurring.  A mild laxative (Milk of Magnesia or Miralax) should be taken according to package directions if there are no bowel movements after 48 hours. °7. Unless discharge instructions indicate otherwise, you may remove your bandages 24-48 hours after surgery, and you may shower at that time.  You may have steri-strips (small skin tapes) in place directly over the incision.  These strips should be left on the skin for 7-10 days.  If your surgeon used skin glue on the incision, you may shower in 24 hours.  The glue will flake off over the  next 2-3 weeks.  Any sutures or staples will be removed at the office during your follow-up visit. °8. ACTIVITIES:  You may resume regular daily activities (gradually increasing) beginning the next day.  Wearing a good support bra or sports bra minimizes pain and swelling.  You may have sexual intercourse when it is comfortable. °a. You may drive when you no longer are taking prescription pain medication, you can comfortably wear a seatbelt, and you can safely maneuver your car and apply brakes. °b. RETURN TO WORK:  ______________________________________________________________________________________ °9. You should see your doctor in the office for a follow-up appointment approximately two weeks after your surgery.  Your doctor’s nurse will typically make your follow-up appointment when she calls you with your pathology report.  Expect your pathology report 2-3 business days after your surgery.  You may call to check if you do not hear from us after three days. °10. OTHER INSTRUCTIONS: _______________________________________________________________________________________________ _____________________________________________________________________________________________________________________________________ °_____________________________________________________________________________________________________________________________________ °_____________________________________________________________________________________________________________________________________ ° °WHEN TO CALL YOUR DOCTOR: °1. Fever over 101.0 °2. Nausea and/or vomiting. °3. Extreme swelling or bruising. °4. Continued bleeding from incision. °5. Increased pain, redness, or drainage from the incision. ° °The clinic staff is available to answer your questions during regular business hours.  Please don’t hesitate to call and ask to speak to one of the nurses for clinical concerns.  If you have a medical emergency, go to the nearest  emergency room or call 911.  A surgeon from [facility] is always on call at the hospital. ° °For further questions, please visit [email or web address]  °

## 2019-11-04 NOTE — Transfer of Care (Signed)
Immediate Anesthesia Transfer of Care Note  Patient: Christina Webster  Procedure(s) Performed: RIGHT BREAST LUMPECTOMY WITH RADIOACTIVE SEED AND SENTINEL LYMPH NODE MAPPING (Right Breast)  Patient Location: PACU  Anesthesia Type:General  Level of Consciousness: drowsy and patient cooperative  Airway & Oxygen Therapy: Patient Spontanous Breathing and Patient connected to face mask oxygen  Post-op Assessment: Report given to RN and Post -op Vital signs reviewed and stable  Post vital signs: Reviewed and stable  Last Vitals:  Vitals Value Taken Time  BP 141/90 11/04/19 1424  Temp    Pulse 98 11/04/19 1425  Resp 9 11/04/19 1425  SpO2 100 % 11/04/19 1425  Vitals shown include unvalidated device data.  Last Pain:  Vitals:   11/04/19 1104  TempSrc:   PainSc: 0-No pain         Complications: Tooth loss.

## 2019-11-05 ENCOUNTER — Encounter (HOSPITAL_COMMUNITY): Payer: Self-pay | Admitting: Surgery

## 2019-11-07 LAB — SURGICAL PATHOLOGY

## 2019-11-09 ENCOUNTER — Telehealth: Payer: Self-pay | Admitting: Genetic Counselor

## 2019-11-09 ENCOUNTER — Encounter: Payer: Self-pay | Admitting: Radiation Oncology

## 2019-11-09 ENCOUNTER — Encounter: Payer: Self-pay | Admitting: *Deleted

## 2019-11-09 NOTE — Telephone Encounter (Signed)
LVM that the second part of her genetic test results are available and requested that she call back to discuss them.

## 2019-11-10 ENCOUNTER — Ambulatory Visit: Payer: Self-pay | Admitting: Genetic Counselor

## 2019-11-10 DIAGNOSIS — Z1379 Encounter for other screening for genetic and chromosomal anomalies: Secondary | ICD-10-CM

## 2019-11-10 NOTE — Progress Notes (Signed)
HPI:  Ms. Christina Webster was previously seen in the [place] clinic due to a personal and family history of breast cancer and concerns regarding a hereditary predisposition to cancer. Please refer to our prior cancer genetics clinic note for more information regarding our discussion, assessment and recommendations, at the time. Ms. Christina Webster recent genetic test results were disclosed to her, as were recommendations warranted by these results. These results and recommendations are discussed in more detail below.  CANCER HISTORY:  Oncology History  Ductal carcinoma in situ (DCIS) of right breast  10/19/2019 Initial Diagnosis   Ductal carcinoma in situ (DCIS) of right breast   10/27/2019 Genetic Testing   Negative genetic testing:  No pathogenic variants detected on the [facility] Breast Cancer STAT panel. The report date is 10/27/2019. No pathogenic variants detected on the [facility] Common Hereditary Cancers panel. Two variants of uncertain significance were detected: one in the MSH3 gene called c.1502G>A, and one in the POLD1 gene called c.3290G>A. The report date is 11/09/2019.  The Breast Cancer STAT panel offered by [facility] includes sequencing and rearrangement analysis for the following 9 genes:  ATM, BRCA1, BRCA2, CDH1, CHEK2, PALB2, PTEN, STK11 and TP53.  The Common Hereditary Cancers panel offered by [facility] includes sequencing and/or deletion duplication testing of the following 48 genes: APC, ATM, AXIN2, BARD1, BMPR1A, BRCA1, BRCA2, BRIP1, CDH1, CDK4, CDKN2A (p14ARF), CDKN2A (p16INK4a), CHEK2, CTNNA1, DICER1, EPCAM (Deletion/duplication testing only), GREM1 (promoter region deletion/duplication testing only), KIT, MEN1, MLH1, MSH2, MSH3, MSH6, MUTYH, NBN, NF1, NHTL1, PALB2, PDGFRA, PMS2, POLD1, POLE, PTEN, RAD50, RAD51C, RAD51D, RNF43, SDHB, SDHC, SDHD, SMAD4, SMARCA4. STK11, TP53, TSC1, TSC2, and VHL.  The following genes were evaluated for sequence changes only: SDHA and HOXB13 c.251G>A  variant only.      FAMILY HISTORY:  We obtained a detailed, 4-generation family history.  Significant diagnoses are listed below: Family History  Problem Relation Age of Onset   Hypertension Other    Diabetes Other    Cancer Other    Hyperlipidemia Other    Sleep apnea Other    Breast cancer Mother 70   Diabetes Mother    Hypertension Mother    Other Father        Unsure of medical history.   Breast cancer Maternal Aunt 30   Breast cancer Cousin 61       maternal cousin   Healthy Half-Sister    Healthy Half-Brother    Healthy Half-Sister    Stomach cancer Maternal Aunt 15   Ms. Christina Webster has three daughters. She also has a maternal half-brother and two maternal half-sisters. None of these individuals have had cancer.  Ms. Christina Webster mother is currently living at 26, and has a history of breast cancer diagnosed at age 20. She has four maternal aunts. One aunt had breast cancer diagnosed when she was 24, and one aunt had stomach cancer diagnosed at age 6. The aunt with breast cancer also had a daughter who was diagnosed with breast cancer at age 82. There are no other known diagnoses of cancer on the maternal side of the family.  Ms. Christina Webster does not have any information about her father or his side of the family.  Ms. Christina Webster is unaware of previous family history of genetic testing for hereditary cancer risks.   GENETIC TEST RESULTS: Genetic testing reported out on 11/09/2019 through the [facility] Common Hereditary Cancers panel which detected no pathogenic variants.   The Common Hereditary Cancers Panel offered by [facility] includes sequencing and/or deletion  duplication testing of the following 48 genes: APC, ATM, AXIN2, BARD1, BMPR1A, BRCA1, BRCA2, BRIP1, CDH1, CDK4, CDKN2A (p14ARF), CDKN2A (p16INK4a), CHEK2, CTNNA1, DICER1, EPCAM (Deletion/duplication testing only), GREM1 (promoter region deletion/duplication testing only), KIT, MEN1, MLH1, MSH2, MSH3, MSH6, MUTYH, NBN,  NF1, NHTL1, PALB2, PDGFRA, PMS2, POLD1, POLE, PTEN, RAD50, RAD51C, RAD51D, RNF43, SDHB, SDHC, SDHD, SMAD4, SMARCA4. STK11, TP53, TSC1, TSC2, and VHL.  The following genes were evaluated for sequence changes only: SDHA and HOXB13 c.251G>A variant only. The test report will be scanned into EPIC and located under the Molecular Pathology section of the Results Review tab.  A portion of the result report is included below for reference.     We discussed with Ms. Christina Webster that because current genetic testing is not perfect, it is possible there may be a gene mutation in one of these genes that current testing cannot detect, but that chance is small.  We also discussed, that there could be another gene that has not yet been discovered, or that we have not yet tested, that is responsible for the cancer diagnoses in the family. It is also possible there is a hereditary cause for the cancer in the family that Ms. Jones did not inherit and therefore was not identified in her testing.  Therefore, it is important to remain in touch with cancer genetics in the future so that we can continue to offer Ms. Christina Webster the most up to date genetic testing.   Genetic testing did identify two Variants of uncertain significance (VUS) - one in the MSH3 gene called c.1502G>A, and a second in the POLD1 gene called c.3290G>A.  At this time, it is unknown if these variants are associated with increased cancer risk or if they are normal findings, but most variants such as these get reclassified to being inconsequential. They should not be used to make medical management decisions. With time, we suspect the lab will determine the significance of these variants, if any. If we do learn more about them, we will try to contact Ms. Jones to discuss it further. However, it is important to stay in touch with [place] periodically and keep the address and phone number up to date.  CANCER SCREENING RECOMMENDATIONS: Ms. Christina Webster test result is considered  negative (normal).  This means that we have not identified a hereditary cause for her personal and family history of cancer at this time. Most cancers happen by chance and this negative test suggests that her personal and family of cancer may fall into this category.    While reassuring, this does not definitively rule out a hereditary predisposition to cancer. It is still possible that there could be genetic mutations that are undetectable by current technology. There could be genetic mutations in genes that have not been tested or identified to increase cancer risk.  Therefore, it is recommended she continue to follow the cancer management and screening guidelines provided by heroncology and primary healthcare provider.   An individual's cancer risk and medical management are not determined by genetic test results alone. Overall cancer risk assessment incorporates additional factors, including personal medical history, family history, and any available genetic information that may result in a personalized plan for cancer prevention and surveillance.  RECOMMENDATIONS FOR FAMILY MEMBERS:  Individuals in this family might be at some increased risk of developing cancer, over the general population risk, simply due to the family history of cancer.  We recommended women in this family have a yearly mammogram beginning at age 67, or 10 years  younger than the earliest onset of cancer, an annual clinical breast exam, and perform monthly breast self-exams. Women in this family should also have a gynecological exam as recommended by their primary provider. All family members should have a colonoscopy by age 7.  FOLLOW-UP: Lastly, we discussed with Ms. Christina Webster that cancer genetics is a rapidly advancing field and it is possible that new genetic tests will be appropriate for her and/or her family members in the future. We encouraged her to remain in contact with cancer genetics on an annual basis so we can update her  personal and family histories and let her know of advances in cancer genetics that may benefit this family.   Our contact number was provided. Ms. Christina Webster questions were answered to her satisfaction, and she knows she is welcome to call us at anytime with additional questions or concerns.   Clint Guy, MS, [facility] Certified Genetic Counselor [email or web address] Phone: [phone]

## 2019-11-10 NOTE — Telephone Encounter (Signed)
Revealed that the second part of her genetic test result was also negative.  We have not identified a hereditary cause for the cancer in the family, or an increased risk for other types of cancer due to the genes that were tested.  Again, it will be important for her to keep in contact with genetics to keep up with whether additional testing in the future may be appropriate based on her personal and family history of cancer.  Testing did detect two variants of uncertain significance:  One in the MSH3 gene called c.1502G>A, and one in the POLD1 gene called c.3290G>A.  Her results are still considered normal and these uncertain variants should not impact her medical management.

## 2019-11-23 ENCOUNTER — Encounter: Payer: Self-pay | Admitting: Radiation Oncology

## 2019-11-25 ENCOUNTER — Ambulatory Visit
Admission: RE | Admit: 2019-11-25 | Discharge: 2019-11-25 | Disposition: A | Discharge status: Home / Self Care | Payer: Medicare Other | Source: Ambulatory Visit | Attending: Radiation Oncology | Admitting: Radiation Oncology

## 2019-11-25 ENCOUNTER — Other Ambulatory Visit: Payer: Self-pay

## 2019-11-25 ENCOUNTER — Institutional Professional Consult (permissible substitution): Payer: Medicare Other | Admitting: Radiation Oncology

## 2019-11-25 VITALS — Ht 60.0 in | Wt 199.0 lb

## 2019-11-25 DIAGNOSIS — Z510 Encounter for antineoplastic radiation therapy: Secondary | ICD-10-CM | POA: Diagnosis not present

## 2019-11-25 DIAGNOSIS — D0511 Intraductal carcinoma in situ of right breast: Secondary | ICD-10-CM | POA: Insufficient documentation

## 2019-11-25 DIAGNOSIS — C50411 Malignant neoplasm of upper-outer quadrant of right female breast: Secondary | ICD-10-CM

## 2019-11-25 NOTE — Progress Notes (Signed)
Radiation Oncology         [phone] ________________________________  Name: SONNY VIZZINI        MRN: [id number]  Date of Service: 11/25/2019 DOB: 03/24/1961  CC:Simona Huh, NP  Magrinat, Virgie Dad, MD     REFERRING PHYSICIAN: Magrinat, Virgie Dad, MD   DIAGNOSIS: The encounter diagnosis was Malignant neoplasm of upper-outer quadrant of right female breast, unspecified estrogen receptor status ([place]).   HISTORY OF PRESENT ILLNESS: BRENDEN GAVIA is a 58 y.o. female who was originally seen in the multidisciplinary breast clinic for a new diagnosis of right breast cancer. She was found to have screening detected calcifications in the right breast. Diagnostic imaging revealed a group of calcifications measuring 1.4 cm in the upper outer quadrant. She also had 3 abnormal appearing lymph nodes. A biopsy on 10/14/2019 revealed at least intermediate grade DCIS with a focus of either HG DCIS versus focal invasive disease. Her tumor was ER positive, PR negative. She is going for a biopsy of her her axilla on 10/26/2019. She underwent right axillary node biopsy on 10/26/2019 that was negative for disease. She proceeded with  lumpectomy with sentinel node biopsy on 11/04/2019 and this revealed an intermediate grade DCIS and no invasive component was seen. Her tumor was 1.1 cm and her margins were negative. Her 3 sampled nodes were negative, and her tumor was 1 mm from the medial margin and her tumor was ER positive, PR negative. She is seen via MyChart to discuss adjuvant treatment recommendations.    PREVIOUS RADIATION THERAPY: No   PAST MEDICAL HISTORY:  Past Medical History:  Diagnosis Date  . Anemia   . Anxiety and depression   . Cancer [facility])    right breast can - recent dx  . Chronic back pain   . Diabetic peripheral neuropathy ([place])   . Family history of breast cancer   . Family history of stomach cancer   . Fibromyalgia   . Hypercholesteremia   . Hypertension   . Migraines    occasional   . Type II diabetes mellitus ([place])        PAST SURGICAL HISTORY: Past Surgical History:  Procedure Laterality Date  . BREAST LUMPECTOMY WITH RADIOACTIVE SEED AND SENTINEL LYMPH NODE BIOPSY Right 11/04/2019   Procedure: RIGHT BREAST LUMPECTOMY WITH RADIOACTIVE SEED AND SENTINEL LYMPH NODE MAPPING;  Surgeon: [name], MD;  Location: [place];  Service: General;  Laterality: Right;  . COLONOSCOPY    . TUBAL LIGATION  1988     FAMILY HISTORY:  Family History  Problem Relation Age of Onset  . Hypertension Other   . Diabetes Other   . Cancer Other   . Hyperlipidemia Other   . Sleep apnea Other   . Breast cancer Mother 22  . Diabetes Mother   . Hypertension Mother   . Other Father        Unsure of medical history.  . Breast cancer Maternal Aunt 60  . Breast cancer Cousin 59       maternal cousin  . Healthy Half-Sister   . Healthy Half-Brother   . Healthy Half-Sister   . Stomach cancer Maternal Aunt 64     SOCIAL HISTORY:  reports that she has never smoked. She has never used smokeless tobacco. She reports current alcohol use. She reports that she does not use drugs. The patient is single and lives in [place].   ALLERGIES: Patient has no known allergies.   MEDICATIONS:  Current Outpatient Medications  Medication Sig Dispense Refill  . aspirin EC 81 MG tablet Take 81 mg by mouth daily.    Marland Kitchen atorvastatin (LIPITOR) 10 MG tablet Take 10 mg by mouth at bedtime.    . calcium carbonate (OSCAL) 1500 (600 Ca) MG TABS tablet Take 1 tablet by mouth daily.     . cholecalciferol (VITAMIN D) 25 MCG (1000 UT) tablet Take 1,000 Units by mouth daily.    . Cyanocobalamin (VITAMIN B 12 PO) Take 2,500 mg by mouth daily.     . cyclobenzaprine (FLEXERIL) 10 MG tablet Take 10 mg by mouth at bedtime as needed for muscle spasms.     . diclofenac sodium (VOLTAREN) 1 % GEL Apply 2 g topically daily as needed (Body).    . DULoxetine (CYMBALTA) 30 MG capsule Take 30 mg by mouth  daily.     . fenofibrate (TRICOR) 48 MG tablet Take 48 mg by mouth at bedtime.     . Insulin Lispro Prot & Lispro (HUMALOG MIX 75/25 KWIKPEN) (75-25) 100 UNIT/ML Kwikpen Inject 35-50 Units into the skin See admin instructions. 50 units in the morning and 35 units in the eveing    . losartan-hydrochlorothiazide (HYZAAR) 50-12.5 MG tablet Take 1 tablet by mouth daily.   3  . Magnesium 200 MG TABS Take 400 mg by mouth at bedtime.     . SUMAtriptan (IMITREX) 25 MG tablet Take 25 mg by mouth every 2 (two) hours as needed for migraine. May repeat in 2 hours if headache persists or recurs.    Marland Kitchen SYNJARDY XR 12.04-999 MG TB24 Take 2 tablets by mouth daily.     Marland Kitchen topiramate (TOPAMAX) 25 MG tablet Take 25 mg by mouth at bedtime.    . traMADol (ULTRAM) 50 MG tablet Take 50 mg by mouth 5 (five) times daily as needed for moderate pain.     Marland Kitchen VASCEPA 0.5 g CAPS SMARTSIG:2 Capsule(s) By Mouth As Needed    . ibuprofen (ADVIL) 800 MG tablet Take 1 tablet (800 mg total) by mouth every 8 (eight) hours as needed. (Patient not taking: Reported on 11/25/2019) 30 tablet 0   No current facility-administered medications for this encounter.      REVIEW OF SYSTEMS: On review of systems, the patient reports that she is doing well overall. She is concerned about her axillary incision site as far as healing. She denies any chest pain, shortness of breath, cough, fevers, chills, night sweats, unintended weight changes. She denies any bowel or bladder disturbances, and denies abdominal pain, nausea or vomiting. She denies any new musculoskeletal or joint aches or pains. A complete review of systems is obtained and is otherwise negative.     PHYSICAL EXAM:  Wt Readings from Last 3 Encounters:  11/25/19 199 lb (90.3 kg)  11/04/19 192 lb 3.9 oz (87.2 kg)  10/30/19 192 lb 3 oz (87.2 kg)   Temp Readings from Last 3 Encounters:  11/04/19 98.2 F (36.8 C)  10/30/19 98.9 F (37.2 C) (Oral)  10/21/19 98.5 F (36.9 C)  (Temporal)   BP Readings from Last 3 Encounters:  11/04/19 135/80  10/30/19 131/82  10/21/19 (!) 164/71   Pulse Readings from Last 3 Encounters:  11/04/19 (!) 101  10/30/19 (!) 102  10/21/19 (!) 101   In general this is a well appearing African American female in no acute distress. She's alert and oriented x4 and appropriate throughout the examination. Cardiopulmonary assessment is negative for acute distress and she exhibits normal effort. The right breast  has a well healed axillary incision with mild peeling of the medial aspect of the lesion without drainage or granulation. The breast incision is healed without disruption.   ECOG = 0  0 - Asymptomatic (Fully active, able to carry on all predisease activities without restriction)  1 - Symptomatic but completely ambulatory (Restricted in physically strenuous activity but ambulatory and able to carry out work of a light or sedentary nature. For example, light housework, office work)  2 - Symptomatic, <50% in bed during the day (Ambulatory and capable of all self care but unable to carry out any work activities. Up and about more than 50% of waking hours)  3 - Symptomatic, >50% in bed, but not bedbound (Capable of only limited self-care, confined to bed or chair 50% or more of waking hours)  4 - Bedbound (Completely disabled. Cannot carry on any self-care. Totally confined to bed or chair)  5 - Death   Eustace Pen MM, Creech RH, Tormey DC, et al. [phone]). "Toxicity and response criteria of the [facility] Group". [place] Oncol. 5 (6): 649-55    LABORATORY DATA:  Lab Results  Component Value Date   WBC 15.7 (H) 10/30/2019   HGB 12.2 10/30/2019   HCT 40.2 10/30/2019   MCV 83.4 10/30/2019   PLT 609 (H) 10/30/2019   Lab Results  Component Value Date   NA 137 10/30/2019   K 3.6 10/30/2019   CL 100 10/30/2019   CO2 25 10/30/2019   Lab Results  Component Value Date   ALT 25 10/21/2019   AST 14 (L) 10/21/2019    ALKPHOS 86 10/21/2019   BILITOT <0.2 (L) 10/21/2019      RADIOGRAPHY: Nm Sentinel Node Inj-no Rpt (breast)  Result Date: 11/04/2019 Sulfur colloid was injected by the nuclear medicine technologist for melanoma sentinel node.       IMPRESSION/PLAN: 1. 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 Dr. [name] discusses the final pathology findings and reviews the nature of noninvasive and invasive breast disease. She has been healing well and is ready to proceed with adjuvant radiotherapy and antiestrogen therapy to reduce the risk of recurrence. We discussed the risks, benefits, short, and long term effects of radiotherapy, and the patient is interested in proceeding. Dr. [name] discusses the delivery and logistics of radiotherapy and recommends a course of about 6 1/2 weeks of radiotherapy. We will see her at 2 pm today to proceed with simulation at which time she will sign written consent to proceed. We did discuss the possibility of treating her prone due to pendulous breasts, but will determine this at the time of simulation.  This encounter was provided by telemedicine platform MyChart.  The patient has given verbal consent for this type of encounter and has been advised to only accept a meeting of this type in a secure network environment. The time spent during this encounter was 25 minutes. The attendants for this meeting include Dr. [name], Hayden Pedro  and Pecolia Ades.  During the encounter, Dr. [name], and Hayden Pedro were located at [facility] Radiation Oncology Department.  Ying Pennelope Bracken was located at home.    The above documentation reflects my direct findings during this shared patient visit. Please see the separate note by Dr. [name] on this date for the remainder of the patient's plan of care.    Carola Rhine, PAC

## 2019-11-25 NOTE — Progress Notes (Signed)
Patient explains she has chronic pain and migraines managed by the pain clinic. Demonstrates over MyChart video her limited ROM in her right arm. She denies fever or edema in her right arm. Patient concerned that her lumpectomy incision isn't healing well. She reports a hard sore spot in her right breast. She denies that this same area has fever or redness.

## 2019-11-26 NOTE — Progress Notes (Signed)
  Radiation Oncology         [phone] ________________________________  Name: Christina Webster MRN: [id number]  Date: 11/25/2019  DOB: 10/22/1961  DIAGNOSIS:     ICD-10-CM   1. Ductal carcinoma in situ (DCIS) of right breast  D05.11      SIMULATION AND TREATMENT PLANNING NOTE  The patient presented for simulation prior to beginning her course of radiation treatment for her diagnosis of right-sided breast cancer. The patient was placed in a supine position on a breast board. A customized vac-lock bag was constructed and this complex treatment device will be used on a daily basis during her treatment. In this fashion, a CT scan was obtained through the chest area and an isocenter was placed near the chest wall within the breast.  The patient will be planned to receive a course of radiation initially to a dose of 50.4 Gy. This will consist of a whole breast radiotherapy technique. To accomplish this, 2 customized blocks have been designed which will correspond to medial and lateral whole breast tangent fields. This treatment will be accomplished at 1.8 Gy per fraction. A forward planning technique will also be evaluated to determine if this approach improves the plan. It is anticipated that the patient will then receive a 10 Gy boost to the seroma cavity which has been contoured. This will be accomplished at 2 Gy per fraction.   This initial treatment will consist of a 3-D conformal technique. The seroma has been contoured as the primary target structure. Additionally, dose volume histograms of both this target as well as the lungs and heart will also be evaluated. Such an approach is necessary to ensure that the target area is adequately covered while the nearby critical  normal structures are adequately spared.  Plan:  The final anticipated total dose therefore will correspond to 60.4 Gy.    _______________________________   Jodelle Gross, MD, PhD

## 2019-11-26 NOTE — Progress Notes (Signed)
  Radiation Oncology         [phone] ________________________________  Name: HERMENIA WHITNER MRN: [id number]  Date: 11/25/2019  DOB: August 25, 1961  Optical Surface Tracking Plan:  Since intensity modulated radiotherapy (IMRT) and 3D conformal radiation treatment methods are predicated on accurate and precise positioning for treatment, intrafraction motion monitoring is medically necessary to ensure accurate and safe treatment delivery.  The ability to quantify intrafraction motion without excessive ionizing radiation dose can only be performed with optical surface tracking. Accordingly, surface imaging offers the opportunity to obtain 3D measurements of patient position throughout IMRT and 3D treatments without excessive radiation exposure.  I am ordering optical surface tracking for this patient's upcoming course of radiotherapy. ________________________________  Kyung Rudd, MD 11/26/2019 5:28 PM    Reference:   Particia Jasper, et al. Surface imaging-based analysis of intrafraction motion for breast radiotherapy patients.Journal of [place], n. 6, nov. 2014. ISSN [id number].   Available at: <http://www.jacmp.org/index.php/jacmp/article/view/4957>.

## 2019-11-27 ENCOUNTER — Encounter: Payer: Self-pay | Admitting: Physical Therapy

## 2019-11-27 DIAGNOSIS — Z510 Encounter for antineoplastic radiation therapy: Secondary | ICD-10-CM | POA: Diagnosis not present

## 2019-12-02 ENCOUNTER — Ambulatory Visit: Payer: Medicare Other | Admitting: Radiation Oncology

## 2019-12-03 ENCOUNTER — Ambulatory Visit: Payer: Medicare Other

## 2019-12-04 ENCOUNTER — Ambulatory Visit: Payer: Medicare Other

## 2019-12-07 ENCOUNTER — Ambulatory Visit
Admission: RE | Admit: 2019-12-07 | Discharge: 2019-12-07 | Disposition: A | Discharge status: Home / Self Care | Payer: Medicare Other | Source: Ambulatory Visit | Attending: Radiation Oncology | Admitting: Radiation Oncology

## 2019-12-07 ENCOUNTER — Other Ambulatory Visit: Payer: Self-pay

## 2019-12-07 ENCOUNTER — Encounter: Payer: Medicare Other | Admitting: Physical Therapy

## 2019-12-07 DIAGNOSIS — Z510 Encounter for antineoplastic radiation therapy: Secondary | ICD-10-CM | POA: Diagnosis not present

## 2019-12-08 ENCOUNTER — Other Ambulatory Visit: Payer: Self-pay

## 2019-12-08 ENCOUNTER — Ambulatory Visit
Admission: RE | Admit: 2019-12-08 | Discharge: 2019-12-08 | Disposition: A | Discharge status: Home / Self Care | Payer: Medicare Other | Source: Ambulatory Visit | Attending: Radiation Oncology | Admitting: Radiation Oncology

## 2019-12-08 DIAGNOSIS — Z510 Encounter for antineoplastic radiation therapy: Secondary | ICD-10-CM | POA: Diagnosis not present

## 2019-12-09 ENCOUNTER — Ambulatory Visit
Admission: RE | Admit: 2019-12-09 | Discharge: 2019-12-09 | Disposition: A | Discharge status: Home / Self Care | Payer: Medicare Other | Source: Ambulatory Visit | Attending: Radiation Oncology | Admitting: Radiation Oncology

## 2019-12-09 ENCOUNTER — Other Ambulatory Visit: Payer: Self-pay

## 2019-12-09 DIAGNOSIS — Z510 Encounter for antineoplastic radiation therapy: Secondary | ICD-10-CM | POA: Diagnosis not present

## 2019-12-10 ENCOUNTER — Ambulatory Visit
Admission: RE | Admit: 2019-12-10 | Discharge: 2019-12-10 | Disposition: A | Discharge status: Home / Self Care | Payer: Medicare Other | Source: Ambulatory Visit | Attending: Radiation Oncology | Admitting: Radiation Oncology

## 2019-12-10 DIAGNOSIS — D0511 Intraductal carcinoma in situ of right breast: Secondary | ICD-10-CM

## 2019-12-10 DIAGNOSIS — Z510 Encounter for antineoplastic radiation therapy: Secondary | ICD-10-CM | POA: Diagnosis not present

## 2019-12-10 MED ORDER — SONAFINE EX EMUL
1.0000 "application " | Freq: Once | CUTANEOUS | Status: AC
  Administered 2019-12-10: 1 via TOPICAL

## 2019-12-10 MED ORDER — ALRA NON-METALLIC DEODORANT (RAD-ONC)
1.0000 "application " | Freq: Once | TOPICAL | Status: AC
  Administered 2019-12-10: 1 via TOPICAL

## 2019-12-14 ENCOUNTER — Ambulatory Visit
Admission: RE | Admit: 2019-12-14 | Discharge: 2019-12-14 | Disposition: A | Discharge status: Home / Self Care | Payer: Medicare Other | Source: Ambulatory Visit | Attending: Radiation Oncology | Admitting: Radiation Oncology

## 2019-12-14 ENCOUNTER — Other Ambulatory Visit: Payer: Self-pay

## 2019-12-14 ENCOUNTER — Ambulatory Visit: Payer: Medicare Other | Admitting: Physical Therapy

## 2019-12-14 DIAGNOSIS — Z510 Encounter for antineoplastic radiation therapy: Secondary | ICD-10-CM | POA: Diagnosis not present

## 2019-12-15 ENCOUNTER — Other Ambulatory Visit: Payer: Self-pay

## 2019-12-15 ENCOUNTER — Ambulatory Visit
Admission: RE | Admit: 2019-12-15 | Discharge: 2019-12-15 | Disposition: A | Discharge status: Home / Self Care | Payer: Medicare Other | Source: Ambulatory Visit | Attending: Radiation Oncology | Admitting: Radiation Oncology

## 2019-12-15 DIAGNOSIS — Z510 Encounter for antineoplastic radiation therapy: Secondary | ICD-10-CM | POA: Diagnosis not present

## 2019-12-16 ENCOUNTER — Other Ambulatory Visit: Payer: Self-pay

## 2019-12-16 ENCOUNTER — Ambulatory Visit
Admission: RE | Admit: 2019-12-16 | Discharge: 2019-12-16 | Disposition: A | Discharge status: Home / Self Care | Payer: Medicare Other | Source: Ambulatory Visit | Attending: Radiation Oncology | Admitting: Radiation Oncology

## 2019-12-16 DIAGNOSIS — Z510 Encounter for antineoplastic radiation therapy: Secondary | ICD-10-CM | POA: Diagnosis not present

## 2019-12-17 ENCOUNTER — Ambulatory Visit
Admission: RE | Admit: 2019-12-17 | Discharge: 2019-12-17 | Disposition: A | Discharge status: Home / Self Care | Payer: Medicare Other | Source: Ambulatory Visit | Attending: Radiation Oncology | Admitting: Radiation Oncology

## 2019-12-17 ENCOUNTER — Other Ambulatory Visit: Payer: Self-pay

## 2019-12-17 DIAGNOSIS — Z510 Encounter for antineoplastic radiation therapy: Secondary | ICD-10-CM | POA: Diagnosis not present

## 2019-12-21 ENCOUNTER — Ambulatory Visit
Admission: RE | Admit: 2019-12-21 | Discharge: 2019-12-21 | Disposition: A | Discharge status: Home / Self Care | Payer: Medicare Other | Source: Ambulatory Visit | Attending: Radiation Oncology | Admitting: Radiation Oncology

## 2019-12-21 ENCOUNTER — Other Ambulatory Visit: Payer: Self-pay

## 2019-12-21 DIAGNOSIS — Z510 Encounter for antineoplastic radiation therapy: Secondary | ICD-10-CM | POA: Diagnosis not present

## 2019-12-21 DIAGNOSIS — D0511 Intraductal carcinoma in situ of right breast: Secondary | ICD-10-CM | POA: Diagnosis present

## 2019-12-22 ENCOUNTER — Ambulatory Visit
Admission: RE | Admit: 2019-12-22 | Discharge: 2019-12-22 | Disposition: A | Discharge status: Home / Self Care | Payer: Medicare Other | Source: Ambulatory Visit | Attending: Radiation Oncology | Admitting: Radiation Oncology

## 2019-12-22 ENCOUNTER — Other Ambulatory Visit: Payer: Self-pay

## 2019-12-23 ENCOUNTER — Other Ambulatory Visit: Payer: Self-pay

## 2019-12-23 ENCOUNTER — Ambulatory Visit
Admission: RE | Admit: 2019-12-23 | Discharge: 2019-12-23 | Disposition: A | Discharge status: Home / Self Care | Payer: Medicare Other | Source: Ambulatory Visit | Attending: Radiation Oncology | Admitting: Radiation Oncology

## 2019-12-24 ENCOUNTER — Encounter (HOSPITAL_COMMUNITY): Payer: Self-pay

## 2019-12-24 ENCOUNTER — Ambulatory Visit
Admission: RE | Admit: 2019-12-24 | Discharge: 2019-12-24 | Disposition: A | Discharge status: Home / Self Care | Payer: Medicare Other | Source: Ambulatory Visit | Attending: Radiation Oncology | Admitting: Radiation Oncology

## 2019-12-24 ENCOUNTER — Emergency Department (HOSPITAL_COMMUNITY): Payer: Medicare Other

## 2019-12-24 ENCOUNTER — Ambulatory Visit: Payer: Medicare Other

## 2019-12-24 ENCOUNTER — Emergency Department (HOSPITAL_COMMUNITY)
Admission: EM | Admit: 2019-12-24 | Discharge: 2019-12-24 | Disposition: A | Discharge status: Home / Self Care | Payer: Medicare Other | Source: Home / Self Care | Attending: Emergency Medicine | Admitting: Emergency Medicine

## 2019-12-24 DIAGNOSIS — E1140 Type 2 diabetes mellitus with diabetic neuropathy, unspecified: Secondary | ICD-10-CM | POA: Insufficient documentation

## 2019-12-24 DIAGNOSIS — M549 Dorsalgia, unspecified: Secondary | ICD-10-CM | POA: Insufficient documentation

## 2019-12-24 DIAGNOSIS — I10 Essential (primary) hypertension: Secondary | ICD-10-CM | POA: Insufficient documentation

## 2019-12-24 DIAGNOSIS — W19XXXA Unspecified fall, initial encounter: Secondary | ICD-10-CM | POA: Insufficient documentation

## 2019-12-24 DIAGNOSIS — Z794 Long term (current) use of insulin: Secondary | ICD-10-CM | POA: Insufficient documentation

## 2019-12-24 DIAGNOSIS — M62830 Muscle spasm of back: Secondary | ICD-10-CM

## 2019-12-24 DIAGNOSIS — S40019A Contusion of unspecified shoulder, initial encounter: Secondary | ICD-10-CM

## 2019-12-24 DIAGNOSIS — Z7982 Long term (current) use of aspirin: Secondary | ICD-10-CM | POA: Insufficient documentation

## 2019-12-24 DIAGNOSIS — Z79899 Other long term (current) drug therapy: Secondary | ICD-10-CM | POA: Insufficient documentation

## 2019-12-24 DIAGNOSIS — Y9389 Activity, other specified: Secondary | ICD-10-CM | POA: Insufficient documentation

## 2019-12-24 DIAGNOSIS — Y998 Other external cause status: Secondary | ICD-10-CM | POA: Insufficient documentation

## 2019-12-24 DIAGNOSIS — E119 Type 2 diabetes mellitus without complications: Secondary | ICD-10-CM | POA: Insufficient documentation

## 2019-12-24 DIAGNOSIS — M25559 Pain in unspecified hip: Secondary | ICD-10-CM | POA: Insufficient documentation

## 2019-12-24 DIAGNOSIS — S40011A Contusion of right shoulder, initial encounter: Secondary | ICD-10-CM | POA: Insufficient documentation

## 2019-12-24 DIAGNOSIS — Z853 Personal history of malignant neoplasm of breast: Secondary | ICD-10-CM | POA: Insufficient documentation

## 2019-12-24 DIAGNOSIS — Y929 Unspecified place or not applicable: Secondary | ICD-10-CM | POA: Insufficient documentation

## 2019-12-24 MED ORDER — IBUPROFEN 200 MG PO TABS
600.0000 mg | ORAL_TABLET | Freq: Once | ORAL | Status: AC
  Administered 2019-12-24: 09:00:00 600 mg via ORAL
  Filled 2019-12-24: qty 3

## 2019-12-24 MED ORDER — ACETAMINOPHEN 325 MG PO TABS
650.0000 mg | ORAL_TABLET | Freq: Once | ORAL | Status: AC
  Administered 2019-12-24: 650 mg via ORAL
  Filled 2019-12-24: qty 2

## 2019-12-24 MED ORDER — LIDOCAINE 5 % EX PTCH
1.0000 | MEDICATED_PATCH | CUTANEOUS | Status: DC
  Administered 2019-12-24: 1 via TRANSDERMAL
  Filled 2019-12-24: qty 1

## 2019-12-24 MED ORDER — CYCLOBENZAPRINE HCL 10 MG PO TABS: 10.0000 mg | ORAL_TABLET | Freq: Two times a day (BID) | ORAL | 0 refills | Status: AC | PRN

## 2019-12-24 MED ORDER — LIDOCAINE 5 % EX PTCH: 1.0000 | MEDICATED_PATCH | CUTANEOUS | 0 refills | Status: AC

## 2019-12-24 NOTE — ED Triage Notes (Signed)
Pt states she was trying to get out of the car at the cancer center when she tripped and fell, injuring her right shoulder and hip, as well as her back.

## 2019-12-24 NOTE — Discharge Instructions (Signed)
X-ray showed no bony injuries.  Suspect muscle/bony pain.  Use Tylenol, Motrin, Flexeril, lidocaine patches.

## 2019-12-24 NOTE — ED Provider Notes (Signed)
[facility] DEPT Provider Note   CSN: [id number] Arrival date & time: 12/24/19  0846     History Chief Complaint  Patient presents with  . Fall    Christina Webster is a 59 y.o. female.  The history is provided by the patient.  Fall This is a new problem. The current episode started less than 1 hour ago. The problem occurs rarely. Pertinent negatives include no chest pain, no abdominal pain, no headaches and no shortness of breath. Associated symptoms comments: Right shoulder, hip and back pain after mechanical fall from standing. No LOC. No blood thinners.. Exacerbated by: movement. Nothing relieves the symptoms. She has tried nothing for the symptoms. The treatment provided no relief.       Past Medical History:  Diagnosis Date  . Anemia   . Anxiety and depression   . Cancer [facility])    right breast can - recent dx  . Chronic back pain   . Diabetic peripheral neuropathy ([place])   . Family history of breast cancer   . Family history of stomach cancer   . Fibromyalgia   . Hypercholesteremia   . Hypertension   . Migraines    occasional   . Type II diabetes mellitus [facility])     Patient Active Problem List   Diagnosis Date Noted  . Genetic testing 10/27/2019  . Mood disorder ([place]) 10/21/2019  . Morbid obesity ([place]) 10/21/2019  . Family history of breast cancer   . Family history of stomach cancer   . 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 Chronic migraine 11/14/2016  . Sleep apnea 11/14/2016  . Hyperglycemia 12/07/2013  . Diabetes mellitus without ophthalmic manifestations ([place]) 12/07/2013  . HTN (hypertension) 12/07/2013  . Fibromyalgia 12/07/2013  . UTI (urinary tract infection) 12/07/2013    Past Surgical History:  Procedure Laterality Date  . BREAST LUMPECTOMY WITH RADIOACTIVE SEED AND SENTINEL LYMPH NODE BIOPSY Right 11/04/2019   Procedure: RIGHT BREAST LUMPECTOMY WITH RADIOACTIVE SEED AND SENTINEL LYMPH NODE  MAPPING;  Surgeon: [name], MD;  Location: [facility];  Service: General;  Laterality: Right;  . COLONOSCOPY    . TUBAL LIGATION  1988     OB History   No obstetric history on file.     Family History  Problem Relation Age of Onset  . Hypertension Other   . Diabetes Other   . Cancer Other   . Hyperlipidemia Other   . Sleep apnea Other   . Breast cancer Mother 48  . Diabetes Mother   . Hypertension Mother   . Other Father        Unsure of medical history.  . Breast cancer Maternal Aunt 60  . Breast cancer Cousin 75       maternal cousin  . Healthy Half-Sister   . Healthy Half-Brother   . Healthy Half-Sister   . Stomach cancer Maternal Aunt 64    Social History   Tobacco Use  . Smoking status: Never Smoker  . Smokeless tobacco: Never Used  Substance Use Topics  . Alcohol use: Yes    Comment: -occasional  . Drug use: No    Home Medications Prior to Admission medications   Medication Sig Start Date End Date Taking? Authorizing Provider  aspirin EC 81 MG tablet Take 81 mg by mouth daily.    [provider]  atorvastatin (LIPITOR) 10 MG tablet Take 10 mg by mouth at bedtime.    [provider]  calcium carbonate (OSCAL) 1500 (600 Ca) MG TABS tablet Take 1 tablet by mouth daily.     [provider]  cholecalciferol (VITAMIN D) 25 MCG (1000 UT) tablet Take 1,000 Units by mouth daily.    [provider]  Cyanocobalamin (VITAMIN B 12 PO) Take 2,500 mg by mouth daily.     [provider]  cyclobenzaprine (FLEXERIL) 10 MG tablet Take 1 tablet (10 mg total) by mouth 2 (two) times daily as needed for up to 29 doses for muscle spasms. 12/24/19   Gracianna Vink, DO  diclofenac sodium (VOLTAREN) 1 % GEL Apply 2 g topically daily as needed (Body).    [provider]  DULoxetine (CYMBALTA) 30 MG capsule Take 30 mg by mouth daily.     [provider]  fenofibrate (TRICOR) 48 MG tablet Take 48 mg by mouth at bedtime.      [provider]  ibuprofen (ADVIL) 800 MG tablet Take 1 tablet (800 mg total) by mouth every 8 (eight) hours as needed. Patient not taking: Reported on 11/25/2019 11/04/19   [name], MD  Insulin Lispro Prot & Lispro (HUMALOG MIX 75/25 KWIKPEN) (75-25) 100 UNIT/ML Kwikpen Inject 35-50 Units into the skin See admin instructions. 50 units in the morning and 35 units in the eveing    [provider]  lidocaine (LIDODERM) 5 % Place 1 patch onto the skin daily for 15 doses. Remove & Discard patch within 12 hours or as directed by MD 12/24/19 01/08/20  Lennice Sites, DO  losartan-hydrochlorothiazide (HYZAAR) 50-12.5 MG tablet Take 1 tablet by mouth daily.  10/28/16   [provider]  Magnesium 200 MG TABS Take 400 mg by mouth at bedtime.  07/28/19   [provider]  SUMAtriptan (IMITREX) 25 MG tablet Take 25 mg by mouth every 2 (two) hours as needed for migraine. May repeat in 2 hours if headache persists or recurs.    [provider]  SYNJARDY XR 12.04-999 MG TB24 Take 2 tablets by mouth daily.  09/24/19   [provider]  topiramate (TOPAMAX) 25 MG tablet Take 25 mg by mouth at bedtime. 09/19/19   [provider]  traMADol (ULTRAM) 50 MG tablet Take 50 mg by mouth 5 (five) times daily as needed for moderate pain.  09/19/19   [provider]  VASCEPA 0.5 g CAPS SMARTSIG:2 Capsule(s) By Mouth As Needed 11/14/19   [provider]    Allergies    Patient has no known allergies.  Review of Systems   Review of Systems  Constitutional: Negative for chills and fever.  HENT: Negative for ear pain and sore throat.   Eyes: Negative for pain and visual disturbance.  Respiratory: Negative for cough and shortness of breath.   Cardiovascular: Negative for chest pain and palpitations.  Gastrointestinal: Negative for abdominal pain and vomiting.  Genitourinary: Negative for dysuria and hematuria.  Musculoskeletal: Positive for  arthralgias and back pain. Negative for gait problem, joint swelling, myalgias, neck pain and neck stiffness.  Skin: Negative for color change and rash.  Neurological: Negative for seizures, syncope and headaches.  All other systems reviewed and are negative.   Physical Exam Updated Vital Signs  ED Triage Vitals  Enc Vitals Group     BP 12/24/19 0856 135/86     Pulse Rate 12/24/19 0856 (!) 104     Resp 12/24/19 0856 18     Temp 12/24/19 0856 98.8 F (37.1 C)     Temp Source 12/24/19  0856 Oral     SpO2 12/24/19 0856 98 %     Weight --      Height --      Head Circumference --      Peak Flow --      Pain Score 12/24/19 0855 8     Pain Loc --      Pain Edu? --      Excl. in [place]? --     Physical Exam Vitals and nursing note reviewed.  Constitutional:      General: She is not in acute distress.    Appearance: She is well-developed.  HENT:     Head: Normocephalic and atraumatic.     Nose: Nose normal.     Mouth/Throat:     Mouth: Mucous membranes are moist.  Eyes:     Extraocular Movements: Extraocular movements intact.     Conjunctiva/sclera: Conjunctivae normal.     Pupils: Pupils are equal, round, and reactive to light.  Cardiovascular:     Rate and Rhythm: Normal rate and regular rhythm.     Pulses: Normal pulses.     Heart sounds: Normal heart sounds. No murmur.  Pulmonary:     Effort: Pulmonary effort is normal. No respiratory distress.     Breath sounds: Normal breath sounds.  Abdominal:     General: Abdomen is flat.     Palpations: Abdomen is soft.     Tenderness: There is no abdominal tenderness.  Musculoskeletal:        General: Tenderness present. No deformity. Normal range of motion.     Cervical back: Normal range of motion and neck supple. No tenderness.     Comments: No midline spinal tenderness, tenderness over the right hip, right shoulder, right paraspinal muscles of lumbar spine.  No midline spinal tenderness.  Skin:    General: Skin is warm and  dry.     Capillary Refill: Capillary refill takes less than 2 seconds.  Neurological:     General: No focal deficit present.     Mental Status: She is alert and oriented to person, place, and time.     Cranial Nerves: No cranial nerve deficit.     Sensory: No sensory deficit.     Motor: No weakness.     Coordination: Coordination normal.  Psychiatric:        Mood and Affect: Mood normal.     ED Results / Procedures / Treatments   Labs (all labs ordered are listed, but only abnormal results are displayed) Labs Reviewed - No data to display  EKG None  Radiology No results found.  Procedures Procedures (including critical care time)  Medications Ordered in ED Medications  lidocaine (LIDODERM) 5 % 1 patch (1 patch Transdermal Patch Applied 12/24/19 0903)  ibuprofen (ADVIL) tablet 600 mg (600 mg Oral Given 12/24/19 0904)  acetaminophen (TYLENOL) tablet 650 mg (650 mg Oral Given 12/24/19 [id number])    ED Course  I have reviewed the triage vital signs and the nursing notes.  Pertinent labs & imaging results that were available during my care of the patient were reviewed by me and considered in my medical decision making (see chart for details).    MDM Rules/Calculators/A&P  Christina Webster is a 59 year old female with history of diabetes, anxiety, depression, chronic back pain, fibromyalgia, breast cancer who presents to the ED after a fall.  Patient with pain mostly to the right shoulder, right hip, right lower back after fall from standing.  Did not  lose consciousness.  Denies any severe headaches or neck pain.  Neurologically she is intact.  No concern for head or neck trauma.  She is ambulatory in the room.  She does not take any blood thinners.  Good range of motion at all major joints.  Mostly has some pain in the right shoulder area, right hip, right paraspinal muscles of the lumbar spine.  No midline spinal tenderness.  X-rays were obtained of the right shoulder, right hip, lower back  with no acute findings.  She was given Tylenol, Motrin, lidocaine patch recommend the same for further treatment going forward.  Likely bony contusion versus muscle strain.  Will prescribe muscle relaxant.  Discharged in good condition.   This chart was dictated using voice recognition software.  Despite best efforts to proofread,  errors can occur which can change the documentation meaning.    Final Clinical Impression(s) / ED Diagnoses Final diagnoses:  Contusion of shoulder, unspecified laterality, initial encounter  Back spasm    Rx / DC Orders ED Discharge Orders         Ordered    cyclobenzaprine (FLEXERIL) 10 MG tablet  2 times daily PRN     12/24/19 0932    lidocaine (LIDODERM) 5 %  Every 24 hours     12/24/19 0932           Lennice Sites, DO 12/24/19 [id number]

## 2019-12-25 ENCOUNTER — Ambulatory Visit
Admission: RE | Admit: 2019-12-25 | Discharge: 2019-12-25 | Disposition: A | Discharge status: Home / Self Care | Payer: Medicare Other | Source: Ambulatory Visit | Attending: Radiation Oncology | Admitting: Radiation Oncology

## 2019-12-25 ENCOUNTER — Other Ambulatory Visit: Payer: Self-pay

## 2019-12-28 ENCOUNTER — Other Ambulatory Visit: Payer: Self-pay

## 2019-12-28 ENCOUNTER — Ambulatory Visit
Admission: RE | Admit: 2019-12-28 | Discharge: 2019-12-28 | Disposition: A | Discharge status: Home / Self Care | Payer: Medicare Other | Source: Ambulatory Visit | Attending: Radiation Oncology | Admitting: Radiation Oncology

## 2019-12-29 ENCOUNTER — Ambulatory Visit
Admission: RE | Admit: 2019-12-29 | Discharge: 2019-12-29 | Disposition: A | Discharge status: Home / Self Care | Payer: Medicare Other | Source: Ambulatory Visit | Attending: Radiation Oncology | Admitting: Radiation Oncology

## 2019-12-29 ENCOUNTER — Encounter: Payer: Self-pay | Admitting: *Deleted

## 2019-12-29 ENCOUNTER — Other Ambulatory Visit: Payer: Self-pay

## 2019-12-30 ENCOUNTER — Other Ambulatory Visit: Payer: Self-pay

## 2019-12-30 ENCOUNTER — Ambulatory Visit
Admission: RE | Admit: 2019-12-30 | Discharge: 2019-12-30 | Disposition: A | Discharge status: Home / Self Care | Payer: Medicare Other | Source: Ambulatory Visit | Attending: Radiation Oncology | Admitting: Radiation Oncology

## 2019-12-31 ENCOUNTER — Encounter: Payer: Self-pay | Admitting: Physical Therapy

## 2019-12-31 ENCOUNTER — Ambulatory Visit: Payer: Medicare Other | Attending: Surgery | Admitting: Physical Therapy

## 2019-12-31 ENCOUNTER — Other Ambulatory Visit: Payer: Self-pay

## 2019-12-31 ENCOUNTER — Ambulatory Visit
Admission: RE | Admit: 2019-12-31 | Discharge: 2019-12-31 | Disposition: A | Discharge status: Home / Self Care | Payer: Medicare Other | Source: Ambulatory Visit | Attending: Radiation Oncology | Admitting: Radiation Oncology

## 2019-12-31 DIAGNOSIS — M79601 Pain in right arm: Secondary | ICD-10-CM

## 2019-12-31 DIAGNOSIS — R293 Abnormal posture: Secondary | ICD-10-CM

## 2019-12-31 DIAGNOSIS — D0511 Intraductal carcinoma in situ of right breast: Secondary | ICD-10-CM

## 2019-12-31 DIAGNOSIS — M25611 Stiffness of right shoulder, not elsewhere classified: Secondary | ICD-10-CM | POA: Diagnosis present

## 2019-12-31 NOTE — Therapy (Signed)
[place], [place], [address] Phone: [phone]   Fax:  [phone]  Physical Therapy Treatment  Patient Details  Name: Christina Webster MRN: [id number] Date of Birth: June 29, 1961 Referring Provider (PT): Dr. [name]   Encounter Date: 12/31/2019  PT End of Session - 12/31/19 1001    Visit Number  2    Number of Visits  10    Date for PT Re-Evaluation  01/28/20    PT Start Time  0904    PT Stop Time  0950    PT Time Calculation (min)  46 min    Activity Tolerance  Patient tolerated treatment well    Behavior During Therapy  [facility] for tasks assessed/performed       Past Medical History:  Diagnosis Date  . Anemia   . Anxiety and depression   . Cancer [facility])    right breast can - recent dx  . Chronic back pain   . Diabetic peripheral neuropathy ([place])   . Family history of breast cancer   . Family history of stomach cancer   . Fibromyalgia   . Hypercholesteremia   . Hypertension   . Migraines    occasional   . Type II diabetes mellitus ([place])     Past Surgical History:  Procedure Laterality Date  . BREAST LUMPECTOMY WITH RADIOACTIVE SEED AND SENTINEL LYMPH NODE BIOPSY Right 11/04/2019   Procedure: RIGHT BREAST LUMPECTOMY WITH RADIOACTIVE SEED AND SENTINEL LYMPH NODE MAPPING;  Surgeon: [name], MD;  Location: [facility];  Service: General;  Laterality: Right;  . COLONOSCOPY    . TUBAL LIGATION  1988    There were no vitals filed for this visit.  Subjective Assessment - 12/31/19 0904    Subjective  Patient reports she underwent a right lumpectomy and sentinel node biopsy (3 negative nodes) on 11/04/2019. She is currently undergoing radiation and has had 16 treatments so far. She plans to complete radiation 01/22/2020. She fell on 12/24/2019 at the hospital, went to the ED and has recovered but had a right shoulder contusion.    Pertinent History  Patient was diagnosed on 09/26/2019 with right  DCIS with concern for possible invasion. Patient reports she underwent a right lumpectomy and sentinel node biopsy (3 negative nodes) on 11/04/2019. It is ER positive and PR negative.    Patient Stated Goals  See if my arm can get better    Currently in Pain?  Yes    Pain Score  6     Pain Location  Arm    Pain Orientation  Right    Pain Descriptors / Indicators  Burning    Pain Type  Surgical pain    Pain Onset  More than a month ago    Pain Frequency  Intermittent    Aggravating Factors   Random - nothing increases or decreases pain    Pain Relieving Factors  Random - nothing increases or decreases pain         [facility] PT Assessment - 12/31/19 0001      Assessment   Medical Diagnosis  s/p right lumpectomy SLNB    Referring Provider (PT)  Dr. Marcello Moores [place]    Onset Date/Surgical Date  11/04/19    Hand Dominance  Right    Prior Therapy  Baselines      Precautions   Precautions  Other (comment)    Precaution Comments  right arm lymphedema risk      Restrictions  Weight Bearing Restrictions  No      Balance Screen   Has the patient fallen in the past 6 months  Yes    How many times?  1   [place] getting out of car at hospital due to tripping   Has the patient had a decrease in activity level because of a fear of falling?   No    Is the patient reluctant to leave their home because of a fear of falling?   No      Home Social worker  Private residence    Living Arrangements  Children   Adult daughter   Available Help at Discharge  Family      Prior Function   Level of Independence  Independent    Vocation  On disability    Leisure  Not exercising      Cognition   Overall Cognitive Status  Within Functional Limits for tasks assessed      Observation/Other Assessments   Observations  Right axillary incision well healed. Breast incision well healed but has some stuck scar tissue present creating a dimple along the lateral edge.      Posture/Postural  Control   Posture/Postural Control  Postural limitations    Postural Limitations  Rounded Shoulders;Forward head      ROM / Strength   AROM / PROM / Strength  AROM      AROM   AROM Assessment Site  Shoulder    Right/Left Shoulder  Right    Right Shoulder Extension  28 Degrees    Right Shoulder Flexion  99 Degrees    Right Shoulder ABduction  78 Degrees    Right Shoulder Internal Rotation  49 Degrees   Compensation with upper trap   Right Shoulder External Rotation  52 Degrees        LYMPHEDEMA/ONCOLOGY QUESTIONNAIRE - 12/31/19 0914      Type   Cancer Type  Right breast cancer      Surgeries   Lumpectomy Date  11/04/19    Sentinel Lymph Node Biopsy Date  11/04/19    Number Lymph Nodes Removed  3      Treatment   Active Chemotherapy Treatment  No    Past Chemotherapy Treatment  No    Active Radiation Treatment  Yes    Date  12/07/19    Body Site  right breast    Past Radiation Treatment  No    Current Hormone Treatment  No    Past Hormone Therapy  No      What other symptoms do you have   Are you Having Heaviness or Tightness  Yes    Are you having Pain  Yes    Are you having pitting edema  No    Is it Hard or Difficult finding clothes that fit  No    Do you have infections  No    Is there Decreased scar mobility  No    [place] Sign  No      Lymphedema Assessments   Lymphedema Assessments  Upper extremities      Right Upper Extremity Lymphedema   10 cm Proximal to Olecranon Process  28 cm    Olecranon Process  24.8 cm    10 cm Proximal to Ulnar Styloid Process  20.2 cm    Just Proximal to Ulnar Styloid Process  15.9 cm    Across Hand at [employer]  19.1 cm    At [place] of  2nd Digit  6.6 cm      Left Upper Extremity Lymphedema   10 cm Proximal to Olecranon Process  28.5 cm    Olecranon Process  24.7 cm    10 cm Proximal to Ulnar Styloid Process  19.9 cm    Just Proximal to Ulnar Styloid Process  15.9 cm    Across Hand at [employer]  19 cm    At  [place] of 2nd Digit  6.5 cm        Quick Dash - 12/31/19 0001    Open a tight or new jar  Moderate difficulty    Do heavy household chores (wash walls, wash floors)  Moderate difficulty    Carry a shopping bag or briefcase  Moderate difficulty    Wash your back  Mild difficulty    Use a knife to cut food  Mild difficulty    Recreational activities in which you take some force or impact through your arm, shoulder, or hand (golf, hammering, tennis)  Moderate difficulty    During the past week, to what extent has your arm, shoulder or hand problem interfered with your normal social activities with family, friends, neighbors, or groups?  Quite a bit    During the past week, to what extent has your arm, shoulder or hand problem limited your work or other regular daily activities  Modererately    Arm, shoulder, or hand pain.  Moderate    Tingling (pins and needles) in your arm, shoulder, or hand  Moderate    Difficulty Sleeping  No difficulty    DASH Score  43.18 %             [facility] Adult PT Treatment/Exercise - 12/31/19 0001      Exercises   Exercises  Shoulder      Shoulder Exercises: Pulleys   Flexion  2 minutes    ABduction  2 minutes             PT Education - 12/31/19 0930    Education Details  Reviewed and re-issued HEP given pre-op; pt perrmed with difficulty    Person(s) Educated  Patient    Methods  Explanation;Demonstration;Handout    Comprehension  Returned demonstration;Verbalized understanding          PT Long Term Goals - 12/31/19 1059      PT LONG TERM GOAL #1   Title  Patient will demonstrate she has regained full shoulder ROM and function post operatively compared to baselines.    Time  4    Period  Weeks    Status  On-going    Target Date  01/28/20      PT LONG TERM GOAL #2   Title  Patient will increase right shoulder active flexion to >/= 110 degrees for return to baseline and to reach overhead.    Baseline  99    Time  4    Period   Weeks    Status  New    Target Date  01/28/20      PT LONG TERM GOAL #3   Title  Patient will increase right shoulder active abduction to >/= 130 degrees for return to baseline and to reach overhead.    Baseline  78    Time  4    Period  Weeks    Status  New    Target Date  01/28/20      PT LONG TERM GOAL #4   Title  Patient will  improve DASH score to be </= 30 for improved overall shoudler function.    Baseline  43.18    Time  4    Period  Weeks    Target Date  01/28/20      PT LONG TERM GOAL #5   Title  Patient will verbalize good understanding of lymphedema risk reduction practices.    Time  4    Period  Weeks    Status  New    Target Date  01/28/20            Plan - 12/31/19 1001    Clinical Impression Statement  Patient had a right lumpectomy and sentinel node biopsy on 11/04/2019. She reports difficulty recovering with her shoulder ROM since that surgery and then had a fall on 12/24/2019 which injured the same arm. She will benefit from PT to improve shoulder ROM which is very limited and to try to reduce and manage pain. Her breast incision site would benefit from scar massage but we may be limited with that due to currently going through radiation. She is currently about half way through radiation.    PT Frequency  2x / week    PT Duration  4 weeks    PT Treatment/Interventions  ADLs/Self Care Home Management;Therapeutic exercise;Patient/family education;Manual techniques;Scar mobilization;Passive range of motion    PT Next Visit Plan  PROM right shoulder; breast scar massage if skin is not red; ROM exercises    PT Home Exercise Plan  Post op shoulder ROM HEP    Consulted and Agree with Plan of Care  Patient       Patient will benefit from skilled therapeutic intervention in order to improve the following deficits and impairments:  Postural dysfunction, Decreased range of motion, Decreased knowledge of precautions, Pain, Impaired UE functional use, Decreased scar  mobility  Visit Diagnosis: Ductal carcinoma in situ (DCIS) of right breast - Plan: PT plan of care cert/re-cert  Abnormal posture - Plan: PT plan of care cert/re-cert  Stiffness of right shoulder, not elsewhere classified - Plan: PT plan of care cert/re-cert  Pain in right arm - Plan: PT plan of care cert/re-cert     Problem List Patient Active Problem List   Diagnosis Date Noted  . Genetic testing 10/27/2019  . Mood disorder ([place]) 10/21/2019  . Morbid obesity ([place]) 10/21/2019  . Family history of breast cancer   . Family history of stomach cancer   . 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 Chronic migraine 11/14/2016  . Sleep apnea 11/14/2016  . Hyperglycemia 12/07/2013  . Diabetes mellitus without ophthalmic manifestations ([place]) 12/07/2013  . HTN (hypertension) 12/07/2013  . Fibromyalgia 12/07/2013  . UTI (urinary tract infection) 12/07/2013   Annia Friendly, PT 12/31/19 11:04 AM  [place], [place], [address] Phone: [phone]   Fax:  [phone]  Name: Christina Webster MRN: [id number] Date of Birth: [birth year]

## 2020-01-01 ENCOUNTER — Other Ambulatory Visit: Payer: Self-pay

## 2020-01-01 ENCOUNTER — Ambulatory Visit
Admission: RE | Admit: 2020-01-01 | Discharge: 2020-01-01 | Disposition: A | Discharge status: Home / Self Care | Payer: Medicare Other | Source: Ambulatory Visit | Attending: Radiation Oncology | Admitting: Radiation Oncology

## 2020-01-04 ENCOUNTER — Other Ambulatory Visit: Payer: Self-pay

## 2020-01-04 ENCOUNTER — Ambulatory Visit
Admission: RE | Admit: 2020-01-04 | Discharge: 2020-01-04 | Disposition: A | Discharge status: Home / Self Care | Payer: Medicare Other | Source: Ambulatory Visit | Attending: Radiation Oncology | Admitting: Radiation Oncology

## 2020-01-05 ENCOUNTER — Other Ambulatory Visit: Payer: Self-pay

## 2020-01-05 ENCOUNTER — Ambulatory Visit
Admission: RE | Admit: 2020-01-05 | Discharge: 2020-01-05 | Disposition: A | Discharge status: Home / Self Care | Payer: Medicare Other | Source: Ambulatory Visit | Attending: Radiation Oncology | Admitting: Radiation Oncology

## 2020-01-05 ENCOUNTER — Encounter: Payer: Self-pay | Admitting: Physical Therapy

## 2020-01-05 ENCOUNTER — Ambulatory Visit: Payer: Medicare Other | Admitting: Physical Therapy

## 2020-01-05 DIAGNOSIS — M25611 Stiffness of right shoulder, not elsewhere classified: Secondary | ICD-10-CM

## 2020-01-05 DIAGNOSIS — M79601 Pain in right arm: Secondary | ICD-10-CM

## 2020-01-05 DIAGNOSIS — D0511 Intraductal carcinoma in situ of right breast: Secondary | ICD-10-CM | POA: Diagnosis not present

## 2020-01-05 DIAGNOSIS — R293 Abnormal posture: Secondary | ICD-10-CM

## 2020-01-05 NOTE — Therapy (Signed)
[place], [place], [address] Phone: [phone]   Fax:  [phone]  Physical Therapy Treatment  Patient Details  Name: Christina Webster MRN: [id number] Date of Birth: [birth year] Referring Provider (PT): Dr. [name]   Encounter Date: 01/05/2020  PT End of Session - 01/05/20 1156    Visit Number  3    Number of Visits  10    Date for PT Re-Evaluation  01/28/20    PT Start Time  1104    PT Stop Time  1155    PT Time Calculation (min)  51 min    Activity Tolerance  Patient tolerated treatment well    Behavior During Therapy  [facility] for tasks assessed/performed       Past Medical History:  Diagnosis Date  . Anemia   . Anxiety and depression   . Cancer [facility])    right breast can - recent dx  . Chronic back pain   . Diabetic peripheral neuropathy ([place])   . Family history of breast cancer   . Family history of stomach cancer   . Fibromyalgia   . Hypercholesteremia   . Hypertension   . Migraines    occasional   . Type II diabetes mellitus ([place])     Past Surgical History:  Procedure Laterality Date  . BREAST LUMPECTOMY WITH RADIOACTIVE SEED AND SENTINEL LYMPH NODE BIOPSY Right 11/04/2019   Procedure: RIGHT BREAST LUMPECTOMY WITH RADIOACTIVE SEED AND SENTINEL LYMPH NODE MAPPING;  Surgeon: [name], MD;  Location: [place];  Service: General;  Laterality: Right;  . COLONOSCOPY    . TUBAL LIGATION  1988    There were no vitals filed for this visit.  Subjective Assessment - 01/05/20 1105    Subjective  I am having a lot of tightness in my shoulder and my pain is a 6/10.    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 Patient reports she underwent a right lumpectomy and sentinel node biopsy (3 negative nodes) on 11/04/2019. It is ER positive and PR negative.    Patient Stated Goals  See if my arm can get better    Currently in  Pain?  Yes    Pain Score  6     Pain Location  Shoulder    Pain Orientation  Right    Pain Descriptors / Indicators  Aching    Pain Type  Surgical pain    Pain Onset  More than a month ago                       [facility] Adult PT Treatment/Exercise - 01/05/20 0001      Exercises   Exercises  Shoulder      Shoulder Exercises: Pulleys   Flexion  2 minutes    ABduction  2 minutes      Shoulder Exercises: Therapy Ball   Flexion  Right;Other (comment)   9 reps, pt reported in aching in arm with this     Manual Therapy   Manual Therapy  Soft tissue mobilization;Myofascial release;Scapular mobilization;Passive ROM    Soft tissue mobilization  along right lumpectomy scar, to R upper arm especially in area of deltoid to decrease muscle tightness to allow for improved ROM    Myofascial Release  along right lumpectomy scar using cross hands to decrease scar tissue    Scapular Mobilization  in left sidelying to R scapula  in direction of protraction and retraction while moving arm into flexion    Passive ROM  gently to R shoulder in direction of flexion and scaption, pt was limited by pain throughout upper arm especially in deltoid                  PT Long Term Goals - 12/31/19 1059      PT LONG TERM GOAL #1   Title  Patient will demonstrate she has regained full shoulder ROM and function post operatively compared to baselines.    Time  4    Period  Weeks    Status  On-going    Target Date  01/28/20      PT LONG TERM GOAL #2   Title  Patient will increase right shoulder active flexion to >/= 110 degrees for return to baseline and to reach overhead.    Baseline  99    Time  4    Period  Weeks    Status  New    Target Date  01/28/20      PT LONG TERM GOAL #3   Title  Patient will increase right shoulder active abduction to >/= 130 degrees for return to baseline and to reach overhead.    Baseline  78    Time  4    Period  Weeks    Status  New    Target  Date  01/28/20      PT LONG TERM GOAL #4   Title  Patient will improve DASH score to be </= 30 for improved overall shoudler function.    Baseline  43.18    Time  4    Period  Weeks    Target Date  01/28/20      PT LONG TERM GOAL #5   Title  Patient will verbalize good understanding of lymphedema risk reduction practices.    Time  4    Period  Weeks    Status  New    Target Date  01/28/20            Plan - 01/05/20 1157    Clinical Impression Statement  Pt still having 6/10 pain throughout right upper arm with R shoulder flexion and abduction. She has been doing her exercises at home and reports she has soreness from these. She has increased tightness in her right deltoid during PROM so added soft tissue mobilization to R upper arm especially deltoid. Pt felt decreased pain after this and exhibited slightly improved PROM. Began scar massage to right lumpectomy scar which softened with treatment. She did not have any skin irritation today.    PT Frequency  2x / week    PT Duration  4 weeks    PT Treatment/Interventions  ADLs/Self Care Home Management;Therapeutic exercise;Patient/family education;Manual techniques;Scar mobilization;Passive range of motion    PT Next Visit Plan  STM to right deltoid prior to PROM to help decrease pain, R scapular mobs, PROM right shoulder; breast scar massage if skin is not red; ROM exercises    PT Home Exercise Plan  Post op shoulder ROM HEP    Consulted and Agree with Plan of Care  Patient       Patient will benefit from skilled therapeutic intervention in order to improve the following deficits and impairments:  Postural dysfunction, Decreased range of motion, Decreased knowledge of precautions, Pain, Impaired UE functional use, Decreased scar mobility  Visit Diagnosis: Pain in right arm  Stiffness of right shoulder, not elsewhere  classified  Abnormal posture     Problem List Patient Active Problem List   Diagnosis Date Noted  .  Genetic testing 10/27/2019  . Mood disorder ([place]) 10/21/2019  . Morbid obesity ([place]) 10/21/2019  . Family history of breast cancer   . Family history of stomach cancer   . 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 Chronic migraine 11/14/2016  . Sleep apnea 11/14/2016  . Hyperglycemia 12/07/2013  . Diabetes mellitus without ophthalmic manifestations ([place]) 12/07/2013  . HTN (hypertension) 12/07/2013  . Fibromyalgia 12/07/2013  . UTI (urinary tract infection) 12/07/2013    Allyson Sabal [facility] 01/05/2020, 12:00 PM  [place], [place], [address] Phone: [phone]   Fax:  [phone]  Name: Christina Webster MRN: [id number] Date of Birth: 1961-11-06  Manus Gunning, PT 01/05/20 12:00 PM

## 2020-01-06 ENCOUNTER — Other Ambulatory Visit: Payer: Self-pay

## 2020-01-06 ENCOUNTER — Ambulatory Visit
Admission: RE | Admit: 2020-01-06 | Discharge: 2020-01-06 | Disposition: A | Discharge status: Home / Self Care | Payer: Medicare Other | Source: Ambulatory Visit | Attending: Radiation Oncology | Admitting: Radiation Oncology

## 2020-01-07 ENCOUNTER — Other Ambulatory Visit: Payer: Self-pay

## 2020-01-07 ENCOUNTER — Ambulatory Visit: Payer: Medicare Other

## 2020-01-07 ENCOUNTER — Ambulatory Visit
Admission: RE | Admit: 2020-01-07 | Discharge: 2020-01-07 | Disposition: A | Discharge status: Home / Self Care | Payer: Medicare Other | Source: Ambulatory Visit | Attending: Radiation Oncology | Admitting: Radiation Oncology

## 2020-01-07 DIAGNOSIS — R293 Abnormal posture: Secondary | ICD-10-CM

## 2020-01-07 DIAGNOSIS — M25611 Stiffness of right shoulder, not elsewhere classified: Secondary | ICD-10-CM

## 2020-01-07 DIAGNOSIS — M79601 Pain in right arm: Secondary | ICD-10-CM

## 2020-01-07 DIAGNOSIS — D0511 Intraductal carcinoma in situ of right breast: Secondary | ICD-10-CM | POA: Diagnosis not present

## 2020-01-07 NOTE — Therapy (Signed)
[place], [place], [address] Phone: [phone]   Fax:  [phone]  Physical Therapy Treatment  Patient Details  Name: Christina Webster MRN: [id number] Date of Birth: 1961/08/08 Referring Provider (PT): Dr. Erroll Webster   Encounter Date: 01/07/2020  PT End of Session - 01/07/20 1215    Visit Number  4    Number of Visits  10    Date for PT Re-Evaluation  01/28/20    PT Start Time  1105    PT Stop Time  1204    PT Time Calculation (min)  59 min    Activity Tolerance  Patient tolerated treatment well    Behavior During Therapy  [facility] for tasks assessed/performed       Past Medical History:  Diagnosis Date  . Anemia   . Anxiety and depression   . Cancer [facility])    right breast can - recent dx  . Chronic back pain   . Diabetic peripheral neuropathy ([place])   . Family history of breast cancer   . Family history of stomach cancer   . Fibromyalgia   . Hypercholesteremia   . Hypertension   . Migraines    occasional   . Type II diabetes mellitus ([place])     Past Surgical History:  Procedure Laterality Date  . BREAST LUMPECTOMY WITH RADIOACTIVE SEED AND SENTINEL LYMPH NODE BIOPSY Right 11/04/2019   Procedure: RIGHT BREAST LUMPECTOMY WITH RADIOACTIVE SEED AND SENTINEL LYMPH NODE MAPPING;  Surgeon: Christina Luna, MD;  Location: [place];  Service: General;  Laterality: Right;  . COLONOSCOPY    . TUBAL LIGATION  1988    There were no vitals filed for this visit.  Subjective Assessment - 01/07/20 1112    Subjective  My stretches are going well except the one I have to put my hands behind my head, that really hurts my shoulder (sharp 3/10) getting my arm into that position. But I am able to climb up the wall higher and able to, with fingers clasped in front, raise my arms up higher.    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 Patient reports  she underwent a right lumpectomy and sentinel node biopsy (3 negative nodes) on 11/04/2019. It is ER positive and PR negative.    Patient Stated Goals  See if my arm can get better    Currently in Pain?  No/denies                       [facility] Adult PT Treatment/Exercise - 01/07/20 0001      Shoulder Exercises: Pulleys   Flexion  2 minutes    Flexion Limitations  Tactile cuing to decrease Rt scapular compensation    ABduction  2 minutes    ABduction Limitations  Pt with good technique      Manual Therapy   Manual Therapy  Soft tissue mobilization;Scapular mobilization;Passive ROM    Soft tissue mobilization  With thick massage cream (avoiding radiated tissue) to Rt upper arm especially in area of deltoid to decrease muscle tightness to allow for improved ROM; then at end used Biofreeze at upper arm (also being sure to avoid radiated skin)     Scapular Mobilization  in left sidelying to Rt scapula in direction of protraction and retraction while moving arm into flexion and scaption, scapula less immobile by end but still tight against ribcage  Passive ROM  gently to Rt shoulder in direction of flexion and scaption, pt was limited by pain throughout upper arm especially in deltoid with abduction, tried er but started having increased aching at deltoid so stopped                  PT Long Term Goals - 12/31/19 1059      PT LONG TERM GOAL #1   Title  Patient will demonstrate she has regained full shoulder ROM and function post operatively compared to baselines.    Time  4    Period  Weeks    Status  On-going    Target Date  01/28/20      PT LONG TERM GOAL #2   Title  Patient will increase right shoulder active flexion to >/= 110 degrees for return to baseline and to reach overhead.    Baseline  99    Time  4    Period  Weeks    Status  New    Target Date  01/28/20      PT LONG TERM GOAL #3   Title  Patient will increase right shoulder active abduction to  >/= 130 degrees for return to baseline and to reach overhead.    Baseline  78    Time  4    Period  Weeks    Status  New    Target Date  01/28/20      PT LONG TERM GOAL #4   Title  Patient will improve DASH score to be </= 30 for improved overall shoudler function.    Baseline  43.18    Time  4    Period  Weeks    Target Date  01/28/20      PT LONG TERM GOAL #5   Title  Patient will verbalize good understanding of lymphedema risk reduction practices.    Time  4    Period  Weeks    Status  New    Target Date  01/28/20            Plan - 01/07/20 1215    Clinical Impression Statement  Continued with focus on manual therapy working to decrease her Rt deltoid discomfort and tightness. This was some improved by end of session and added biofreeze to upper arm/deltoid avoiding radiated skin and educated pt to be sure to shower before radiation tomorrow to be sure, she verbalized understanding. Her upper arm pain has reduced in past few days to no more than 3/10 with placing hands behind head to stretch, no pain now with wall climbs and AA/ROM with flexion, showing good progress with reducing pain. Her skin irritation continues to look minimal and she reports not having any tenderness.    Stability/Clinical Decision Making  Stable/Uncomplicated    Rehab Potential  Good    PT Frequency  2x / week    PT Duration  4 weeks    PT Treatment/Interventions  ADLs/Self Care Home Management;Therapeutic exercise;Patient/family education;Manual techniques;Scar mobilization;Passive range of motion    PT Next Visit Plan  STM to right deltoid prior to PROM to help decrease pain/did biofreeze help at end of session?, Cont Rt scapular mobs, PROM right shoulder; breast scar massage if skin is not red; ROM exercises    PT Home Exercise Plan  Post op shoulder ROM HEP    Consulted and Agree with Plan of Care  Patient       Patient will benefit from skilled therapeutic intervention in  order to improve the  following deficits and impairments:  Postural dysfunction, Decreased range of motion, Decreased knowledge of precautions, Pain, Impaired UE functional use, Decreased scar mobility  Visit Diagnosis: Ductal carcinoma in situ (DCIS) of right breast  Abnormal posture  Stiffness of right shoulder, not elsewhere classified  Pain in right arm     Problem List Patient Active Problem List   Diagnosis Date Noted  . Genetic testing 10/27/2019  . Mood disorder ([place]) 10/21/2019  . Morbid obesity ([place]) 10/21/2019  . Family history of breast cancer   . Family history of stomach cancer   . 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 Chronic migraine 11/14/2016  . Sleep apnea 11/14/2016  . Hyperglycemia 12/07/2013  . Diabetes mellitus without ophthalmic manifestations ([place]) 12/07/2013  . HTN (hypertension) 12/07/2013  . Fibromyalgia 12/07/2013  . UTI (urinary tract infection) 12/07/2013    Otelia Limes, PTA 01/07/2020, 12:21 PM  [place], [place], [address] Phone: [phone]   Fax:  [phone]  Name: Christina Webster MRN: [id number] Date of Birth: 11-16-1961

## 2020-01-08 ENCOUNTER — Other Ambulatory Visit: Payer: Self-pay

## 2020-01-08 ENCOUNTER — Ambulatory Visit
Admission: RE | Admit: 2020-01-08 | Discharge: 2020-01-08 | Disposition: A | Discharge status: Home / Self Care | Payer: Medicare Other | Source: Ambulatory Visit | Attending: Radiation Oncology | Admitting: Radiation Oncology

## 2020-01-11 ENCOUNTER — Ambulatory Visit
Admission: RE | Admit: 2020-01-11 | Discharge: 2020-01-11 | Disposition: A | Discharge status: Home / Self Care | Payer: Medicare Other | Source: Ambulatory Visit | Attending: Radiation Oncology | Admitting: Radiation Oncology

## 2020-01-11 ENCOUNTER — Other Ambulatory Visit: Payer: Self-pay

## 2020-01-12 ENCOUNTER — Ambulatory Visit
Admission: RE | Admit: 2020-01-12 | Discharge: 2020-01-12 | Disposition: A | Discharge status: Home / Self Care | Payer: Medicare Other | Source: Ambulatory Visit | Attending: Radiation Oncology | Admitting: Radiation Oncology

## 2020-01-12 ENCOUNTER — Other Ambulatory Visit: Payer: Self-pay

## 2020-01-12 ENCOUNTER — Ambulatory Visit: Payer: Medicare Other

## 2020-01-12 DIAGNOSIS — M79601 Pain in right arm: Secondary | ICD-10-CM

## 2020-01-12 DIAGNOSIS — M25611 Stiffness of right shoulder, not elsewhere classified: Secondary | ICD-10-CM

## 2020-01-12 DIAGNOSIS — D0511 Intraductal carcinoma in situ of right breast: Secondary | ICD-10-CM | POA: Diagnosis not present

## 2020-01-12 DIAGNOSIS — R293 Abnormal posture: Secondary | ICD-10-CM

## 2020-01-12 NOTE — Patient Instructions (Signed)
SHOULDER: Flexion - Supine (Cane)        Cancer Rehab [phone]    Hold cane in both hands. Raise arms up overhead. Do not allow back to arch. Hold _5__ seconds. Do __5-10__ times; __1-2__ times a day.   SELF ASSISTED WITH OBJECT: Shoulder Abduction / Adduction - Supine    Hold cane with both hands. Move both arms from side to side, keep elbows straight.  Hold when stretch felt for __5__ seconds. Repeat __5-10__ times; __1-2__ times a day. Once this becomes easier progress to third picture bringing affected arm towards ear by staying out to side. Same hold for _5_seconds. Repeat  _5-10_ times, _1-2_ times/day.   SHOULDER: External Rotation - Supine (Cane)    Hold cane with both hands. Rotate arm away from body. Keep elbow on floor and next to body. _5-10__ reps per set, hold 5 seconds, _1-2__ sets per day. Add towel to keep elbow at side.  Copyright  VHI. All rights reserved.       

## 2020-01-12 NOTE — Therapy (Signed)
[place], [place], [address] Phone: [phone]   Fax:  [phone]  Physical Therapy Treatment  Patient Details  Name: Christina Webster MRN: [id number] Date of Birth: 05-Jan-1961 Referring Provider (PT): Dr. [name]   Encounter Date: 01/12/2020  PT End of Session - 01/12/20 1254    Visit Number  5    Number of Visits  10    Date for PT Re-Evaluation  01/28/20    PT Start Time  1006    PT Stop Time  1102    PT Time Calculation (min)  56 min    Activity Tolerance  Patient tolerated treatment well    Behavior During Therapy  [facility] for tasks assessed/performed       Past Medical History:  Diagnosis Date  . Anemia   . Anxiety and depression   . Cancer [facility])    right breast can - recent dx  . Chronic back pain   . Diabetic peripheral neuropathy ([place])   . Family history of breast cancer   . Family history of stomach cancer   . Fibromyalgia   . Hypercholesteremia   . Hypertension   . Migraines    occasional   . Type II diabetes mellitus ([place])     Past Surgical History:  Procedure Laterality Date  . BREAST LUMPECTOMY WITH RADIOACTIVE SEED AND SENTINEL LYMPH NODE BIOPSY Right 11/04/2019   Procedure: RIGHT BREAST LUMPECTOMY WITH RADIOACTIVE SEED AND SENTINEL LYMPH NODE MAPPING;  Surgeon: [name], MD;  Location: [place];  Service: General;  Laterality: Right;  . COLONOSCOPY    . TUBAL LIGATION  1988    There were no vitals filed for this visit.  Subjective Assessment - 01/12/20 1010    Subjective  I can tell my Rt sholder is getting better. The stretch where I put my fingers behind my head still hurts but I can reach higher with my wall slides. Today I think my shoulder is only bothering me because of the rain.    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 Patient reports she underwent a right lumpectomy and sentinel node  biopsy (3 negative nodes) on 11/04/2019. It is ER positive and PR negative.    Patient Stated Goals  See if my arm can get better    Currently in Pain?  Yes    Pain Score  6     Pain Location  Shoulder    Pain Orientation  Right    Pain Descriptors / Indicators  Aching    Pain Type  Surgical pain    Pain Onset  More than a month ago    Pain Frequency  Intermittent    Aggravating Factors   still random    Pain Relieving Factors  still random, though pt does report she felt wonderful after last therapy visit                       [place] Adult PT Treatment/Exercise - 01/12/20 0001      Shoulder Exercises: Supine   Horizontal ABduction  AAROM;Right;5 reps   5 sec with dowel   External Rotation  AAROM;Right;5 reps   5 sec with dowel   Flexion  AAROM;Both;5 reps   5 sec holds with dowel   Flexion Limitations  Discomfort/tightness reported at superior aspect of shoulder but pt still very guarded posturing and required VcS to relax which  helped decreased symptoms some      Shoulder Exercises: Pulleys   Flexion  2 minutes    Flexion Limitations  VCs to relax shoulders at top of stretch    Scaption  2 minutes    Scaption Limitations  VCs to relax during    ABduction  --      Manual Therapy   Soft tissue mobilization  With thick massage cream (avoiding radiated tissue) to Rt upper arm especially in area of deltoid to decrease muscle tightness to allow for improved ROM; then at end used Biofreeze at upper arm (also being sure to avoid radiated skin)     Scapular Mobilization  in left sidelying to Rt scapula in direction of protraction and retraction    Passive ROM  gently to Rt shoulder in direction of flexion, horizontal abduction and scaption, pt was limited by pain throughout upper arm especially in deltoid with abduction, also included er which pt tolerated some better today, especially when arm was slightly more abducted closer to 60 degrees than when arm was at side              PT Education - 01/12/20 1020    Education Details  Supine dowel exericses    Person(s) Educated  Patient    Methods  Explanation;Demonstration;Handout    Comprehension  Verbalized understanding;Returned demonstration;Tactile cues required;Need further instruction          PT Long Term Goals - 12/31/19 1059      PT LONG TERM GOAL #1   Title  Patient will demonstrate she has regained full shoulder ROM and function post operatively compared to baselines.    Time  4    Period  Weeks    Status  On-going    Target Date  01/28/20      PT LONG TERM GOAL #2   Title  Patient will increase right shoulder active flexion to >/= 110 degrees for return to baseline and to reach overhead.    Baseline  99    Time  4    Period  Weeks    Status  New    Target Date  01/28/20      PT LONG TERM GOAL #3   Title  Patient will increase right shoulder active abduction to >/= 130 degrees for return to baseline and to reach overhead.    Baseline  78    Time  4    Period  Weeks    Status  New    Target Date  01/28/20      PT LONG TERM GOAL #4   Title  Patient will improve DASH score to be </= 30 for improved overall shoudler function.    Baseline  43.18    Time  4    Period  Weeks    Target Date  01/28/20      PT LONG TERM GOAL #5   Title  Patient will verbalize good understanding of lymphedema risk reduction practices.    Time  4    Period  Weeks    Status  New    Target Date  01/28/20            Plan - 01/12/20 1254    Clinical Impression Statement  Continued with AA/ROM exercises as pt is tolerating these well. Progressed her HEP to include supine dowel exercises which she did okay with but reported some discomfort with flexion, but once pt reminded to relax shoulders this improved some. She also  reported some increased tightness and discomfort in Rt shoulder with pulleys but also still struggles with guarded posture. So explained this to her and she does report some  relief of symptoms when she is able to relax her shoulders. Continued with manual therapy to Rt shoulder including soft tssue work and P/ROM. Pts end P/ROM is improving though she does still c/o discomfort at superior aspect of shoulder at [facility] joint. This also did seem improved from last session though as well.    Stability/Clinical Decision Making  Stable/Uncomplicated    Rehab Potential  Good    PT Frequency  2x / week    PT Duration  4 weeks    PT Treatment/Interventions  ADLs/Self Care Home Management;Therapeutic exercise;Patient/family education;Manual techniques;Scar mobilization;Passive range of motion    PT Next Visit Plan  STM to right deltoid prior to PROM to help decrease pain, Cont Rt scapular mobs, PROM right shoulder; ROM exercises; try forearm exercises for scapular stability on wall?    PT Home Exercise Plan  Post op shoulder ROM HEP    Consulted and Agree with Plan of Care  Patient       Patient will benefit from skilled therapeutic intervention in order to improve the following deficits and impairments:  Postural dysfunction, Decreased range of motion, Decreased knowledge of precautions, Pain, Impaired UE functional use, Decreased scar mobility  Visit Diagnosis: Ductal carcinoma in situ (DCIS) of right breast  Abnormal posture  Stiffness of right shoulder, not elsewhere classified  Pain in right arm     Problem List Patient Active Problem List   Diagnosis Date Noted  . Genetic testing 10/27/2019  . Mood disorder ([place]) 10/21/2019  . Morbid obesity ([place]) 10/21/2019  . Family history of breast cancer   . Family history of stomach cancer   . 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 Chronic migraine 11/14/2016  . Sleep apnea 11/14/2016  . Hyperglycemia 12/07/2013  . Diabetes mellitus without ophthalmic manifestations ([place]) 12/07/2013  . HTN (hypertension) 12/07/2013  . Fibromyalgia 12/07/2013  . UTI (urinary tract infection) 12/07/2013     Otelia Limes, PTA 01/12/2020, 1:06 PM  [place], [place], [address] Phone: [phone]   Fax:  [phone]  Name: Christina Webster MRN: [id number] Date of Birth: 1961-10-22

## 2020-01-13 ENCOUNTER — Other Ambulatory Visit: Payer: Self-pay

## 2020-01-13 ENCOUNTER — Ambulatory Visit
Admission: RE | Admit: 2020-01-13 | Discharge: 2020-01-13 | Disposition: A | Discharge status: Home / Self Care | Payer: Medicare Other | Source: Ambulatory Visit | Attending: Radiation Oncology | Admitting: Radiation Oncology

## 2020-01-13 ENCOUNTER — Ambulatory Visit: Payer: Medicare Other

## 2020-01-14 ENCOUNTER — Ambulatory Visit: Payer: Medicare Other

## 2020-01-14 ENCOUNTER — Ambulatory Visit
Admission: RE | Admit: 2020-01-14 | Discharge: 2020-01-14 | Disposition: A | Discharge status: Home / Self Care | Payer: Medicare Other | Source: Ambulatory Visit | Attending: Radiation Oncology | Admitting: Radiation Oncology

## 2020-01-14 ENCOUNTER — Other Ambulatory Visit: Payer: Self-pay

## 2020-01-14 DIAGNOSIS — R293 Abnormal posture: Secondary | ICD-10-CM

## 2020-01-14 DIAGNOSIS — M79601 Pain in right arm: Secondary | ICD-10-CM

## 2020-01-14 DIAGNOSIS — M25611 Stiffness of right shoulder, not elsewhere classified: Secondary | ICD-10-CM

## 2020-01-14 DIAGNOSIS — D0511 Intraductal carcinoma in situ of right breast: Secondary | ICD-10-CM

## 2020-01-14 NOTE — Therapy (Signed)
[place], [place], [address] Phone: [phone]   Fax:  [phone]  Physical Therapy Treatment  Patient Details  Name: Christina Webster MRN: [id number] Date of Birth: 1961/06/30 Referring Provider (PT): Dr. [name]   Encounter Date: 01/14/2020  PT End of Session - 01/14/20 1101    Visit Number  6    Number of Visits  10    Date for PT Re-Evaluation  01/28/20    PT Start Time  1005    PT Stop Time  1059    PT Time Calculation (min)  54 min    Activity Tolerance  Patient tolerated treatment well    Behavior During Therapy  [facility] for tasks assessed/performed       Past Medical History:  Diagnosis Date  . Anemia   . Anxiety and depression   . Cancer [facility])    right breast can - recent dx  . Chronic back pain   . Diabetic peripheral neuropathy ([place])   . Family history of breast cancer   . Family history of stomach cancer   . Fibromyalgia   . Hypercholesteremia   . Hypertension   . Migraines    occasional   . Type II diabetes mellitus ([place])     Past Surgical History:  Procedure Laterality Date  . BREAST LUMPECTOMY WITH RADIOACTIVE SEED AND SENTINEL LYMPH NODE BIOPSY Right 11/04/2019   Procedure: RIGHT BREAST LUMPECTOMY WITH RADIOACTIVE SEED AND SENTINEL LYMPH NODE MAPPING;  Surgeon: [name], MD;  Location: [place] [place];  Service: General;  Laterality: Right;  . COLONOSCOPY    . TUBAL LIGATION  1988    There were no vitals filed for this visit.  Subjective Assessment - 01/14/20 1008    Subjective  My Rt shoulder, and whole upper part of my body really, is hurting alot more today from the cold weather. I started having some sharp, shooting pains in my Rt breast and the nurse said it was probably just from the new exercises we are doing.    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 Patient reports she underwent a right  lumpectomy and sentinel node biopsy (3 negative nodes) on 11/04/2019. It is ER positive and PR negative.    Patient Stated Goals  See if my arm can get better    Currently in Pain?  Yes    Pain Score  7     Pain Location  Shoulder    Pain Orientation  Right    Pain Descriptors / Indicators  Burning    Pain Type  Surgical pain    Pain Radiating Towards  upper quadrant    Pain Onset  More than a month ago    Pain Frequency  Intermittent    Aggravating Factors   today cold weather is increasing pain    Pain Relieving Factors  therapy is giving pain relief         [facility] PT Assessment - 01/14/20 0001      AROM   Right Shoulder Extension  42 Degrees    Right Shoulder Flexion  125 Degrees    Right Shoulder ABduction  90 Degrees    Right Shoulder Internal Rotation  60 Degrees   some compensation   Right Shoulder External Rotation  77 Degrees                   [facility] Adult PT Treatment/Exercise -  01/14/20 0001      Shoulder Exercises: Standing   Other Standing Exercises  Forearm slides up wall 5x returning therapist demo, then with forearms on wall alternated scapular retraction bring elbow back with arm staying at side 5x each      Shoulder Exercises: Pulleys   Flexion  2 minutes    Flexion Limitations  Pt doing well with relax shoulders today, only minor VCs for this     Scaption  2 minutes    Scaption Limitations  VCs to relax shoulders then pt able to do correctly      Manual Therapy   Soft tissue mobilization  With thick massage cream (avoiding radiated tissue) to Rt upper arm especially in area of deltoid to decrease muscle tightness to allow for improved ROM; then at end used Biofreeze at upper arm (also being sure to avoid radiated skin)     Scapular Mobilization  in left sidelying to Rt scapula in direction of protraction and retraction with and without passive er/IR to faciliatate better scapular movement    Passive ROM  gently to Rt shoulder in direction of  flexion, horizontal abduction and scaption, pt was limited by pain throughout upper arm especially in deltoid and at medial upper arm with abduction, also included er when in S/L which pt toelrated without any increased discomfort today.                   PT Long Term Goals - 12/31/19 1059      PT LONG TERM GOAL #1   Title  Patient will demonstrate she has regained full shoulder ROM and function post operatively compared to baselines.    Time  4    Period  Weeks    Status  On-going    Target Date  01/28/20      PT LONG TERM GOAL #2   Title  Patient will increase right shoulder active flexion to >/= 110 degrees for return to baseline and to reach overhead.    Baseline  99    Time  4    Period  Weeks    Status  New    Target Date  01/28/20      PT LONG TERM GOAL #3   Title  Patient will increase right shoulder active abduction to >/= 130 degrees for return to baseline and to reach overhead.    Baseline  78    Time  4    Period  Weeks    Status  New    Target Date  01/28/20      PT LONG TERM GOAL #4   Title  Patient will improve DASH score to be </= 30 for improved overall shoudler function.    Baseline  43.18    Time  4    Period  Weeks    Target Date  01/28/20      PT LONG TERM GOAL #5   Title  Patient will verbalize good understanding of lymphedema risk reduction practices.    Time  4    Period  Weeks    Status  New    Target Date  01/28/20            Plan - 01/14/20 1101    Clinical Impression Statement  Progressed pt today to include scapular stability exercises with forearms on wall. She toelrated this very well without any increased pain and in fact, reported this feeling good and was able to demonstrate good Rt shoulder ROM  as well. Her A/ROM has improved well showing good progress towards goals since last measured. Also continued with manual therapy working to decrease muscle guarding and to imncrease her ROM. Her P/ROM is improving well as well as  pt is doing better wiht being able to relax during stretching. She reports at end of session feeling much better and that she can tell her motion is improving.    Stability/Clinical Decision Making  Stable/Uncomplicated    Rehab Potential  Good    PT Frequency  2x / week    PT Duration  4 weeks    PT Treatment/Interventions  ADLs/Self Care Home Management;Therapeutic exercise;Patient/family education;Manual techniques;Scar mobilization;Passive range of motion    PT Next Visit Plan  Cont forearm exs on wall and add yellow theraband, if pt still tolerates well add to HEP; cont STM to right deltoid to help decrease pain, Cont Rt scapular mobs, PROM right shoulder; ROM exercises;    PT Home Exercise Plan  Post op shoulder ROM HEP    Consulted and Agree with Plan of Care  Patient       Patient will benefit from skilled therapeutic intervention in order to improve the following deficits and impairments:  Postural dysfunction, Decreased range of motion, Decreased knowledge of precautions, Pain, Impaired UE functional use, Decreased scar mobility  Visit Diagnosis: Ductal carcinoma in situ (DCIS) of right breast  Abnormal posture  Stiffness of right shoulder, not elsewhere classified  Pain in right arm     Problem List Patient Active Problem List   Diagnosis Date Noted  . Genetic testing 10/27/2019  . Mood disorder ([place]) 10/21/2019  . Morbid obesity ([place]) 10/21/2019  . Family history of breast cancer   . Family history of stomach cancer   . 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 Chronic migraine 11/14/2016  . Sleep apnea 11/14/2016  . Hyperglycemia 12/07/2013  . Diabetes mellitus without ophthalmic manifestations ([place]) 12/07/2013  . HTN (hypertension) 12/07/2013  . Fibromyalgia 12/07/2013  . UTI (urinary tract infection) 12/07/2013    Otelia Limes, PTA 01/14/2020, 11:06 AM  [place], [place], [address] Phone: [phone]   Fax:  [phone]  Name: Christina Webster MRN: [id number] Date of Birth: 05-28-1961

## 2020-01-15 ENCOUNTER — Ambulatory Visit: Payer: Medicare Other

## 2020-01-15 ENCOUNTER — Other Ambulatory Visit: Payer: Self-pay

## 2020-01-15 ENCOUNTER — Ambulatory Visit
Admission: RE | Admit: 2020-01-15 | Discharge: 2020-01-15 | Disposition: A | Discharge status: Home / Self Care | Payer: Medicare Other | Source: Ambulatory Visit | Attending: Radiation Oncology | Admitting: Radiation Oncology

## 2020-01-15 ENCOUNTER — Ambulatory Visit: Payer: Medicare Other | Admitting: Radiation Oncology

## 2020-01-18 ENCOUNTER — Ambulatory Visit: Payer: Medicare Other

## 2020-01-18 ENCOUNTER — Ambulatory Visit
Admission: RE | Admit: 2020-01-18 | Discharge: 2020-01-18 | Disposition: A | Discharge status: Home / Self Care | Payer: Medicare Other | Source: Ambulatory Visit | Attending: Radiation Oncology | Admitting: Radiation Oncology

## 2020-01-18 ENCOUNTER — Other Ambulatory Visit: Payer: Self-pay

## 2020-01-18 DIAGNOSIS — D0511 Intraductal carcinoma in situ of right breast: Secondary | ICD-10-CM | POA: Insufficient documentation

## 2020-01-18 DIAGNOSIS — Z510 Encounter for antineoplastic radiation therapy: Secondary | ICD-10-CM | POA: Insufficient documentation

## 2020-01-19 ENCOUNTER — Other Ambulatory Visit: Payer: Self-pay

## 2020-01-19 ENCOUNTER — Ambulatory Visit: Payer: Medicare Other

## 2020-01-19 ENCOUNTER — Ambulatory Visit
Admission: RE | Admit: 2020-01-19 | Discharge: 2020-01-19 | Disposition: A | Discharge status: Home / Self Care | Payer: Medicare Other | Source: Ambulatory Visit | Attending: Radiation Oncology | Admitting: Radiation Oncology

## 2020-01-19 ENCOUNTER — Ambulatory Visit: Payer: Medicare Other | Attending: Surgery

## 2020-01-19 DIAGNOSIS — D0511 Intraductal carcinoma in situ of right breast: Secondary | ICD-10-CM | POA: Insufficient documentation

## 2020-01-19 DIAGNOSIS — M25611 Stiffness of right shoulder, not elsewhere classified: Secondary | ICD-10-CM

## 2020-01-19 DIAGNOSIS — M79601 Pain in right arm: Secondary | ICD-10-CM | POA: Diagnosis present

## 2020-01-19 DIAGNOSIS — R293 Abnormal posture: Secondary | ICD-10-CM | POA: Diagnosis present

## 2020-01-19 DIAGNOSIS — Z510 Encounter for antineoplastic radiation therapy: Secondary | ICD-10-CM | POA: Diagnosis not present

## 2020-01-19 NOTE — Patient Instructions (Addendum)
Over Head Pull: Narrow and Wide Grip   Cancer Rehab [phone]   On back, knees bent, feet flat, band across thighs, elbows straight but relaxed. Pull hands apart (start). Keeping elbows straight, bring arms up and over head, hands toward floor. Keep pull steady on band. Hold momentarily. Return slowly, keeping pull steady, back to start. Then do same with a wider grip on the band (past shoulder width) Repeat _5-10__ times. Band color __yellow____   Side Pull: Double Arm   On back, knees bent, feet flat. Arms perpendicular to body, shoulder level, elbows straight but relaxed. Pull arms out to sides, elbows straight. Resistance band comes across collarbones, hands toward floor. Hold momentarily. Slowly return to starting position. Repeat _5-10__ times. Band color _yellow____   Sword   On back, knees bent, feet flat, left hand on left hip, right hand above left. Pull right arm DIAGONALLY (hip to shoulder) across chest. Bring right arm along head toward floor. Hold momentarily. Slowly return to starting position. Repeat _5-10__ times. Do with left arm. Band color _yellow_____   Shoulder Rotation: Double Arm   On back, knees bent, feet flat, elbows tucked at sides, bent 90, hands palms up. Pull hands apart and down toward floor, keeping elbows near sides. Hold momentarily. Slowly return to starting position. Repeat _5-10__ times. Band color __yellow____    Then holding theraband in hands and having forearms on wall:  -walk forearms up wall and then back down keeping elbows on wall  -pull elbow down to side squeezing shoulder blade towards spine, return to wall and then alternate  Doing both 5x each

## 2020-01-19 NOTE — Therapy (Signed)
[place], [place], [address] Phone: [phone]   Fax:  [phone]  Physical Therapy Treatment  Patient Details  Name: Christina Webster MRN: [id number] Date of Birth: [birth year] Referring Provider (PT): Dr. [name]   Encounter Date: 01/19/2020  PT End of Session - 01/19/20 1103    Visit Number  7    Number of Visits  10    Date for PT Re-Evaluation  01/28/20    PT Start Time  1006    PT Stop Time  1101    PT Time Calculation (min)  55 min    Activity Tolerance  Patient tolerated treatment well    Behavior During Therapy  [facility] for tasks assessed/performed       Past Medical History:  Diagnosis Date  . Anemia   . Anxiety and depression   . Cancer [facility])    right breast can - recent dx  . Chronic back pain   . Diabetic peripheral neuropathy ([place])   . Family history of breast cancer   . Family history of stomach cancer   . Fibromyalgia   . Hypercholesteremia   . Hypertension   . Migraines    occasional   . Type II diabetes mellitus ([place])     Past Surgical History:  Procedure Laterality Date  . BREAST LUMPECTOMY WITH RADIOACTIVE SEED AND SENTINEL LYMPH NODE BIOPSY Right 11/04/2019   Procedure: RIGHT BREAST LUMPECTOMY WITH RADIOACTIVE SEED AND SENTINEL LYMPH NODE MAPPING;  Surgeon: [name], MD;  Location: [place];  Service: General;  Laterality: Right;  . COLONOSCOPY    . TUBAL LIGATION  1988    There were no vitals filed for this visit.  Subjective Assessment - 01/19/20 1010    Subjective  I can tell my Rt shoulder is getting better. It only hurts now when I try to reach out to the side and up (demonstrates reaching behind head) but that is improving as well.    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 Patient reports she underwent a right lumpectomy and sentinel node biopsy (3 negative nodes) on 11/04/2019. It is  ER positive and PR negative.    Patient Stated Goals  See if my arm can get better    Currently in Pain?  No/denies         [facility] PT Assessment - 01/19/20 0001      AROM   Right Shoulder Flexion  130 Degrees    Right Shoulder ABduction  104 Degrees                   [facility] Adult PT Treatment/Exercise - 01/19/20 0001      Shoulder Exercises: Supine   Horizontal ABduction  Strengthening;Both;5 reps;Theraband    Theraband Level (Shoulder Horizontal ABduction)  Level 1 (Yellow)    External Rotation  Strengthening;Both;5 reps;Theraband    Theraband Level (Shoulder External Rotation)  Level 1 (Yellow)    Flexion  Strengthening;Both;5 reps;Theraband   Narrow and Wide Grip, 5 times each   Theraband Level (Shoulder Flexion)  Level 1 (Yellow)    Flexion Limitations  Some pain with wide grip at end [facility] instructed pt to limit motion, then no longer felt pain/discomfort    Diagonals  Strengthening;Right;Left;5 reps;Theraband    Theraband Level (Shoulder Diagonals)  Level 1 (Yellow)    Diagonals Limitations  Tactile cuing after demo for correct technique; pt returned therapist demo of  all above scapular series      Shoulder Exercises: Standing   Other Standing Exercises  With yellow theraband: Forearm walking up wall 5x returning therapist demo, then with forearms on wall alternated scapular retraction bring elbow back with arm staying at side 5x each      Shoulder Exercises: Pulleys   Flexion  2 minutes    Flexion Limitations  VCs to relax Rt shoulder    Scaption  2 minutes    Scaption Limitations  Improved ROM but still VCs to relax Rt shoulder      Shoulder Exercises: Therapy Ball   Flexion  Both;10 reps   forward lean into end of stretch   Flexion Limitations  pt reports good stretch felt      Shoulder Exercises: Stretch   Wall Stretch - ABduction  5 reps   back agaisnt wall for "snow angel", 5 sec holds   Wall Stretch - ABduction Limitations  pt with increased lumbar  lordosis so unable to attain flat back on wall      Manual Therapy   Soft tissue mobilization  With thick massage cream (avoiding radiated tissue) to Rt upper arm especially in area of deltoid to decrease muscle tightness to allow for improved ROM    Passive ROM  gently to Rt shoulder in direction of flexion, horizontal abduction and scaption, pt was limited by pain throughout upper arm especially in deltoid and at medial upper arm with abduction, also included er when in S/L which pt toelrated without any increased discomfort today.              PT Education - 01/19/20 1037    Education Details  Supine scapular series    Person(s) Educated  Patient    Methods  Explanation;Demonstration;Handout    Comprehension  Verbalized understanding;Returned demonstration;Verbal cues required;Tactile cues required;Need further instruction          PT Long Term Goals - 12/31/19 1059      PT LONG TERM GOAL #1   Title  Patient will demonstrate she has regained full shoulder ROM and function post operatively compared to baselines.    Time  4    Period  Weeks    Status  On-going    Target Date  01/28/20      PT LONG TERM GOAL #2   Title  Patient will increase right shoulder active flexion to >/= 110 degrees for return to baseline and to reach overhead.    Baseline  99    Time  4    Period  Weeks    Status  New    Target Date  01/28/20      PT LONG TERM GOAL #3   Title  Patient will increase right shoulder active abduction to >/= 130 degrees for return to baseline and to reach overhead.    Baseline  78    Time  4    Period  Weeks    Status  New    Target Date  01/28/20      PT LONG TERM GOAL #4   Title  Patient will improve DASH score to be </= 30 for improved overall shoudler function.    Baseline  43.18    Time  4    Period  Weeks    Target Date  01/28/20      PT LONG TERM GOAL #5   Title  Patient will verbalize good understanding of lymphedema risk reduction practices.     Time  4    Period  Weeks    Status  New    Target Date  01/28/20            Plan - 01/19/20 1103    Clinical Impression Statement  Progressed pts HEP to include supine scapular series and forearms on wall activities. She tolerated both very well, except some pain reported with wide grip so instructed her to limit ROM and this decreased pain. She still is limited at her end P/ROM by pain in deltoid, though her ROM is improving slowly each week.    Stability/Clinical Decision Making  Stable/Uncomplicated    Rehab Potential  Good    PT Frequency  2x / week    PT Duration  4 weeks    PT Treatment/Interventions  ADLs/Self Care Home Management;Therapeutic exercise;Patient/family education;Manual techniques;Scar mobilization;Passive range of motion    PT Next Visit Plan  Pt plans to D/C next session so reassess goals. Cont forearm exs on wall with yellow theraband; cont STM to right deltoid to help decrease pain, Cont Rt scapular mobs, PROM right shoulder; ROM exercises;    PT Home Exercise Plan  Post op shoulder ROM HEP; supine scapular series and forearms on wall for scapular stability    Consulted and Agree with Plan of Care  Patient       Patient will benefit from skilled therapeutic intervention in order to improve the following deficits and impairments:  Postural dysfunction, Decreased range of motion, Decreased knowledge of precautions, Pain, Impaired UE functional use, Decreased scar mobility  Visit Diagnosis: Ductal carcinoma in situ (DCIS) of right breast  Abnormal posture  Stiffness of right shoulder, not elsewhere classified  Pain in right arm     Problem List Patient Active Problem List   Diagnosis Date Noted  . Genetic testing 10/27/2019  . Mood disorder ([place]) 10/21/2019  . Morbid obesity ([place]) 10/21/2019  . Family history of breast cancer   . Family history of stomach cancer   . 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 Chronic migraine  11/14/2016  . Sleep apnea 11/14/2016  . Hyperglycemia 12/07/2013  . Diabetes mellitus without ophthalmic manifestations ([place]) 12/07/2013  . HTN (hypertension) 12/07/2013  . Fibromyalgia 12/07/2013  . UTI (urinary tract infection) 12/07/2013    Otelia Limes, PTA 01/19/2020, 12:14 PM  [place], [place], [address] Phone: [phone]   Fax:  [phone]  Name: AIRIANA COLLEDGE MRN: [id number] Date of Birth: 11-23-1961

## 2020-01-20 ENCOUNTER — Ambulatory Visit
Admission: RE | Admit: 2020-01-20 | Discharge: 2020-01-20 | Disposition: A | Discharge status: Home / Self Care | Payer: Medicare Other | Source: Ambulatory Visit | Attending: Radiation Oncology | Admitting: Radiation Oncology

## 2020-01-20 ENCOUNTER — Other Ambulatory Visit: Payer: Self-pay

## 2020-01-20 DIAGNOSIS — Z510 Encounter for antineoplastic radiation therapy: Secondary | ICD-10-CM | POA: Diagnosis not present

## 2020-01-20 NOTE — Progress Notes (Signed)
[place]  Telephone:(336) [phone] Fax:(336) [phone]     ID: Christina Webster DOB: 1960/12/28  MR#: [id number]  JOI#:[id number]  Patient Care Team: Simona Huh, NP as PCP - General (Nurse Practitioner) Mauro Kaufmann, RN as Oncology Nurse Navigator Rockwell Germany, RN as Oncology Nurse Navigator Mansouraty, [name], MD as Consulting Physician (Gastroenterology) Kahlel Peake, Virgie Dad, MD as Consulting Physician (Oncology) Kyung Rudd, MD as Consulting Physician (Radiation Oncology) [name], MD as Consulting Physician (General Surgery) Chauncey Cruel, MD OTHER MD:  CHIEF COMPLAINT: Ductal carcinoma in situ  CURRENT TREATMENT: Completing adjuvant radiation treatments; to start anastrozole   INTERVAL HISTORY: Christina "Candy" returns today for follow up of her noninvasive breast cancer. She was seen in the multidisciplinary breast clinic on 10/21/2019.  She underwent genetic counseling during breast clinic. Her results were negative with two variants of uncertain significance detected: one in the MSH3 gene called c.1502G>A, and one in the POLD1 gene called c.3290G>A.   She opted to proceed with right lumpectomy and sentinel lymph node biopsy on 11/04/2019 under Dr. Brantley Webster. Pathology from the procedure [id number]) showed: ductal carcinoma in situ, intermediate grade, 1.1 cm; margins uninvolved by carcinoma.  A total of three right axillary lymph nodes were biopsied, and all were negative for carcinoma (0/3).  She was then referred back to Dr. Lisbeth Webster for radiation therapy. She began treatment on 12/07/2019 and will finish tomorrow, 01/22/2020.  She unfortunately tripped and fell outside of the [facility] on her way to treatment on 12/24/2019. She underwent right shoulder, right hip, and lumbar spine x-rays. All of these showed degenerative changes without acute findings.   REVIEW OF SYSTEMS: [name] is doing well with her radiation.  Initially she felt  tired but she says now she is not.  As she is taking care of her grandchildren and that is what she does for exercise.  She denies unusual headaches visual changes cough phlegm production pleurisy or shortness of breath or change in bowel or bladder habits.  A detailed review of systems was otherwise stable.   HISTORY OF CURRENT ILLNESS:    From the original intake note:  Christina Webster" had routine screening mammography on 09/26/2019 showing a possible abnormality in the right breast. She underwent right diagnostic mammography with tomography at [facility] on 10/01/2019 showing: breast density category C; 1.4 cm linear calcifications in the right breast, upper-outer quadrant.  Accordingly on 10/14/2019 she proceeded to biopsy of the right breast area in question. 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 Prognostic indicators significant for: estrogen receptor, 100% positive with strong staining intensity and progesterone receptor, 0% negative.   The patient's subsequent history is as detailed below.   PAST MEDICAL HISTORY: Past Medical History:  Diagnosis Date  . Anemia   . Anxiety and depression   . Cancer [facility])    right breast can - recent dx  . Chronic back pain   . Diabetic peripheral neuropathy ([place])   . Family history of breast cancer   . Family history of stomach cancer   . Fibromyalgia   . Hypercholesteremia   . Hypertension   . Migraines    occasional   . Type II diabetes mellitus ([place])     PAST SURGICAL HISTORY: Past Surgical History:  Procedure Laterality Date  . BREAST LUMPECTOMY WITH RADIOACTIVE SEED AND SENTINEL LYMPH NODE BIOPSY Right 11/04/2019   Procedure: RIGHT BREAST LUMPECTOMY WITH RADIOACTIVE  SEED AND SENTINEL LYMPH NODE MAPPING;  Surgeon: [name], MD;  Location: [facility];  Service: General;  Laterality: Right;  . COLONOSCOPY    . TUBAL LIGATION  1988     FAMILY HISTORY: Family History  Problem Relation Age of Onset  . Hypertension Other   . Diabetes Other   . Cancer Other   . Hyperlipidemia Other   . Sleep apnea Other   . Breast cancer Mother 70  . Diabetes Mother   . Hypertension Mother   . Other Father        Unsure of medical history.  . Breast cancer Maternal Aunt 60  . Breast cancer Cousin 68       maternal cousin  . Healthy Half-Sister   . Healthy Half-Brother   . Healthy Half-Sister   . Stomach cancer Maternal Aunt 64   The patient has no information regarding her father or his side of the family.  Patient's mother is 60 years old as of November 2020.  The patient has 1 brother, 2 sisters.  The patient's mother had breast cancer at age 61.  There is also a maternal aunt and 2 maternal cousins with breast cancer.   GYNECOLOGIC HISTORY:  Patient's last menstrual period was 11/24/2013 (lmp unknown). Menarche: 59 years old Age at first live birth: 59 years old [place] P 3 LMP 2014 HRT no  Hysterectomy? no BSO? no   SOCIAL HISTORY: (updated 10/2019)  Aurora "Candy" is disabled secondary to fibromyalgia. She is single. She lives at home with her daughters Christina Webster and Christina Webster and 4 grandchildren.  Daughter Christina Webster lives in [place] and currently is unemployed but has worked at a call center.  Daughter Christina Webster works as a Radiation protection practitioner and daughter Christina Webster also work lives in [place] and works as a Freight forwarder from home.  The patient is not a church attender    ADVANCED DIRECTIVES: Not in place but she intends to name her daughter Christina Webster as healthcare power of attorney.  Christina Webster can be reached at [phone]   HEALTH MAINTENANCE: Social History   Tobacco Use  . Smoking status: Never Smoker  . Smokeless tobacco: Never Used  Substance Use Topics  . Alcohol use: Yes    Comment: -occasional  . Drug use: No     Colonoscopy: 08/2018 (Dr. Rush Landmark), repeat 2029  PAP: none on file  Bone density: none on file   No Known  Allergies  Current Outpatient Medications  Medication Sig Dispense Refill  . aspirin EC 81 MG tablet Take 81 mg by mouth daily.    Marland Kitchen atorvastatin (LIPITOR) 10 MG tablet Take 10 mg by mouth at bedtime.    . calcium carbonate (OSCAL) 1500 (600 Ca) MG TABS tablet Take 1 tablet by mouth daily.     . cholecalciferol (VITAMIN D) 25 MCG (1000 UT) tablet Take 1,000 Units by mouth daily.    . Cyanocobalamin (VITAMIN B 12 PO) Take 2,500 mg by mouth daily.     . cyclobenzaprine (FLEXERIL) 10 MG tablet Take 1 tablet (10 mg total) by mouth 2 (two) times daily as needed for up to 29 doses for muscle spasms. 20 tablet 0  . diclofenac sodium (VOLTAREN) 1 % GEL Apply 2 g topically daily as needed (Body).    . DULoxetine (CYMBALTA) 30 MG capsule Take 30 mg by mouth daily.     . fenofibrate (TRICOR) 48 MG tablet Take 48 mg by mouth at bedtime.     Marland Kitchen ibuprofen (ADVIL) 800 MG tablet Take  1 tablet (800 mg total) by mouth every 8 (eight) hours as needed. (Patient not taking: Reported on 11/25/2019) 30 tablet 0  . Insulin Lispro Prot & Lispro (HUMALOG MIX 75/25 KWIKPEN) (75-25) 100 UNIT/ML Kwikpen Inject 35-50 Units into the skin See admin instructions. 50 units in the morning and 35 units in the eveing    . losartan-hydrochlorothiazide (HYZAAR) 50-12.5 MG tablet Take 1 tablet by mouth daily.   3  . Magnesium 200 MG TABS Take 400 mg by mouth at bedtime.     . SUMAtriptan (IMITREX) 25 MG tablet Take 25 mg by mouth every 2 (two) hours as needed for migraine. May repeat in 2 hours if headache persists or recurs.    Marland Kitchen SYNJARDY XR 12.04-999 MG TB24 Take 2 tablets by mouth daily.     Marland Kitchen topiramate (TOPAMAX) 25 MG tablet Take 25 mg by mouth at bedtime.    . traMADol (ULTRAM) 50 MG tablet Take 50 mg by mouth 5 (five) times daily as needed for moderate pain.     Marland Kitchen VASCEPA 0.5 g CAPS SMARTSIG:2 Capsule(s) By Mouth As Needed     No current facility-administered medications for this visit.    OBJECTIVE: Middle-aged  African-American woman in no acute distress  Vitals:   01/21/20 1150  BP: 114/70  Pulse: (!) 102  Resp: 18  Temp: 98 F (36.7 C)  SpO2: 100%     Body mass index is 37.97 kg/m.   Wt Readings from Last 3 Encounters:  01/21/20 194 lb 6.4 oz (88.2 kg)  11/25/19 199 lb (90.3 kg)  11/04/19 192 lb 3.9 oz (87.2 kg)      ECOG FS:1 - Symptomatic but completely ambulatory  Sclerae unicteric, EOMs intact Wearing a mask No cervical or supraclavicular adenopathy Lungs no rales or rhonchi Heart regular rate and rhythm Abd soft, nontender, positive bowel sounds MSK no focal spinal tenderness, no upper extremity lymphedema Neuro: nonfocal, well oriented, appropriate affect Breasts: The right breast is status post lumpectomy.  The incisions are healing nicely.  The cosmetic result is good.  There is significant hyperpigmentation and minimal desquamation secondary to the ongoing radiation.  Left breast is benign.  Both axillae are benign.   LAB RESULTS:  CMP     Component Value Date/Time   NA 137 10/30/2019 0853   K 3.6 10/30/2019 0853   CL 100 10/30/2019 0853   CO2 25 10/30/2019 0853   GLUCOSE 130 (H) 10/30/2019 0853   BUN 16 10/30/2019 0853   CREATININE 1.15 (H) 10/30/2019 0853   CREATININE 1.37 (H) 10/21/2019 1232   CALCIUM 9.8 10/30/2019 0853   PROT 8.5 (H) 10/21/2019 1232   ALBUMIN 4.0 10/21/2019 1232   AST 14 (L) 10/21/2019 1232   ALT 25 10/21/2019 1232   ALKPHOS 86 10/21/2019 1232   BILITOT <0.2 (L) 10/21/2019 1232   GFRNONAA 53 (L) 10/30/2019 0853   GFRNONAA 43 (L) 10/21/2019 1232   GFRAA >60 10/30/2019 0853   GFRAA 49 (L) 10/21/2019 1232    No results found for: TOTALPROTELP, ALBUMINELP, A1GS, A2GS, BETS, BETA2SER, GAMS, MSPIKE, SPEI  No results found for: KPAFRELGTCHN, LAMBDASER, KAPLAMBRATIO  Lab Results  Component Value Date   WBC 15.7 (H) 10/30/2019   NEUTROABS 15.0 (H) 10/21/2019   HGB 12.2 10/30/2019   HCT 40.2 10/30/2019   MCV 83.4 10/30/2019   PLT 609  (H) 10/30/2019    '@LASTCHEMISTRY' @  No results found for: LABCA2  No components found for: [id number]  No results for input(s):  INR in the last 168 hours.  No results found for: LABCA2  No results found for: LNL892  No results found for: JJH417  No results found for: EYC144  No results found for: CA2729  No components found for: HGQUANT  No results found for: CEA1 / No results found for: CEA1   No results found for: AFPTUMOR  No results found for: CHROMOGRNA  No results found for: HGBA, HGBA2QUANT, HGBFQUANT, HGBSQUAN (Hemoglobinopathy evaluation)   No results found for: LDH  No results found for: IRON, TIBC, IRONPCTSAT (Iron and TIBC)  No results found for: FERRITIN  Urinalysis    Component Value Date/Time   COLORURINE YELLOW 12/07/2013 1234   APPEARANCEUR TURBID (A) 12/07/2013 1234   LABSPEC 1.035 (H) 12/07/2013 1234   PHURINE 5.5 12/07/2013 1234   GLUCOSEU >1000 (A) 12/07/2013 1234   HGBUR MODERATE (A) 12/07/2013 1234   BILIRUBINUR NEGATIVE 12/07/2013 [place] 12/07/2013 1234   PROTEINUR 30 (A) 12/07/2013 1234   UROBILINOGEN 0.2 12/07/2013 1234   NITRITE NEGATIVE 12/07/2013 1234   LEUKOCYTESUR LARGE (A) 12/07/2013 1234    STUDIES: DG Lumbar Spine Complete  Result Date: 12/24/2019 CLINICAL DATA:  Pt c/o right sided shoulder pain, right hip pain, and right low back pain s/p reportedly tripping/falling outside cancer center today when she arrived for treatment. Pt is being treated for breast cancer.pain EXAM: LUMBAR SPINE - COMPLETE 4+ VIEW COMPARISON:  None. FINDINGS: Normal alignment of lumbar vertebral bodies. No loss of vertebral body height or disc height. No pars fracture. No subluxation. IMPRESSION: No acute findings lumbar spine. Electronically Signed   By: Suzy Bouchard M.D.   On: 12/24/2019 09:36   DG Shoulder Right  Result Date: 12/24/2019 CLINICAL DATA:  Right shoulder pain, breast cancer history. EXAM: RIGHT SHOULDER - 2+  VIEW COMPARISON:  None FINDINGS: Signs of mild glenohumeral degenerative change. No signs of acute fracture or destructive bone process. Shoulder is located. Postoperative changes in right breast or axilla are seen on the AP view. IMPRESSION: Mild glenohumeral degenerative change. No acute fracture or malalignment of the right shoulder. Electronically Signed   By: Zetta Bills M.D.   On: 12/24/2019 09:32   DG Hip Unilat With Pelvis 2-3 Views Right  Result Date: 12/24/2019 CLINICAL DATA:  Hip pain. EXAM: DG HIP (WITH OR WITHOUT PELVIS) 2-3V RIGHT COMPARISON:  CT 01/20/2013. FINDINGS: Degenerative change lumbar spine and both hips. No acute bony or joint abnormality identified. No evidence of fracture or dislocation. Pelvic calcifications consistent with phleboliths. IMPRESSION: Degenerative changes lumbar spine and both hips. No acute bony abnormality identified. No evidence of fracture or dislocation. Electronically Signed   By: Marcello Moores  Register   On: 12/24/2019 09:34    ELIGIBLE FOR AVAILABLE RESEARCH PROTOCOL: Not a comment candidate  ASSESSMENT: 59 y.o. [place] woman status post right breast biopsy 10/14/2023 ductal carcinoma in situ, grade 2 or 3, measuring 1.4 cm, estrogen and progesterone receptor positive  (1) genetics testing  (a) Negative genetic testing:  No pathogenic variants detected on the [facility] Breast Cancer STAT panel. The report date is 10/27/2019. No pathogenic variants detected on the [facility] Common Hereditary Cancers panel. Two variants of uncertain significance were detected: one in the MSH3 gene called c.1502G>A, and one in the POLD1 gene called c.3290G>A. The report date is 11/09/2019.  The Breast Cancer STAT panel offered by [facility] includes sequencing and rearrangement analysis for the following 9 genes:  ATM, BRCA1, BRCA2, CDH1, CHEK2, PALB2, PTEN, STK11 and TP53.  The Common Hereditary Cancers panel offered by [facility] includes sequencing and/or deletion duplication  testing of the following 48 genes: APC, ATM, AXIN2, BARD1, BMPR1A, BRCA1, BRCA2, BRIP1, CDH1, CDK4, CDKN2A (p14ARF), CDKN2A (p16INK4a), CHEK2, CTNNA1, DICER1, EPCAM (Deletion/duplication testing only), GREM1 (promoter region deletion/duplication testing only), KIT, MEN1, MLH1, MSH2, MSH3, MSH6, MUTYH, NBN, NF1, NHTL1, PALB2, PDGFRA, PMS2, POLD1, POLE, PTEN, RAD50, RAD51C, RAD51D, RNF43, SDHB, SDHC, SDHD, SMAD4, SMARCA4. STK11, TP53, TSC1, TSC2, and VHL.  The following genes were evaluated for sequence changes only: SDHA and HOXB13 c.251G>A variant only.  (2) status post right lumpectomy and sentinel lymph node sampling 11/04/2019 for ductal carcinoma in situ, 1.1 cm, grade 2, with negative margins.  (a) 3 right axilla sentinel lymph nodes were clear  (3) adjuvant radiation 12/07/2019 through 01/22/2020  (4) to start anastrozole 02/15/2020  (a) bone density   PLAN: Aundria did very well with her surgery and is completing her radiation without any untoward events.  I expect she will continue to recover and her skin will normalize over the next several weeks.  She understands her breast cancer was not life-threatening and that her risk of this breast cancer recurring in the same breast is very low is, certainly less than 46%.    On the other hand she has a somewhat higher than normal risk of developing another breast cancer in either breast in the future.  She can cut that risk in half by taking antiestrogens for 5 years.  We discussed the difference between tamoxifen and anastrozole in detail. She understands that anastrozole and the aromatase inhibitors in general work by blocking estrogen production. Accordingly vaginal dryness, decrease in bone density, and of course hot flashes can result. The aromatase inhibitors can also negatively affect the cholesterol profile, although that is a minor effect. One out of 5 women on aromatase inhibitors we will feel "old and achy". This arthralgia/myalgia  syndrome, which resembles fibromyalgia clinically, does resolve with stopping the medications. Accordingly this is not a reason to not try an aromatase inhibitor but it is a frequent reason to stop it (in other words 20% of women will not be able to tolerate these medications).  Tamoxifen on the other hand does not block estrogen production. It does not "take away a woman's estrogen". It blocks the estrogen receptor in breast cells. Like anastrozole, it can also cause hot flashes. As opposed to anastrozole, tamoxifen has many estrogen-like effects. It is technically an estrogen receptor modulator. This means that in some tissues tamoxifen works like estrogen-- for example it helps strengthen the bones. It tends to improve the cholesterol profile. It can cause thickening of the endometrial lining, and even endometrial polyps or rarely cancer of the uterus.(The risk of uterine cancer due to tamoxifen is one additional cancer per thousand women year). It can cause vaginal wetness or stickiness. It can cause blood clots through this estrogen-like effect--the risk of blood clots with tamoxifen is exactly the same as with birth control pills or hormone replacement.  Neither of these agents causes mood changes or weight gain, despite the popular belief that they can have these side effects. We have data from studies comparing either of these drugs with placebo, and in those cases the control group had the same amount of weight gain and depression as the group that took the drug.  I think she would be a good candidate for anastrozole.  I have put in the prescription for her.  She will started on March 1.  Assuming all  goes well she will then have her mammography in August and we will add a bone density at the same time.  She will see me shortly after that and then yearly for the next 5 years  She has a good understanding of this plan and is in agreement with it.  Total encounter time 30 minutes.Chauncey Cruel, MD   01/21/2020 12:02 PM Medical Oncology and Hematology [facility] [place], [place] [address] Tel. [phone]    Fax. [phone]   This document serves as a record of services personally performed by Lurline Del, MD. It was created on his behalf by Wilburn Mylar, a trained medical scribe. The creation of this record is based on the scribe's personal observations and the provider's statements to them.   I, Lurline Del MD, have reviewed the above documentation for accuracy and completeness, and I agree with the above.

## 2020-01-21 ENCOUNTER — Other Ambulatory Visit: Payer: Self-pay

## 2020-01-21 ENCOUNTER — Encounter: Payer: Self-pay | Admitting: *Deleted

## 2020-01-21 ENCOUNTER — Ambulatory Visit
Admission: RE | Admit: 2020-01-21 | Discharge: 2020-01-21 | Disposition: A | Discharge status: Home / Self Care | Payer: Medicare Other | Source: Ambulatory Visit | Attending: Radiation Oncology | Admitting: Radiation Oncology

## 2020-01-21 ENCOUNTER — Ambulatory Visit: Payer: Medicare Other

## 2020-01-21 ENCOUNTER — Inpatient Hospital Stay: Payer: Medicare Other | Attending: Oncology | Admitting: Oncology

## 2020-01-21 VITALS — BP 114/70 | HR 102 | Temp 98.0°F | Resp 18 | Ht 60.0 in | Wt 194.4 lb

## 2020-01-21 DIAGNOSIS — I10 Essential (primary) hypertension: Secondary | ICD-10-CM | POA: Diagnosis not present

## 2020-01-21 DIAGNOSIS — M797 Fibromyalgia: Secondary | ICD-10-CM | POA: Insufficient documentation

## 2020-01-21 DIAGNOSIS — Z79899 Other long term (current) drug therapy: Secondary | ICD-10-CM | POA: Diagnosis not present

## 2020-01-21 DIAGNOSIS — Z170 Estrogen receptor positive status [ER+]: Secondary | ICD-10-CM | POA: Insufficient documentation

## 2020-01-21 DIAGNOSIS — F39 Unspecified mood [affective] disorder: Secondary | ICD-10-CM | POA: Diagnosis not present

## 2020-01-21 DIAGNOSIS — M79601 Pain in right arm: Secondary | ICD-10-CM

## 2020-01-21 DIAGNOSIS — E119 Type 2 diabetes mellitus without complications: Secondary | ICD-10-CM

## 2020-01-21 DIAGNOSIS — M25611 Stiffness of right shoulder, not elsewhere classified: Secondary | ICD-10-CM

## 2020-01-21 DIAGNOSIS — D0511 Intraductal carcinoma in situ of right breast: Secondary | ICD-10-CM | POA: Diagnosis not present

## 2020-01-21 DIAGNOSIS — R293 Abnormal posture: Secondary | ICD-10-CM

## 2020-01-21 DIAGNOSIS — Z510 Encounter for antineoplastic radiation therapy: Secondary | ICD-10-CM | POA: Diagnosis not present

## 2020-01-21 DIAGNOSIS — I158 Other secondary hypertension: Secondary | ICD-10-CM

## 2020-01-21 DIAGNOSIS — E1142 Type 2 diabetes mellitus with diabetic polyneuropathy: Secondary | ICD-10-CM | POA: Diagnosis not present

## 2020-01-21 DIAGNOSIS — Z923 Personal history of irradiation: Secondary | ICD-10-CM | POA: Diagnosis not present

## 2020-01-21 MED ORDER — ANASTROZOLE 1 MG PO TABS: 1.0000 mg | ORAL_TABLET | Freq: Every day | ORAL | 4 refills | Status: DC

## 2020-01-21 NOTE — Therapy (Signed)
[place], [place], [address] Phone: [phone]   Fax:  [phone]  Physical Therapy Treatment  Patient Details  Name: Christina Webster MRN: [id number] Date of Birth: 21-Mar-1961 Referring Provider (PT): Dr. [name]   Encounter Date: 01/21/2020  PT End of Session - 01/21/20 1104    Visit Number  8    Number of Visits  10    Date for PT Re-Evaluation  01/28/20    PT Start Time  1007    PT Stop Time  1105    PT Time Calculation (min)  58 min    Activity Tolerance  Patient tolerated treatment well    Behavior During Therapy  [facility] for tasks assessed/performed       Past Medical History:  Diagnosis Date  . Anemia   . Anxiety and depression   . Cancer [facility])    right breast can - recent dx  . Chronic back pain   . Diabetic peripheral neuropathy ([place])   . Family history of breast cancer   . Family history of stomach cancer   . Fibromyalgia   . Hypercholesteremia   . Hypertension   . Migraines    occasional   . Type II diabetes mellitus ([place])     Past Surgical History:  Procedure Laterality Date  . BREAST LUMPECTOMY WITH RADIOACTIVE SEED AND SENTINEL LYMPH NODE BIOPSY Right 11/04/2019   Procedure: RIGHT BREAST LUMPECTOMY WITH RADIOACTIVE SEED AND SENTINEL LYMPH NODE MAPPING;  Surgeon: [name], MD;  Location: [facility];  Service: General;  Laterality: Right;  . COLONOSCOPY    . TUBAL LIGATION  1988    There were no vitals filed for this visit.  Subjective Assessment - 01/21/20 1008    Subjective  Doing good. Ready to make today my last visit.    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 Patient reports she underwent a right lumpectomy and sentinel node biopsy (3 negative nodes) on 11/04/2019. It is ER positive and PR negative.    Patient Stated Goals  See if my arm can get better    Currently in Pain?  No/denies          [facility] PT Assessment - 01/21/20 0001      AROM   Right Shoulder Flexion  137 Degrees    Right Shoulder ABduction  115 Degrees           Quick Dash - 01/21/20 0001    Open a tight or new jar  Mild difficulty    Do heavy household chores (wash walls, wash floors)  No difficulty    Carry a shopping bag or briefcase  No difficulty    Wash your back  Mild difficulty    Use a knife to cut food  No difficulty    Recreational activities in which you take some force or impact through your arm, shoulder, or hand (golf, hammering, tennis)  Mild difficulty    During the past week, to what extent has your arm, shoulder or hand problem interfered with your normal social activities with family, friends, neighbors, or groups?  Quite a bit    During the past week, to what extent has your arm, shoulder or hand problem limited your work or other regular daily activities  Not at all    Arm, shoulder, or hand pain.  None    Tingling (pins and needles) in your arm, shoulder, or  hand  None    Difficulty Sleeping  No difficulty    DASH Score  13.64 %             [facility] Adult PT Treatment/Exercise - 01/21/20 0001      Shoulder Exercises: Pulleys   Flexion  2 minutes    Flexion Limitations  Pt with good technique after min VCs initially to drop Rt shoulder    ABduction  1 minute    ABduction Limitations  VCs to remind pt to hold stretch for a few sec/slower pace      Shoulder Exercises: Therapy Ball   Flexion  Both;5 reps   forward lean into end of stretch     Manual Therapy   Soft tissue mobilization  With thick massage cream (avoiding radiated tissue) to Rt upper arm especially in area of deltoid to decrease muscle tightness to allow for improved ROM; then at Rt periscapular region during scap mobs    Scapular Mobilization  in left sidelying to Rt scapula in direction of protraction and retraction with and without passive er/IR to faciliatate better scapular movement, also into  abduction to pts tolerance    Passive ROM  gently to Rt shoulder in direction of flexion, horizontal abduction and scaption, pt was limited by pain throughout upper arm especially in deltoid and at medial upper arm with abduction, also included er when in S/L which pt toelrated without any increased discomfort today.                   PT Long Term Goals - 01/21/20 1309      PT LONG TERM GOAL #1   Title  Patient will demonstrate she has regained full shoulder ROM and function post operatively compared to baselines.    Baseline  Pt has regained flexion, is still lacking 17 degrees of abduction from baseline-01/21/20    Status  Partially Met      PT LONG TERM GOAL #2   Title  Patient will increase right shoulder active flexion to >/= 110 degrees for return to baseline and to reach overhead.    Baseline  99; 137 degrees - 01/21/20    Status  Achieved      PT LONG TERM GOAL #3   Title  Patient will increase right shoulder active abduction to >/= 130 degrees for return to baseline and to reach overhead.    Baseline  78; 115 degrees - 01/21/20    Status  Partially Met      PT LONG TERM GOAL #4   Title  Patient will improve DASH score to be </= 30 for improved overall shoudler function.    Baseline  43.18; 13.64 - 01/21/20    Status  Achieved      PT LONG TERM GOAL #5   Title  Patient will verbalize good understanding of lymphedema risk reduction practices.    Baseline  Pt ended up not coming to ABC class, but is aware of signs of infection to look for-01/21/20    Status  Partially Met            Plan - 01/21/20 1104    Clinical Impression Statement  Pt has done excellent this round of therapy as is evidenced by her A/ROM well improving meeting flexion goal. She did not meet abduction goal but improved from 78 to 115 degrees, and pt reports being much more functional now with ADLs and not feeling limited by pain. She has also met her Quick  DASH goal. Pt reports feeling much improved  and feels ready to D/C to HEP at this time.    Stability/Clinical Decision Making  Stable/Uncomplicated    Rehab Potential  Good    PT Frequency  2x / week    PT Duration  4 weeks    PT Treatment/Interventions  ADLs/Self Care Home Management;Therapeutic exercise;Patient/family education;Manual techniques;Scar mobilization;Passive range of motion    PT Next Visit Plan  D/C this session.    PT Home Exercise Plan  Post op shoulder ROM HEP; supine scapular series and forearms on wall for scapular stability    Consulted and Agree with Plan of Care  Patient       Patient will benefit from skilled therapeutic intervention in order to improve the following deficits and impairments:  Postural dysfunction, Decreased range of motion, Decreased knowledge of precautions, Pain, Impaired UE functional use, Decreased scar mobility  Visit Diagnosis: Ductal carcinoma in situ (DCIS) of right breast  Abnormal posture  Stiffness of right shoulder, not elsewhere classified  Pain in right arm     Problem List Patient Active Problem List   Diagnosis Date Noted  . Genetic testing 10/27/2019  . Mood disorder ([place]) 10/21/2019  . Morbid obesity ([place]) 10/21/2019  . Family history of breast cancer   . Family history of stomach cancer   . 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 Chronic migraine 11/14/2016  . Sleep apnea 11/14/2016  . Hyperglycemia 12/07/2013  . Diabetes mellitus without ophthalmic manifestations ([place]) 12/07/2013  . HTN (hypertension) 12/07/2013  . Fibromyalgia 12/07/2013  . UTI (urinary tract infection) 12/07/2013    Otelia Limes, PTA 01/21/2020, 1:18 PM  [place], [place], [address] Phone: [phone]   Fax:  [phone]  Name: Christina Webster MRN: [id number] Date of Birth: 1961-02-14

## 2020-01-22 ENCOUNTER — Telehealth: Payer: Self-pay | Admitting: Oncology

## 2020-01-22 ENCOUNTER — Encounter: Payer: Self-pay | Admitting: Radiation Oncology

## 2020-01-22 ENCOUNTER — Ambulatory Visit
Admission: RE | Admit: 2020-01-22 | Discharge: 2020-01-22 | Disposition: A | Discharge status: Home / Self Care | Payer: Medicare Other | Source: Ambulatory Visit | Attending: Radiation Oncology | Admitting: Radiation Oncology

## 2020-01-22 ENCOUNTER — Ambulatory Visit: Payer: Medicare Other

## 2020-01-22 ENCOUNTER — Other Ambulatory Visit: Payer: Self-pay

## 2020-01-22 DIAGNOSIS — Z510 Encounter for antineoplastic radiation therapy: Secondary | ICD-10-CM | POA: Diagnosis not present

## 2020-01-22 NOTE — Telephone Encounter (Signed)
I talk with patient regarding schedule  

## 2020-01-25 ENCOUNTER — Ambulatory Visit: Payer: Medicare Other

## 2020-02-10 ENCOUNTER — Telehealth: Payer: Self-pay | Admitting: Radiation Oncology

## 2020-02-10 NOTE — Telephone Encounter (Signed)
  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  Interval Since Last Radiation:  4 weeks   12/07/2019-01/22/20:  The right breast was treated to 50.4 Gy in 28 fractions followed by a 10 Gy boost in 5 fractions to the surgical cavity.   Narrative:  The patient was contacted today for routine follow-up. During treatment she did very well with radiotherapy and did not have significant desquamation.   Impression/Plan: 1.  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 The patient was unable to take my call so I left a voicemail and discussed that we would be happy to continue to follow her as needed, but she will also continue to follow up with Dr. Jana Hakim in medical oncology. She was counseled on skin care as well as measures to avoid sun exposure to this area.  2. Survivorship. We discussed the importance of survivorship evaluation and encouraged her to attend her appointment once she's been scheduled by medical oncology.    Carola Rhine, PAC

## 2020-02-13 NOTE — Progress Notes (Signed)
  Radiation Oncology         [phone]) [phone] ________________________________  Name: Christina Webster MRN: [id number]  Date: 01/22/2020  DOB: 09/11/1961  End of Treatment Note  Diagnosis:   right-sided breast cancer     Indication for treatment:  Curative       Radiation treatment dates:   12/07/19 - 01/22/20  Site/dose:   The patient initially received a dose of 50.4 Gy in 28 fractions to the breast using whole-breast tangent fields. This was delivered using a 3-D conformal technique. The patient then received a boost to the seroma. This delivered an additional 10 Gy in 5 fractions using a 3-field photon boost technique. The total dose was 60.4 Gy.  Narrative: The patient tolerated radiation treatment relatively well.   The patient had some expected skin irritation as she progressed during treatment. Moist desquamation was not present at the end of treatment.  Plan: The patient has completed radiation treatment. The patient will return to radiation oncology clinic for routine followup in one month. I advised the patient to call or return sooner if they have any questions or concerns related to their recovery or treatment. ________________________________  Jodelle Gross, M.D., Ph.D.

## 2020-03-11 ENCOUNTER — Ambulatory Visit: Payer: Medicare Other | Attending: Internal Medicine

## 2020-03-11 DIAGNOSIS — Z23 Encounter for immunization: Secondary | ICD-10-CM

## 2020-03-11 NOTE — Progress Notes (Signed)
   Covid-19 Vaccination Clinic  Name:  Christina Webster    MRN: [id number] DOB: Dec 27, 1960  03/11/2020  Ms. Christina Webster was observed post Covid-19 immunization for 15 minutes without incident. She was provided with Vaccine Information Sheet and instruction to access the V-Safe system.   Ms. Christina Webster was instructed to call 911 with any severe reactions post vaccine: Marland Kitchen Difficulty breathing  . Swelling of face and throat  . A fast heartbeat  . A bad rash all over body  . Dizziness and weakness   Immunizations Administered    Name Date Dose VIS Date Route   Pfizer COVID-19 Vaccine 03/11/2020  8:57 AM 0.3 mL 11/27/2019 Intramuscular   Manufacturer: [place]   Lot: [id number]   [place]: [id number]

## 2020-04-04 ENCOUNTER — Ambulatory Visit: Payer: Medicare Other | Attending: Internal Medicine

## 2020-04-04 DIAGNOSIS — Z23 Encounter for immunization: Secondary | ICD-10-CM

## 2020-04-04 NOTE — Progress Notes (Signed)
   Covid-19 Vaccination Clinic  Name:  Christina Webster    MRN: [id number] DOB: Feb 27, 1961  04/04/2020  Ms. Christina Webster was observed post Covid-19 immunization for 15 minutes without incident. She was provided with Vaccine Information Sheet and instruction to access the V-Safe system.   Ms. Christina Webster was instructed to call 911 with any severe reactions post vaccine: Marland Kitchen Difficulty breathing  . Swelling of face and throat  . A fast heartbeat  . A bad rash all over body  . Dizziness and weakness   Immunizations Administered    Name Date Dose VIS Date Route   Pfizer COVID-19 Vaccine 04/04/2020  2:03 PM 0.3 mL 02/10/2019 Intramuscular   Manufacturer: [place]   Lot: [id number]   [place]: [id number]

## 2020-08-11 ENCOUNTER — Telehealth: Payer: Self-pay | Admitting: Oncology

## 2020-08-11 NOTE — Telephone Encounter (Signed)
Called pt per 8/26 sch msg - no answer.left message for patient to call back to reschedule appt.

## 2020-08-15 ENCOUNTER — Inpatient Hospital Stay: Payer: Medicare Other | Admitting: Oncology

## 2020-08-30 ENCOUNTER — Telehealth: Payer: Self-pay | Admitting: Adult Health

## 2020-08-30 NOTE — Telephone Encounter (Signed)
Called pt per 9/13 sch msg - left message for patient to call back to reschedule.

## 2020-09-09 ENCOUNTER — Inpatient Hospital Stay: Payer: Medicare Other | Admitting: Adult Health

## 2020-10-14 ENCOUNTER — Inpatient Hospital Stay: Payer: Medicare Other | Attending: Oncology | Admitting: Adult Health

## 2020-10-14 ENCOUNTER — Other Ambulatory Visit: Payer: Self-pay

## 2020-10-14 ENCOUNTER — Encounter: Payer: Self-pay | Admitting: Adult Health

## 2020-10-14 ENCOUNTER — Telehealth: Payer: Self-pay | Admitting: Adult Health

## 2020-10-14 VITALS — BP 120/75 | HR 99 | Temp 98.4°F | Resp 18 | Ht 60.0 in | Wt 186.9 lb

## 2020-10-14 DIAGNOSIS — Z794 Long term (current) use of insulin: Secondary | ICD-10-CM | POA: Diagnosis not present

## 2020-10-14 DIAGNOSIS — D0511 Intraductal carcinoma in situ of right breast: Secondary | ICD-10-CM | POA: Diagnosis not present

## 2020-10-14 DIAGNOSIS — Z79811 Long term (current) use of aromatase inhibitors: Secondary | ICD-10-CM | POA: Diagnosis not present

## 2020-10-14 DIAGNOSIS — Z170 Estrogen receptor positive status [ER+]: Secondary | ICD-10-CM | POA: Diagnosis not present

## 2020-10-14 DIAGNOSIS — Z79899 Other long term (current) drug therapy: Secondary | ICD-10-CM | POA: Diagnosis not present

## 2020-10-14 DIAGNOSIS — M797 Fibromyalgia: Secondary | ICD-10-CM | POA: Insufficient documentation

## 2020-10-14 NOTE — Progress Notes (Signed)
[place]  [phone] [phone]) [phone]     ID: Christina Webster DOB: [birth year]  MR#: [id number]  CSN#:[id number]  Patient Care Team: Simona Huh, NP as PCP - General (Nurse Practitioner) Mauro Kaufmann, RN as Oncology Nurse Navigator Rockwell Germany, RN as Oncology Nurse Navigator Mansouraty, [name], MD as Consulting Physician (Gastroenterology) Magrinat, Virgie Dad, MD as Consulting Physician (Oncology) Kyung Rudd, MD as Consulting Physician (Radiation Oncology) [name], MD as Consulting Physician (General Surgery) Scot Dock, NP OTHER MD:  CHIEF COMPLAINT: Ductal carcinoma in situ  CURRENT TREATMENT: Anastrozole daily   INTERVAL HISTORY: Christina "Christina Webster" returns today for follow up of her noninvasive breast cancer.  She is taking Anastrozole and tolerating it well.  She denies any new arthralgias, hot flashes, vaginal dryness.  She is not exercising.    Christina Webster sees her PCP regularly.    REVIEW OF SYSTEMS: Christina Webster notes she is doing quite well.  She has not yet undergone her repeat mammogram and bone density testing.  She is feeling well and a detailed ROS was otherwise non contributory.     HISTORY OF CURRENT ILLNESS:    From the original intake note:  Christina Webster" had routine screening mammography on 09/26/2019 showing a possible abnormality in the right breast. She underwent right diagnostic mammography with tomography at [facility] on 10/01/2019 showing: breast density category C; 1.4 cm linear calcifications in the right breast, upper-outer quadrant.  Accordingly on 10/14/2019 she proceeded to biopsy of the right breast area in question. 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 Prognostic indicators significant for: estrogen receptor, 100% positive with strong staining intensity and progesterone receptor, 0% negative.   The patient's  subsequent history is as detailed below.   PAST MEDICAL HISTORY: Past Medical History:  Diagnosis Date  . Anemia   . Anxiety and depression   . Cancer [facility])    right breast can - recent dx  . Chronic back pain   . Diabetic peripheral neuropathy ([place])   . Family history of breast cancer   . Family history of stomach cancer   . Fibromyalgia   . Hypercholesteremia   . Hypertension   . Migraines    occasional   . Type II diabetes mellitus ([place])     PAST SURGICAL HISTORY: Past Surgical History:  Procedure Laterality Date  . BREAST LUMPECTOMY WITH RADIOACTIVE SEED AND SENTINEL LYMPH NODE BIOPSY Right 11/04/2019   Procedure: RIGHT BREAST LUMPECTOMY WITH RADIOACTIVE SEED AND SENTINEL LYMPH NODE MAPPING;  Surgeon: [name], MD;  Location: [facility];  Service: General;  Laterality: Right;  . COLONOSCOPY    . TUBAL LIGATION  1988    FAMILY HISTORY: Family History  Problem Relation Age of Onset  . Hypertension Other   . Diabetes Other   . Cancer Other   . Hyperlipidemia Other   . Sleep apnea Other   . Breast cancer Mother 72  . Diabetes Mother   . Hypertension Mother   . Other Father        Unsure of medical history.  . Breast cancer Maternal Aunt 60  . Breast cancer Cousin 14       maternal cousin  . Healthy Half-Sister   . Healthy Half-Brother   . Healthy Half-Sister   . Stomach cancer Maternal Aunt 64   The patient has no information regarding her father or his side of the family.  Patient's mother is 73 years old as of November 2020.  The patient has 1 brother, 2 sisters.  The patient's mother had breast cancer at age 22.  There is also a maternal aunt and 2 maternal cousins with breast cancer.   GYNECOLOGIC HISTORY:  Patient's last menstrual period was 11/24/2013 (lmp unknown). Menarche: 59 years old Age at first live birth: 59 years old [place] P 3 LMP 2014 HRT no  Hysterectomy? no BSO? no   SOCIAL HISTORY: (updated 10/2019)  Christina "Christina Webster" is disabled  secondary to fibromyalgia. She is single. She lives at home with her daughters Christina Webster and Christina Webster and 4 grandchildren.  Daughter Christina Webster lives in [place] and currently is unemployed but has worked at a call center.  Daughter Christina Webster works as a Radiation protection practitioner and daughter Christina Webster also work lives in [place] and works as a Freight forwarder from home.  The patient is not a church attender    ADVANCED DIRECTIVES: Not in place but she intends to name her daughter Christina Webster as healthcare power of attorney.  Christina Webster can be reached at [phone]   HEALTH MAINTENANCE: Social History   Tobacco Use  . Smoking status: Never Smoker  . Smokeless tobacco: Never Used  Vaping Use  . Vaping Use: Never used  Substance Use Topics  . Alcohol use: Yes    Comment: -occasional  . Drug use: No     Colonoscopy: 08/2018 (Dr. Rush Landmark), repeat 2029  PAP: none on file  Bone density: none on file   No Known Allergies  Current Outpatient Medications  Medication Sig Dispense Refill  . anastrozole (ARIMIDEX) 1 MG tablet Take 1 tablet (1 mg total) by mouth daily. 90 tablet 4  . aspirin EC 81 MG tablet Take 81 mg by mouth daily.    Marland Kitchen atorvastatin (LIPITOR) 10 MG tablet Take 10 mg by mouth at bedtime.    . cyclobenzaprine (FLEXERIL) 10 MG tablet Take 1 tablet (10 mg total) by mouth 2 (two) times daily as needed for up to 29 doses for muscle spasms. 20 tablet 0  . diclofenac sodium (VOLTAREN) 1 % GEL Apply 2 g topically daily as needed (Body).    . Insulin Lispro Prot & Lispro (HUMALOG MIX 75/25 KWIKPEN) (75-25) 100 UNIT/ML Kwikpen Inject 35-50 Units into the skin See admin instructions. 50 units in the morning and 35 units in the eveing    . losartan-hydrochlorothiazide (HYZAAR) 50-12.5 MG tablet Take 1 tablet by mouth daily.   3  . Magnesium 200 MG TABS Take 400 mg by mouth at bedtime.     . SUMAtriptan (IMITREX) 25 MG tablet Take 25 mg by mouth every 2 (two) hours as needed for migraine. May repeat in 2 hours if headache  persists or recurs.    Marland Kitchen SYNJARDY XR 12.04-999 MG TB24 Take 2 tablets by mouth daily.     . traMADol (ULTRAM) 50 MG tablet Take 50 mg by mouth 5 (five) times daily as needed for moderate pain.     Marland Kitchen VASCEPA 0.5 g CAPS SMARTSIG:2 Capsule(s) By Mouth As Needed     No current facility-administered medications for this visit.    OBJECTIVE:   Vitals:   10/14/20 1415  BP: 120/75  Pulse: 99  Resp: 18  Temp: 98.4 F (36.9 C)  SpO2: 97%     Body mass index is 36.5 kg/m.   Wt Readings from Last 3 Encounters:  10/14/20 186 lb 14.4 oz (84.8 kg)  01/21/20 194 lb 6.4 oz (88.2 kg)  11/25/19  199 lb (90.3 kg)      ECOG FS:1 - Symptomatic but completely ambulatory  GENERAL: Patient is a well appearing female in no acute distress HEENT:  Sclerae anicteric. Mask in place. Neck is supple.  NODES:  No cervical, supraclavicular, or axillary lymphadenopathy palpated.  BREAST EXAM:  Right breast s/p lumpectomy and radiation, no sign of local recurrence, left breast benign LUNGS:  Clear to auscultation bilaterally.  No wheezes or rhonchi. HEART:  Regular rate and rhythm. No murmur appreciated. ABDOMEN:  Soft, nontender.  Positive, normoactive bowel sounds. No organomegaly palpated. MSK:  No focal spinal tenderness to palpation. FROM in left arm, limited ROM in right arm. EXTREMITIES:  No peripheral edema.   SKIN:  Clear with no obvious rashes or skin changes. No nail dyscrasia. NEURO:  Nonfocal. Well oriented.  Appropriate affect.     LAB RESULTS:  CMP     Component Value Date/Time   NA 137 10/30/2019 0853   K 3.6 10/30/2019 0853   CL 100 10/30/2019 0853   CO2 25 10/30/2019 0853   GLUCOSE 130 (H) 10/30/2019 0853   BUN 16 10/30/2019 0853   CREATININE 1.15 (H) 10/30/2019 0853   CREATININE 1.37 (H) 10/21/2019 1232   CALCIUM 9.8 10/30/2019 0853   PROT 8.5 (H) 10/21/2019 1232   ALBUMIN 4.0 10/21/2019 1232   AST 14 (L) 10/21/2019 1232   ALT 25 10/21/2019 1232   ALKPHOS 86 10/21/2019 1232    BILITOT <0.2 (L) 10/21/2019 1232   GFRNONAA 53 (L) 10/30/2019 0853   GFRNONAA 43 (L) 10/21/2019 1232   GFRAA >60 10/30/2019 0853   GFRAA 49 (L) 10/21/2019 1232    No results found for: TOTALPROTELP, ALBUMINELP, A1GS, A2GS, BETS, BETA2SER, GAMS, MSPIKE, SPEI  No results found for: KPAFRELGTCHN, LAMBDASER, KAPLAMBRATIO  Lab Results  Component Value Date   WBC 15.7 (H) 10/30/2019   NEUTROABS 15.0 (H) 10/21/2019   HGB 12.2 10/30/2019   HCT 40.2 10/30/2019   MCV 83.4 10/30/2019   PLT 609 (H) 10/30/2019    _0 @  No results found for: LABCA2  No components found for: EYCXKG818  No results for input(s): INR in the last 168 hours.  No results found for: LABCA2  No results found for: HUD149  No results found for: FWY637  No results found for: CHY850  No results found for: CA2729  No components found for: HGQUANT  No results found for: CEA1 / No results found for: CEA1   No results found for: AFPTUMOR  No results found for: CHROMOGRNA  No results found for: HGBA, HGBA2QUANT, HGBFQUANT, HGBSQUAN (Hemoglobinopathy evaluation)   No results found for: LDH  No results found for: IRON, TIBC, IRONPCTSAT (Iron and TIBC)  No results found for: FERRITIN  Urinalysis    Component Value Date/Time   COLORURINE YELLOW 12/07/2013 1234   APPEARANCEUR TURBID (A) 12/07/2013 1234   LABSPEC 1.035 (H) 12/07/2013 1234   PHURINE 5.5 12/07/2013 1234   GLUCOSEU >1000 (A) 12/07/2013 1234   HGBUR MODERATE (A) 12/07/2013 1234   BILIRUBINUR NEGATIVE 12/07/2013 1234   KETONESUR NEGATIVE 12/07/2013 1234   PROTEINUR 30 (A) 12/07/2013 1234   UROBILINOGEN 0.2 12/07/2013 1234   NITRITE NEGATIVE 12/07/2013 1234   LEUKOCYTESUR LARGE (A) 12/07/2013 1234    STUDIES: No results found.  ELIGIBLE FOR AVAILABLE RESEARCH PROTOCOL: Not a comment candidate  ASSESSMENT: 59 y.o. [place] woman status post right breast biopsy 10/14/2023 ductal carcinoma in situ, grade 2 or 3,  measuring 1.4 cm, estrogen and progesterone receptor  positive  (1) genetics testing  (a) Negative genetic testing:  No pathogenic variants detected on the [facility] Breast Cancer STAT panel. The report date is 10/27/2019. No pathogenic variants detected on the [facility] Common Hereditary Cancers panel. Two variants of uncertain significance were detected: one in the MSH3 gene called c.1502G>A, and one in the POLD1 gene called c.3290G>A. The report date is 11/09/2019.  The Breast Cancer STAT panel offered by [facility] includes sequencing and rearrangement analysis for the following 9 genes:  ATM, BRCA1, BRCA2, CDH1, CHEK2, PALB2, PTEN, STK11 and TP53.  The Common Hereditary Cancers panel offered by [facility] includes sequencing and/or deletion duplication testing of the following 48 genes: APC, ATM, AXIN2, BARD1, BMPR1A, BRCA1, BRCA2, BRIP1, CDH1, CDK4, CDKN2A (p14ARF), CDKN2A (p16INK4a), CHEK2, CTNNA1, DICER1, EPCAM (Deletion/duplication testing only), GREM1 (promoter region deletion/duplication testing only), KIT, MEN1, MLH1, MSH2, MSH3, MSH6, MUTYH, NBN, NF1, NHTL1, PALB2, PDGFRA, PMS2, POLD1, POLE, PTEN, RAD50, RAD51C, RAD51D, RNF43, SDHB, SDHC, SDHD, SMAD4, SMARCA4. STK11, TP53, TSC1, TSC2, and VHL.  The following genes were evaluated for sequence changes only: SDHA and HOXB13 c.251G>A variant only.  (2) status post right lumpectomy and sentinel lymph node sampling 11/04/2019 for ductal carcinoma in situ, 1.1 cm, grade 2, with negative margins.  (a) 3 right axilla sentinel lymph nodes were clear  (3) adjuvant radiation 12/07/2019 through 01/22/2020  (4) to start anastrozole 02/15/2020  (a) bone density   PLAN: Christina Webster is doing well.  She has no clinical or radiographic sign of breast cancer recurrence.  She continues on Anastrozole with good tolerance.  I recommended that she go ahead and get her mammogram and bone density scheduled and I will ask my nurse to ensure that [facility] has these  orders.  [place] was recommended healthy diet and exercise.  We discussed her right arm limited ROM and I recommended she increase her exercises to perform them daily.  She does not want a referral back to PT due to the financial constraint of the copays.    We will see her back in 6 months for labs and f/u.  She knows to call for any questions that may arise between now and her next appointment.  We are Christina to see her sooner if needed.   Total encounter time 20 minutes.Wilber Bihari, NP 10/14/20 2:26 PM Medical Oncology and Hematology [facility] [place], [place] [address] Tel. [phone]    Fax. [phone]  *Total Encounter Time as defined by the Centers for Medicare and Medicaid Services includes, in addition to the face-to-face time of a patient visit (documented in the note above) non-face-to-face time: obtaining and reviewing outside history, ordering and reviewing medications, tests or procedures, care coordination (communications with other health care professionals or caregivers) and documentation in the medical record.

## 2020-10-14 NOTE — Telephone Encounter (Signed)
Scheduled per 10/29 los. Printed avs and calendar for pt.

## 2020-10-14 NOTE — Patient Instructions (Signed)

## 2020-12-03 ENCOUNTER — Ambulatory Visit: Payer: Medicare Other | Attending: Internal Medicine

## 2020-12-03 DIAGNOSIS — Z23 Encounter for immunization: Secondary | ICD-10-CM

## 2020-12-03 NOTE — Progress Notes (Signed)
   Covid-19 Vaccination Clinic  Name:  Christina Webster    MRN: [id number] DOB: [birth year]  12/03/2020  [name] was observed post Covid-19 immunization for 15 minutes without incident. She was provided with Vaccine Information Sheet and instruction to access the V-Safe system.   [name] was instructed to call 911 with any severe reactions post vaccine: Marland Kitchen Difficulty breathing  . Swelling of face and throat  . A fast heartbeat  . A bad rash all over body  . Dizziness and weakness   Immunizations Administered    Name Date Dose VIS Date Route   Pfizer COVID-19 Vaccine 12/03/2020  9:52 AM 0.3 mL 10/05/2020 Intramuscular   Manufacturer: [place]   Lot: [id number]   [place]: [id number]

## 2021-04-05 ENCOUNTER — Other Ambulatory Visit: Payer: Self-pay | Admitting: Oncology

## 2021-04-11 ENCOUNTER — Telehealth: Payer: Self-pay | Admitting: Oncology

## 2021-04-11 NOTE — Telephone Encounter (Signed)
R/s appts per 4/26 sch msg. Pt aware.  

## 2021-04-13 ENCOUNTER — Other Ambulatory Visit: Payer: Medicare Other

## 2021-04-13 ENCOUNTER — Ambulatory Visit: Payer: Medicare Other | Admitting: Oncology

## 2021-04-13 ENCOUNTER — Ambulatory Visit: Payer: Medicare Other | Admitting: Adult Health

## 2021-06-07 ENCOUNTER — Telehealth: Payer: Self-pay | Admitting: Oncology

## 2021-06-07 NOTE — Telephone Encounter (Signed)
R/s appts per 6/22 sch msg. Pt aware.

## 2021-06-08 ENCOUNTER — Ambulatory Visit: Payer: Medicare Other | Admitting: Oncology

## 2021-06-08 ENCOUNTER — Other Ambulatory Visit: Payer: Medicare Other

## 2021-07-01 ENCOUNTER — Other Ambulatory Visit: Payer: Self-pay | Admitting: Oncology

## 2021-08-03 ENCOUNTER — Other Ambulatory Visit: Payer: Self-pay

## 2021-08-03 ENCOUNTER — Inpatient Hospital Stay: Payer: Medicare Other

## 2021-08-03 ENCOUNTER — Inpatient Hospital Stay: Payer: Medicare Other | Attending: Adult Health | Admitting: Oncology

## 2021-08-03 ENCOUNTER — Telehealth: Payer: Self-pay

## 2021-08-03 VITALS — BP 126/73 | HR 101 | Temp 97.7°F | Resp 18 | Ht 60.0 in | Wt 189.5 lb

## 2021-08-03 DIAGNOSIS — G4730 Sleep apnea, unspecified: Secondary | ICD-10-CM

## 2021-08-03 DIAGNOSIS — D0511 Intraductal carcinoma in situ of right breast: Secondary | ICD-10-CM | POA: Diagnosis not present

## 2021-08-03 DIAGNOSIS — IMO0002 Reserved for concepts with insufficient information to code with codable children: Secondary | ICD-10-CM

## 2021-08-03 DIAGNOSIS — D75839 Thrombocytosis, unspecified: Secondary | ICD-10-CM | POA: Insufficient documentation

## 2021-08-03 DIAGNOSIS — N898 Other specified noninflammatory disorders of vagina: Secondary | ICD-10-CM | POA: Diagnosis not present

## 2021-08-03 DIAGNOSIS — Z923 Personal history of irradiation: Secondary | ICD-10-CM | POA: Insufficient documentation

## 2021-08-03 DIAGNOSIS — D72829 Elevated white blood cell count, unspecified: Secondary | ICD-10-CM | POA: Insufficient documentation

## 2021-08-03 DIAGNOSIS — M797 Fibromyalgia: Secondary | ICD-10-CM

## 2021-08-03 DIAGNOSIS — I10 Essential (primary) hypertension: Secondary | ICD-10-CM

## 2021-08-03 LAB — COMPREHENSIVE METABOLIC PANEL
ALT: 15 U/L (ref 0–44)
AST: 14 U/L — ABNORMAL LOW (ref 15–41)
Albumin: 3.8 g/dL (ref 3.5–5.0)
Alkaline Phosphatase: 96 U/L (ref 38–126)
Anion gap: 13 (ref 5–15)
BUN: 17 mg/dL (ref 6–20)
CO2: 23 mmol/L (ref 22–32)
Calcium: 9.6 mg/dL (ref 8.9–10.3)
Chloride: 104 mmol/L (ref 98–111)
Creatinine, Ser: 1.19 mg/dL — ABNORMAL HIGH (ref 0.44–1.00)
GFR, Estimated: 53 mL/min — ABNORMAL LOW (ref >60)
Glucose, Bld: 145 mg/dL — ABNORMAL HIGH (ref 70–99)
Potassium: 3.7 mmol/L (ref 3.5–5.1)
Sodium: 140 mmol/L (ref 135–145)
Total Bilirubin: 0.2 mg/dL — ABNORMAL LOW (ref 0.3–1.2)
Total Protein: 8.1 g/dL (ref 6.5–8.1)

## 2021-08-03 LAB — CBC WITH DIFFERENTIAL/PLATELET
Abs Immature Granulocytes: 0.06 10*3/uL (ref 0.00–0.07)
Basophils Absolute: 0.1 10*3/uL (ref 0.0–0.1)
Basophils Relative: 0 %
Eosinophils Absolute: 0.7 10*3/uL — ABNORMAL HIGH (ref 0.0–0.5)
Eosinophils Relative: 5 %
HCT: 37.2 % (ref 36.0–46.0)
Hemoglobin: 12.1 g/dL (ref 12.0–15.0)
Immature Granulocytes: 0 %
Lymphocytes Relative: 23 %
Lymphs Abs: 3.2 10*3/uL (ref 0.7–4.0)
MCH: 26.4 pg (ref 26.0–34.0)
MCHC: 32.5 g/dL (ref 30.0–36.0)
MCV: 81 fL (ref 80.0–100.0)
Monocytes Absolute: 0.6 10*3/uL (ref 0.1–1.0)
Monocytes Relative: 5 %
Neutro Abs: 9 10*3/uL — ABNORMAL HIGH (ref 1.7–7.7)
Neutrophils Relative %: 67 %
Platelets: 506 10*3/uL — ABNORMAL HIGH (ref 150–400)
RBC: 4.59 MIL/uL (ref 3.87–5.11)
RDW: 13.8 % (ref 11.5–15.5)
WBC: 13.6 10*3/uL — ABNORMAL HIGH (ref 4.0–10.5)
nRBC: 0 % (ref 0.0–0.2)

## 2021-08-03 NOTE — Telephone Encounter (Signed)
Attempted to call pt to schedule BMBX with Christina Bihari, NP per Dr Jana Hakim. Pt did not answer. Advised pt to call back.

## 2021-08-03 NOTE — Progress Notes (Signed)
[facility]  [phone]) [phone] Fax:(336) [phone]     ID: Pecolia Ades DOB: 25-Jun-1961  MR#: [id number]  CSN#:[id number]  Patient Care Team: Simona Huh, NP as PCP - General (Nurse Practitioner) Mauro Kaufmann, RN as Oncology Nurse Navigator Rockwell Germany, RN as Oncology Nurse Navigator Mansouraty, [name], MD as Consulting Physician (Gastroenterology) Ishmel Acevedo, Virgie Dad, MD as Consulting Physician (Oncology) Kyung Rudd, MD as Consulting Physician (Radiation Oncology) [name], MD as Consulting Physician (General Surgery) Chauncey Cruel, MD OTHER MD:  CHIEF COMPLAINT: Ductal carcinoma in situ  CURRENT TREATMENT: Anastrozole   INTERVAL HISTORY: Christina Webster "Christina Webster" returns today for follow up of her noninvasive breast cancer.    She is taking Anastrozole without any unusual side effects.  She does have significant vaginal dryness issues and she is using a bottle wash to help with that.  We discussed our pelvic rehab program but she declines  She is behind on mammography she says because multiple problems quite aside from the cancer, including some family deaths and illnesses.  She is not however ready to proceed with mammography.   REVIEW OF SYSTEMS: Christina Webster does not exercise regularly.  She feels very stiff.  She feels better when she moves more she says.  A detailed review of systems was otherwise stable   COVID 19 VACCINATION STATUS: [place] x3, most recently 11/2020   HISTORY OF CURRENT ILLNESS:    From the original intake note:  Christina Webster" had routine screening mammography on 09/26/2019 showing a possible abnormality in the right breast. She underwent right diagnostic mammography with tomography at [facility] on 10/01/2019 showing: breast density category C; 1.4 cm linear calcifications in the right breast, upper-outer quadrant.  Accordingly on 10/14/2019 she proceeded to biopsy of the right breast area in question. 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 Prognostic indicators significant for: estrogen receptor, 100% positive with strong staining intensity and progesterone receptor, 0% negative.   The patient's subsequent history is as detailed below.   PAST MEDICAL HISTORY: Past Medical History:  Diagnosis Date   Anemia    Anxiety and depression    Cancer ([place])    right breast can - recent dx   Chronic back pain    Diabetic peripheral neuropathy (HCC)    Family history of breast cancer    Family history of stomach cancer    Fibromyalgia    Hypercholesteremia    Hypertension    Migraines    occasional    Type II diabetes mellitus ([place])     PAST SURGICAL HISTORY: Past Surgical History:  Procedure Laterality Date   BREAST LUMPECTOMY WITH RADIOACTIVE SEED AND SENTINEL LYMPH NODE BIOPSY Right 11/04/2019   Procedure: RIGHT BREAST LUMPECTOMY WITH RADIOACTIVE SEED AND SENTINEL LYMPH NODE MAPPING;  Surgeon: [name], MD;  Location: [facility];  Service: General;  Laterality: Right;   COLONOSCOPY     TUBAL LIGATION  1988    FAMILY HISTORY: Family History  Problem Relation Age of Onset   Hypertension Other    Diabetes Other    Cancer Other    Hyperlipidemia Other    Sleep apnea Other    Breast cancer Mother 6   Diabetes Mother    Hypertension Mother    Other Father        Unsure of medical history.   Breast cancer Maternal Aunt 60   Breast cancer Cousin 31  maternal cousin   Healthy Half-Sister    Healthy Half-Brother    Healthy Half-Sister    Stomach cancer Maternal Aunt 64  The patient has no information regarding her father or his side of the family.  Patient's mother is 1 years old as of November 2020.  The patient has 1 brother, 2 sisters.  The patient's mother had breast cancer at age 63.  There is also a maternal aunt and 2 maternal cousins with breast cancer.   GYNECOLOGIC HISTORY:   Patient's last menstrual period was 11/24/2013 (lmp unknown). Menarche: 60 years old Age at first live birth: 60 years old [place] P 3 LMP 2014 HRT no  Hysterectomy? no BSO? no   SOCIAL HISTORY: (updated 10/2019)  Christina Webster "Christina Webster" is disabled secondary to fibromyalgia. She is single. She lives at home with her daughters Christina Webster and Christina Webster and 4 grandchildren.  Daughter Christina Webster lives in [place] and currently is unemployed but has worked at a call center.  Daughter Christina Webster works as a Radiation protection practitioner and daughter Christina Webster also work lives in [place] and works as a Freight forwarder from home.  The patient is not a church attender    ADVANCED DIRECTIVES: Not in place but she intends to name her daughter Christina Webster as healthcare power of attorney.  Christina Webster can be reached at [phone]   HEALTH MAINTENANCE: Social History   Tobacco Use   Smoking status: Never   Smokeless tobacco: Never  Vaping Use   Vaping Use: Never used  Substance Use Topics   Alcohol use: Yes    Comment: -occasional   Drug use: No     Colonoscopy: 08/2018 (Dr. Rush Landmark), repeat 2029  PAP: none on file  Bone density: none on file   No Known Allergies  Current Outpatient Medications  Medication Sig Dispense Refill   anastrozole (ARIMIDEX) 1 MG tablet TAKE 1 TABLET BY MOUTH EVERY DAY 90 tablet 3   aspirin EC 81 MG tablet Take 81 mg by mouth daily.     atorvastatin (LIPITOR) 10 MG tablet Take 10 mg by mouth at bedtime.     cyclobenzaprine (FLEXERIL) 10 MG tablet Take 1 tablet (10 mg total) by mouth 2 (two) times daily as needed for up to 29 doses for muscle spasms. 20 tablet 0   diclofenac sodium (VOLTAREN) 1 % GEL Apply 2 g topically daily as needed (Body).     Insulin Lispro Prot & Lispro (HUMALOG MIX 75/25 KWIKPEN) (75-25) 100 UNIT/ML Kwikpen Inject 35-50 Units into the skin See admin instructions. 50 units in the morning and 35 units in the eveing     losartan-hydrochlorothiazide (HYZAAR) 50-12.5 MG tablet Take 1 tablet by  mouth daily.   3   Magnesium 200 MG TABS Take 400 mg by mouth at bedtime.      SUMAtriptan (IMITREX) 25 MG tablet Take 25 mg by mouth every 2 (two) hours as needed for migraine. May repeat in 2 hours if headache persists or recurs.     SYNJARDY XR 12.04-999 MG TB24 Take 2 tablets by mouth daily.      traMADol (ULTRAM) 50 MG tablet Take 50 mg by mouth 5 (five) times daily as needed for moderate pain.      VASCEPA 0.5 g CAPS SMARTSIG:2 Capsule(s) By Mouth As Needed     No current facility-administered medications for this visit.    OBJECTIVE: African-American woman who appears older than stated age  35:   08/03/21 1344  BP: 126/73  Pulse: (!) 101  Resp: 18  Temp: 97.7 F (36.5 C)  SpO2: 97%      Body mass index is 37.01 kg/m.   Wt Readings from Last 3 Encounters:  08/03/21 189 lb 8 oz (86 kg)  10/14/20 186 lb 14.4 oz (84.8 kg)  01/21/20 194 lb 6.4 oz (88.2 kg)     ECOG FS:1 - Symptomatic but completely ambulatory  Sclerae unicteric, EOMs intact Wearing a mask No cervical or supraclavicular adenopathy Lungs no rales or rhonchi Heart regular rate and rhythm Abd soft, nontender, positive bowel sounds MSK no focal spinal tenderness, no upper extremity lymphedema Neuro: nonfocal, well oriented, appropriate affect Breasts: The right breast has undergone lumpectomy and radiation.  There is no evidence of local recurrence.  The left breast is benign.  Both axillae are benign   LAB RESULTS:  CMP     Component Value Date/Time   NA 140 08/03/2021 1323   K 3.7 08/03/2021 1323   CL 104 08/03/2021 1323   CO2 23 08/03/2021 1323   GLUCOSE 145 (H) 08/03/2021 1323   BUN 17 08/03/2021 1323   CREATININE 1.19 (H) 08/03/2021 1323   CREATININE 1.37 (H) 10/21/2019 1232   CALCIUM 9.6 08/03/2021 1323   PROT 8.1 08/03/2021 1323   ALBUMIN 3.8 08/03/2021 1323   AST 14 (L) 08/03/2021 1323   AST 14 (L) 10/21/2019 1232   ALT 15 08/03/2021 1323   ALT 25 10/21/2019 1232   ALKPHOS 96  08/03/2021 1323   BILITOT 0.2 (L) 08/03/2021 1323   BILITOT <0.2 (L) 10/21/2019 1232   GFRNONAA 53 (L) 08/03/2021 1323   GFRNONAA 43 (L) 10/21/2019 1232   GFRAA >60 10/30/2019 0853   GFRAA 49 (L) 10/21/2019 1232    No results found for: TOTALPROTELP, ALBUMINELP, A1GS, A2GS, BETS, BETA2SER, GAMS, MSPIKE, SPEI  No results found for: KPAFRELGTCHN, LAMBDASER, KAPLAMBRATIO  Lab Results  Component Value Date   WBC 13.6 (H) 08/03/2021   NEUTROABS 9.0 (H) 08/03/2021   HGB 12.1 08/03/2021   HCT 37.2 08/03/2021   MCV 81.0 08/03/2021   PLT 506 (H) 08/03/2021   No results found for: LABCA2  No components found for: XHBZJI967  No results for input(s): INR in the last 168 hours.  No results found for: LABCA2  No results found for: ELF810  No results found for: FBP102  No results found for: HEN277  No results found for: CA2729  No components found for: HGQUANT  No results found for: CEA1 / No results found for: CEA1   No results found for: AFPTUMOR  No results found for: CHROMOGRNA  No results found for: HGBA, HGBA2QUANT, HGBFQUANT, HGBSQUAN (Hemoglobinopathy evaluation)   No results found for: LDH  No results found for: IRON, TIBC, IRONPCTSAT (Iron and TIBC)  No results found for: FERRITIN  Urinalysis    Component Value Date/Time   COLORURINE YELLOW 12/07/2013 1234   APPEARANCEUR TURBID (A) 12/07/2013 1234   LABSPEC 1.035 (H) 12/07/2013 1234   PHURINE 5.5 12/07/2013 1234   GLUCOSEU >1000 (A) 12/07/2013 1234   HGBUR MODERATE (A) 12/07/2013 1234   BILIRUBINUR NEGATIVE 12/07/2013 1234   KETONESUR NEGATIVE 12/07/2013 1234   PROTEINUR 30 (A) 12/07/2013 1234   UROBILINOGEN 0.2 12/07/2013 1234   NITRITE NEGATIVE 12/07/2013 1234   LEUKOCYTESUR LARGE (A) 12/07/2013 1234    STUDIES: No results found.   ELIGIBLE FOR AVAILABLE RESEARCH PROTOCOL: Not a comment candidate  ASSESSMENT: 60 y.o. [place] woman status post right breast biopsy 10/14/2023 ductal  carcinoma in situ, grade 2 or 3, measuring  1.4 cm, estrogen and progesterone receptor positive  (1) genetics testing 11/09/2019 through the [facility] Breast Cancer STAT panel and Common Hereditary Cancers panel found no deleterious mutations in  ATM, BRCA1, BRCA2, CDH1, CHEK2, PALB2, PTEN, STK11 and TP53, APC, ATM, AXIN2, BARD1, BMPR1A, BRCA1, BRCA2, BRIP1, CDH1, CDK4, CDKN2A (p14ARF), CDKN2A (p16INK4a), CHEK2, CTNNA1, DICER1, EPCAM (Deletion/duplication testing only), GREM1 (promoter region deletion/duplication testing only), KIT, MEN1, MLH1, MSH2, MSH3, MSH6, MUTYH, NBN, NF1, NHTL1, PALB2, PDGFRA, PMS2, POLD1, POLE, PTEN, RAD50, RAD51C, RAD51D, RNF43, SDHB, SDHC, SDHD, SMAD4, SMARCA4. STK11, TP53, TSC1, TSC2, and VHL.  The following genes were evaluated for sequence changes only: SDHA and HOXB13 c.251G>A variant only.  (a) two variants of uncertain significance were detected: one in the MSH3 gene called c.1502G>A, and one in the POLD1 gene called c.3290G>A.   (2) status post right lumpectomy and sentinel lymph node sampling 11/04/2019 for ductal carcinoma in situ, 1.1 cm, grade 2, with negative margins.  (a) 3 right axilla sentinel lymph nodes were clear  (3) adjuvant radiation 12/07/2019 through 01/22/2020 The right breast was treated to 50.4 Gy in 28 fractions followed by a 10 Gy boost in 5 fractions to the surgical cavity.   (4) started anastrozole 02/15/2020  (a) bone density  (5) persistent leukocytosis and thrombocytosis  (a) bone marrow biopsy   PLAN: "Christina Webster" is coming up on 2 years out from her definitive surgery with no evidence of disease recurrence.  This is very favorable.  She is tolerating anastrozole well the plan is to continue that for total of 5 years.  Today we discussed our pelvic rehab program and she decided not to participate but she knows it is available and may wish to reconsider given her problems with vaginal atrophy.  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  We discussed diagnosis of this entity and she is willing to proceed to bone marrow biopsy which is being scheduled  Total encounter time 35 minutes.Sarajane Jews C. Keefe Zawistowski, MD 08/03/21 5:20 PM Medical Oncology and Hematology [facility] [place], [place] [address] Tel. [phone]    Fax. [phone]   I, Wilburn Mylar, am acting as scribe for Dr. Virgie Dad. Saafir Abdullah.  I, Lurline Del MD, have reviewed the above documentation for accuracy and completeness, and I agree with the above.    *Total Encounter Time as defined by the Centers for Medicare and Medicaid Services includes, in addition to the face-to-face time of a patient visit (documented in the note above) non-face-to-face time: obtaining and reviewing outside history, ordering and reviewing medications, tests or procedures, care coordination (communications with other health care professionals or caregivers) and documentation in the medical record.

## 2021-08-04 ENCOUNTER — Telehealth: Payer: Self-pay | Admitting: Oncology

## 2021-08-04 NOTE — Telephone Encounter (Signed)
Scheduled appointment per 08/18 los. Patient is aware.

## 2021-08-10 ENCOUNTER — Telehealth: Payer: Self-pay | Admitting: *Deleted

## 2021-08-10 NOTE — Telephone Encounter (Signed)
Per MD request pt scheduled for BMBX on 08/17/2021 with [facility]/NP  Bed availability obtained Flow scheduled LOS with lab post procedure requested with above information.  Pt contacted with verbalized understanding of appt date, location and explanation of procedure.  Questions answered.  Note pt stated request for change in her last name to reflect as on her insurance card from [place] to [place].  This RN updated per pt's request.

## 2021-08-17 ENCOUNTER — Inpatient Hospital Stay: Payer: Medicare Other | Attending: Oncology | Admitting: Adult Health

## 2021-08-17 ENCOUNTER — Encounter: Payer: Self-pay | Admitting: Adult Health

## 2021-08-17 ENCOUNTER — Other Ambulatory Visit: Payer: Self-pay

## 2021-08-17 ENCOUNTER — Encounter: Payer: Self-pay | Admitting: Oncology

## 2021-08-17 ENCOUNTER — Inpatient Hospital Stay: Payer: Medicare Other

## 2021-08-17 VITALS — BP 97/55 | HR 96 | Temp 98.5°F | Resp 18 | Ht 60.0 in | Wt 189.0 lb

## 2021-08-17 DIAGNOSIS — D0511 Intraductal carcinoma in situ of right breast: Secondary | ICD-10-CM | POA: Diagnosis not present

## 2021-08-17 DIAGNOSIS — IMO0002 Reserved for concepts with insufficient information to code with codable children: Secondary | ICD-10-CM

## 2021-08-17 DIAGNOSIS — Z809 Family history of malignant neoplasm, unspecified: Secondary | ICD-10-CM | POA: Insufficient documentation

## 2021-08-17 DIAGNOSIS — D75839 Thrombocytosis, unspecified: Secondary | ICD-10-CM | POA: Insufficient documentation

## 2021-08-17 DIAGNOSIS — G4730 Sleep apnea, unspecified: Secondary | ICD-10-CM

## 2021-08-17 DIAGNOSIS — M797 Fibromyalgia: Secondary | ICD-10-CM

## 2021-08-17 DIAGNOSIS — Z923 Personal history of irradiation: Secondary | ICD-10-CM | POA: Diagnosis not present

## 2021-08-17 DIAGNOSIS — I10 Essential (primary) hypertension: Secondary | ICD-10-CM

## 2021-08-17 DIAGNOSIS — D72829 Elevated white blood cell count, unspecified: Secondary | ICD-10-CM | POA: Insufficient documentation

## 2021-08-17 DIAGNOSIS — Z800 Family history of malignant neoplasm of digestive organs: Secondary | ICD-10-CM | POA: Diagnosis not present

## 2021-08-17 DIAGNOSIS — Z803 Family history of malignant neoplasm of breast: Secondary | ICD-10-CM | POA: Insufficient documentation

## 2021-08-17 LAB — CBC WITH DIFFERENTIAL (CANCER CENTER ONLY)
Abs Immature Granulocytes: 0.06 10*3/uL (ref 0.00–0.07)
Basophils Absolute: 0 10*3/uL (ref 0.0–0.1)
Basophils Relative: 0 %
Eosinophils Absolute: 0.7 10*3/uL — ABNORMAL HIGH (ref 0.0–0.5)
Eosinophils Relative: 5 %
HCT: 36.7 % (ref 36.0–46.0)
Hemoglobin: 11.5 g/dL — ABNORMAL LOW (ref 12.0–15.0)
Immature Granulocytes: 0 %
Lymphocytes Relative: 19 %
Lymphs Abs: 2.5 10*3/uL (ref 0.7–4.0)
MCH: 25.8 pg — ABNORMAL LOW (ref 26.0–34.0)
MCHC: 31.3 g/dL (ref 30.0–36.0)
MCV: 82.3 fL (ref 80.0–100.0)
Monocytes Absolute: 0.4 10*3/uL (ref 0.1–1.0)
Monocytes Relative: 3 %
Neutro Abs: 9.6 10*3/uL — ABNORMAL HIGH (ref 1.7–7.7)
Neutrophils Relative %: 73 %
Platelet Count: 466 10*3/uL — ABNORMAL HIGH (ref 150–400)
RBC: 4.46 MIL/uL (ref 3.87–5.11)
RDW: 13.6 % (ref 11.5–15.5)
WBC Count: 13.4 10*3/uL — ABNORMAL HIGH (ref 4.0–10.5)
nRBC: 0 % (ref 0.0–0.2)

## 2021-08-17 LAB — COMPREHENSIVE METABOLIC PANEL
ALT: 12 U/L (ref 0–44)
AST: 11 U/L — ABNORMAL LOW (ref 15–41)
Albumin: 3.6 g/dL (ref 3.5–5.0)
Alkaline Phosphatase: 80 U/L (ref 38–126)
Anion gap: 10 (ref 5–15)
BUN: 18 mg/dL (ref 6–20)
CO2: 26 mmol/L (ref 22–32)
Calcium: 9.6 mg/dL (ref 8.9–10.3)
Chloride: 104 mmol/L (ref 98–111)
Creatinine, Ser: 1.22 mg/dL — ABNORMAL HIGH (ref 0.44–1.00)
GFR, Estimated: 51 mL/min — ABNORMAL LOW (ref >60)
Glucose, Bld: 155 mg/dL — ABNORMAL HIGH (ref 70–99)
Potassium: 3.6 mmol/L (ref 3.5–5.1)
Sodium: 140 mmol/L (ref 135–145)
Total Bilirubin: <0.2 mg/dL — ABNORMAL LOW (ref 0.3–1.2)
Total Protein: 7.6 g/dL (ref 6.5–8.1)

## 2021-08-17 MED ORDER — LIDOCAINE HCL 2 % IJ SOLN
INTRAMUSCULAR | Status: AC
  Filled 2021-08-17: qty 20

## 2021-08-17 NOTE — Patient Instructions (Signed)
Bone Marrow Aspiration and Bone Marrow Biopsy, Adult, Care After This sheet gives you information about how to care for yourself after your procedure. Your health care provider may also give you more specific instructions. If you have problems or questions, contact your health care provider. What can I expect after the procedure? After the procedure, it is common to have: Mild pain and tenderness. Swelling. Bruising. Follow these instructions at home: Puncture site care  Follow instructions from your health care provider about how to take care of the puncture site. Make sure you: Wash your hands with soap and water before and after you change your bandage (dressing). If soap and water are not available, use hand sanitizer. Change your dressing as told by your health care provider. Check your puncture site every day for signs of infection. Check for: More redness, swelling, or pain. Fluid or blood. Warmth. Pus or a bad smell. Activity Return to your normal activities as told by your health care provider. Ask your health care provider what activities are safe for you. Do not lift anything that is heavier than 10 lb (4.5 kg), or the limit that you are told, until your health care provider says that it is safe. Do not drive for 24 hours if you were given a sedative during your procedure. General instructions  Take over-the-counter and prescription medicines only as told by your health care provider. Do not take baths, swim, or use a hot tub until your health care provider approves. Ask your health care provider if you may take showers. You may only be allowed to take sponge baths. If directed, put ice on the affected area. To do this: Put ice in a plastic bag. Place a towel between your skin and the bag. Leave the ice on for 20 minutes, 2-3 times a day. Keep all follow-up visits as told by your health care provider. This is important. Contact a health care provider if: Your pain is not  controlled with medicine. You have a fever. You have more redness, swelling, or pain around the puncture site. You have fluid or blood coming from the puncture site. Your puncture site feels warm to the touch. You have pus or a bad smell coming from the puncture site. Summary After the procedure, it is common to have mild pain, tenderness, swelling, and bruising. Follow instructions from your health care provider about how to take care of the puncture site and what activities are safe for you. Take over-the-counter and prescription medicines only as told by your health care provider. Contact a health care provider if you have any signs of infection, such as fluid or blood coming from the puncture site. This information is not intended to replace advice given to you by your health care provider. Make sure you discuss any questions you have with your health care provider. Document Revised: 04/21/2019 Document Reviewed: 04/21/2019 Elsevier Patient Education  2022 Elsevier Inc.  

## 2021-08-17 NOTE — Progress Notes (Signed)
Bone marrow biopsy and aspiration completed by Wilber Bihari, NP this morning. Procedure began at 0809 with time out completed and concluded at 0830. Bandage applied and patient observed for 30 minutes post-procedure.  Bandage observed and remains clean, dry, and intact. Education provided with patient verbalizing an understanding of the teaching.

## 2021-08-17 NOTE — Progress Notes (Signed)
INDICATION: lymphocytosis and thrombocytosis  Brief examination was performed. ENT: adequate airway clearance Heart: regular rate and rhythm.No Murmurs Lungs: clear to auscultation, no wheezes, normal respiratory effort  Bone Marrow Biopsy and Aspiration Procedure Note   Informed consent was obtained and potential risks including bleeding, infection and pain were reviewed with the patient.  The patient's name, date of birth, identification, consent and allergies were verified prior to the start of procedure and time out was performed.  The left posterior iliac crest was chosen as the site of biopsy.  The skin was prepped with ChloraPrep.   15 cc of 2% lidocaine was used to provide local anaesthesia.   10 cc of bone marrow aspirate was obtained followed by 0.9 cm biopsy.  Pressure was applied to the biopsy site and bandage was placed over the biopsy site. Patient was made to lie on the back for 30 mins prior to discharge.  The procedure was tolerated well. COMPLICATIONS: None BLOOD LOSS: none The patient was discharged home in stable condition with a 2 week follow up to review results.  Patient was provided with post bone marrow biopsy instructions and instructed to call if there was any bleeding or worsening pain.  Specimens sent for flow cytometry, cytogenetics and additional studies.  Signed  C , NP  

## 2021-08-20 LAB — SURGICAL PATHOLOGY

## 2021-08-23 ENCOUNTER — Encounter: Payer: Self-pay | Admitting: Oncology

## 2021-08-28 ENCOUNTER — Encounter (HOSPITAL_COMMUNITY): Payer: Self-pay | Admitting: Oncology

## 2021-08-29 ENCOUNTER — Encounter (HOSPITAL_COMMUNITY): Payer: Self-pay | Admitting: Oncology

## 2021-09-07 ENCOUNTER — Other Ambulatory Visit: Payer: Self-pay

## 2021-09-07 ENCOUNTER — Inpatient Hospital Stay (HOSPITAL_BASED_OUTPATIENT_CLINIC_OR_DEPARTMENT_OTHER): Payer: Medicare Other | Admitting: Adult Health

## 2021-09-07 VITALS — BP 124/67 | HR 99 | Temp 98.0°F | Ht 60.0 in | Wt 188.4 lb

## 2021-09-07 DIAGNOSIS — D75839 Thrombocytosis, unspecified: Secondary | ICD-10-CM | POA: Diagnosis not present

## 2021-09-07 DIAGNOSIS — D0511 Intraductal carcinoma in situ of right breast: Secondary | ICD-10-CM | POA: Diagnosis not present

## 2021-09-07 NOTE — Progress Notes (Addendum)
[place]  Telephone:([phone]) [phone] Fax:([phone]     ID: Christina Webster DOB: [birth year]  MR#: [id number]  OHF#:[id number]  Patient Care Team: Christina Huh, NP as PCP - General (Nurse Practitioner) Christina Kaufmann, RN as Oncology Nurse Navigator Christina Germany, RN as Oncology Nurse Navigator Mansouraty, [name], MD as Consulting Physician (Gastroenterology) Magrinat, Virgie Dad, MD as Consulting Physician (Oncology) Christina Rudd, MD as Consulting Physician (Radiation Oncology) Christina Luna, MD as Consulting Physician (General Surgery) Christina Dock, NP OTHER MD:  CHIEF COMPLAINT: Ductal carcinoma in situ, 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  She underwent a bone marrow biopsy on 08/17/2021 that showed hypercellular marrow and atypical megakaryocytes.  Results from further studies evaluating JAK-2, BCR ABL, MPL, CALR, are still pending per Dr. Tresa Moore in pathology.   Christina Webster says that she had some soreness after the bone marrow, however that has resolved.  Otherwise she is doing well and is taking her anastrozole daily.  Her most recent breast exam was completed by Dr. Jana Webster last month.    REVIEW OF SYSTEMS: Review of Systems  Constitutional:  Negative for appetite change, chills, fatigue, fever and unexpected weight change.  HENT:   Negative for hearing loss, lump/mass and trouble swallowing.   Eyes:  Negative for eye problems and icterus.  Respiratory:  Negative for chest tightness, cough and shortness of breath.   Cardiovascular:  Negative for chest pain, leg swelling and palpitations.  Gastrointestinal:  Negative for abdominal distention, abdominal pain, constipation, diarrhea, nausea and vomiting.  Endocrine: Negative for hot flashes.  Genitourinary:  Negative for difficulty  urinating.   Musculoskeletal:  Negative for arthralgias.  Skin:  Negative for itching and rash.  Neurological:  Negative for dizziness, extremity weakness, headaches and numbness.  Hematological:  Negative for adenopathy. Does not bruise/bleed easily.  Psychiatric/Behavioral:  Negative for depression. The patient is not nervous/anxious.      COVID 19 VACCINATION STATUS: Christina Webster x3, most recently 11/2020   HISTORY OF CURRENT ILLNESS:    From the original intake note:  Christina Webster" had routine screening mammography on 09/26/2019 showing a possible abnormality in the right breast. She underwent right diagnostic mammography with tomography at [facility] on 10/01/2019 showing: breast density category C; 1.4 cm linear calcifications in the right breast, upper-outer quadrant.  Accordingly on 10/14/2019 she proceeded to biopsy of the right breast area in question. 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 Prognostic indicators significant for: estrogen receptor, 100% positive with strong staining intensity and progesterone receptor, 0% negative.   The patient's subsequent history is as detailed below.   PAST MEDICAL HISTORY: Past Medical History:  Diagnosis Date   Anemia    Anxiety and depression    Cancer ([place])    right breast can - recent dx   Chronic back pain    Diabetic peripheral neuropathy ([place])    Family history of breast cancer    Family history of stomach cancer    Fibromyalgia    Hypercholesteremia    Hypertension    Migraines    occasional    Type II diabetes mellitus ([place])     PAST SURGICAL HISTORY: Past Surgical History:  Procedure Laterality Date   BREAST LUMPECTOMY WITH RADIOACTIVE SEED AND SENTINEL LYMPH NODE BIOPSY Right 11/04/2019  Procedure: RIGHT BREAST LUMPECTOMY WITH RADIOACTIVE SEED AND SENTINEL LYMPH NODE MAPPING;  Surgeon: Christina Luna, MD;  Location: [facility];   Service: General;  Laterality: Right;   COLONOSCOPY     TUBAL LIGATION  1988    FAMILY HISTORY: Family History  Problem Relation Age of Onset   Hypertension Other    Diabetes Other    Cancer Other    Hyperlipidemia Other    Sleep apnea Other    Breast cancer Mother 57   Diabetes Mother    Hypertension Mother    Other Father        Unsure of medical history.   Breast cancer Maternal Aunt 46   Breast cancer Cousin 4       maternal cousin   Healthy Half-Sister    Healthy Half-Brother    Healthy Half-Sister    Stomach cancer Maternal Aunt 64  The patient has no information regarding her father or his side of the family.  Patient's mother is 45 years old as of November 2020.  The patient has 1 brother, 2 sisters.  The patient's mother had breast cancer at age 67.  There is also a maternal aunt and 2 maternal cousins with breast cancer.   GYNECOLOGIC HISTORY:  Patient's last menstrual period was 11/24/2013 (lmp unknown). Menarche: 60 years old Age at first live birth: 60 years old [place] P 3 LMP 2014 HRT no  Hysterectomy? no BSO? no   SOCIAL HISTORY: (updated 10/2019)  Christina "Candy" is disabled secondary to fibromyalgia. She is single. She lives at home with her daughters Christina Webster and Christina Webster and 4 grandchildren.  Daughter Christina Webster lives in [place] and currently is unemployed but has worked at a call center.  Daughter Christina Webster works as a Radiation protection practitioner and daughter Christina Webster also work lives in [place] and works as a Freight forwarder from home.  The patient is not a church attender    ADVANCED DIRECTIVES: Not in place but she intends to name her daughter Christina Webster as healthcare power of attorney.  Christina Webster can be reached at [phone]   HEALTH MAINTENANCE: Social History   Tobacco Use   Smoking status: Never   Smokeless tobacco: Never  Vaping Use   Vaping Use: Never used  Substance Use Topics   Alcohol use: Yes    Comment: -occasional   Drug use: No     Colonoscopy: 08/2018 (Dr.  Rush Landmark), repeat 2029  PAP: none on file  Bone density: none on file   No Known Allergies  Current Outpatient Medications  Medication Sig Dispense Refill   anastrozole (ARIMIDEX) 1 MG tablet TAKE 1 TABLET BY MOUTH EVERY DAY 90 tablet 3   aspirin EC 81 MG tablet Take 81 mg by mouth daily.     cyclobenzaprine (FLEXERIL) 10 MG tablet Take 1 tablet (10 mg total) by mouth 2 (two) times daily as needed for up to 29 doses for muscle spasms. 20 tablet 0   diclofenac sodium (VOLTAREN) 1 % GEL Apply 2 g topically daily as needed (Body).     Insulin Lispro Prot & Lispro (HUMALOG MIX 75/25 KWIKPEN) (75-25) 100 UNIT/ML Kwikpen Inject 35-50 Units into the skin See admin instructions. 50 units in the morning and 35 units in the eveing     losartan-hydrochlorothiazide (HYZAAR) 50-12.5 MG tablet Take 1 tablet by mouth daily.   3   Magnesium 200 MG TABS Take 400 mg by mouth at bedtime.      SYNJARDY XR 12.04-999 MG TB24 Take 2 tablets by mouth daily.  traMADol (ULTRAM) 50 MG tablet Take 50 mg by mouth 5 (five) times daily as needed for moderate pain.      No current facility-administered medications for this visit.    OBJECTIVE: African-American woman who appears older than stated age  69:   09/07/21 1430  BP: 124/67  Pulse: 99  Temp: 98 F (36.7 C)      Body mass index is 36.79 kg/m.   Wt Readings from Last 3 Encounters:  09/07/21 188 lb 6.4 oz (85.5 kg)  08/17/21 189 lb (85.7 kg)  08/03/21 189 lb 8 oz (86 kg)     ECOG FS:1 - Symptomatic but completely ambulatory GENERAL: Patient is a well appearing female in no acute distress HEENT:  Sclerae anicteric.  Oropharynx clear and moist. No ulcerations or evidence of oropharyngeal candidiasis. Neck is supple.  NODES:  No cervical, supraclavicular, or axillary lymphadenopathy palpated.  BREAST EXAM:  Deferred. LUNGS:  Clear to auscultation bilaterally.  No wheezes or rhonchi. HEART:  Regular rate and rhythm. No murmur  appreciated. ABDOMEN:  Soft, nontender.  Positive, normoactive bowel sounds. No organomegaly palpated. MSK:  No focal spinal tenderness to palpation. Full range of motion bilaterally in the upper extremities. EXTREMITIES:  No peripheral edema.   SKIN:  Clear with no obvious rashes or skin changes. No nail dyscrasia. NEURO:  Nonfocal. Well oriented.  Appropriate affect.   LAB RESULTS:  CMP     Component Value Date/Time   NA 140 08/17/2021 0835   K 3.6 08/17/2021 0835   CL 104 08/17/2021 0835   CO2 26 08/17/2021 0835   GLUCOSE 155 (H) 08/17/2021 0835   BUN 18 08/17/2021 0835   CREATININE 1.22 (H) 08/17/2021 0835   CREATININE 1.37 (H) 10/21/2019 1232   CALCIUM 9.6 08/17/2021 0835   PROT 7.6 08/17/2021 0835   ALBUMIN 3.6 08/17/2021 0835   AST 11 (L) 08/17/2021 0835   AST 14 (L) 10/21/2019 1232   ALT 12 08/17/2021 0835   ALT 25 10/21/2019 1232   ALKPHOS 80 08/17/2021 0835   BILITOT <0.2 (L) 08/17/2021 0835   BILITOT <0.2 (L) 10/21/2019 1232   GFRNONAA 51 (L) 08/17/2021 0835   GFRNONAA 43 (L) 10/21/2019 1232   GFRAA >60 10/30/2019 0853   GFRAA 49 (L) 10/21/2019 1232    No results found for: TOTALPROTELP, ALBUMINELP, A1GS, A2GS, BETS, BETA2SER, GAMS, MSPIKE, SPEI  No results found for: KPAFRELGTCHN, LAMBDASER, KAPLAMBRATIO  Lab Results  Component Value Date   WBC 13.4 (H) 08/17/2021   NEUTROABS 9.6 (H) 08/17/2021   HGB 11.5 (L) 08/17/2021   HCT 36.7 08/17/2021   MCV 82.3 08/17/2021   PLT 466 (H) 08/17/2021   No results found for: LABCA2  No components found for: TZGYFV494  No results for input(s): INR in the last 168 hours.  No results found for: LABCA2  No results found for: WHQ759  No results found for: FMB846  No results found for: KZL935  No results found for: CA2729  No components found for: HGQUANT  No results found for: CEA1 / No results found for: CEA1   No results found for: AFPTUMOR  No results found for: CHROMOGRNA  No results found for:  HGBA, HGBA2QUANT, HGBFQUANT, HGBSQUAN (Hemoglobinopathy evaluation)   No results found for: LDH  No results found for: IRON, TIBC, IRONPCTSAT (Iron and TIBC)  No results found for: FERRITIN  Urinalysis    Component Value Date/Time   COLORURINE YELLOW 12/07/2013 1234   APPEARANCEUR TURBID (A) 12/07/2013 1234  LABSPEC 1.035 (H) 12/07/2013 1234   PHURINE 5.5 12/07/2013 1234   GLUCOSEU >1000 (A) 12/07/2013 1234   HGBUR MODERATE (A) 12/07/2013 1234   BILIRUBINUR NEGATIVE 12/07/2013 1234   KETONESUR NEGATIVE 12/07/2013 1234   PROTEINUR 30 (A) 12/07/2013 1234   UROBILINOGEN 0.2 12/07/2013 1234   NITRITE NEGATIVE 12/07/2013 1234   LEUKOCYTESUR LARGE (A) 12/07/2013 1234    STUDIES: No results found.   ELIGIBLE FOR AVAILABLE RESEARCH PROTOCOL: Not a comment candidate  ASSESSMENT: 60 y.o. 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, estrogen and progesterone receptor positive  (1) genetics testing 11/09/2019 through the [facility] Breast Cancer STAT panel and Common Hereditary Cancers panel found no deleterious mutations in  ATM, BRCA1, BRCA2, CDH1, CHEK2, PALB2, PTEN, STK11 and TP53, APC, ATM, AXIN2, BARD1, BMPR1A, BRCA1, BRCA2, BRIP1, CDH1, CDK4, CDKN2A (p14ARF), CDKN2A (p16INK4a), CHEK2, CTNNA1, DICER1, EPCAM (Deletion/duplication testing only), GREM1 (promoter region deletion/duplication testing only), KIT, MEN1, MLH1, MSH2, MSH3, MSH6, MUTYH, NBN, NF1, NHTL1, PALB2, PDGFRA, PMS2, POLD1, POLE, PTEN, RAD50, RAD51C, RAD51D, RNF43, SDHB, SDHC, SDHD, SMAD4, SMARCA4. STK11, TP53, TSC1, TSC2, and VHL.  The following genes were evaluated for sequence changes only: SDHA and HOXB13 c.251G>A variant only.  (a) two variants of uncertain significance were detected: one in the MSH3 gene called c.1502G>A, and one in the POLD1 gene called c.3290G>A.   (2) status post right lumpectomy and sentinel lymph node sampling 11/04/2019  for ductal carcinoma in situ, 1.1 cm, grade 2, with negative margins.  (a) 3 right axilla sentinel lymph nodes were clear  (3) adjuvant radiation 12/07/2019 through 01/22/2020 The right breast was treated to 50.4 Gy in 28 fractions followed by a 10 Gy boost in 5 fractions to the surgical cavity.   (4) started anastrozole 02/15/2020  (a) bone density  (5) persistent leukocytosis and thrombocytosis  (a) bone marrow biopsy 96/12/2020    PLAN: "Candy" has no sign of breast cancer recurrence and will continue on Anastrozole daily.    As far as her thrombocytosis and leukocystosis is concerned, she met with Dr. Jana Webster and reviewed in detail his thoughts.  We spent about 30-45 minutes today on the phone with pathology looking for her results from the additional molecular testing that was sent.  Pathology plans to check with [facility] about the status of additional testing that was ordered.  We also asked that they add a fibrosis stain to the bone marrow.    Dr. Jana Webster reviewed with Christina Webster that he believes that she likely has essential thrombocytosis, however further studies are pending.  While we wait on these studies she will start hydroxyurea at 549m daily.  We reviewed risks and benefits of this medication with her in detail.  She knows to call if she has any trouble with taking it.    We will see her back in 4 weeks for labs and f/u, and in the meantime will continue to f/u with pathology regarding the additional results.    Total encounter time 45 minutes.*Wilber Bihari NP 09/07/21 2:36 PM [facility] On[facility] [id number]. [phone]   Fax. [phone]  ADDENDUM: CFreida Busmanhas had persistent thrombocytosis and a bone marrow consistent with essential thrombocytosis.  We do not have the confirmatory JAK2 and associated molecular studies yet available and I would like a fibrosis stain on the bone marrow just  to make sure to rule  out that possibility.  Given what we now know I think it would be prudent to start her on Hydrea 500 mg and see how she tolerates it.  She will return to see [place] after being on this medication about a month and we will check counts at that time.  Then we should have the final results from her bone marrow biopsy.  I personally saw this patient and performed a substantive portion of this encounter with the listed APP documented above.   Chauncey Cruel, MD Medical Oncology and Hematology [facility] [address] [place], [place] [address] Tel. [phone]    Fax. [phone]      *Total Encounter Time as defined by the Centers for Medicare and Medicaid Services includes, in addition to the face-to-face time of a patient visit (documented in the note above) non-face-to-face time: obtaining and reviewing outside history, ordering and reviewing medications, tests or procedures, care coordination (communications with other health care professionals or caregivers) and documentation in the medical record.

## 2021-09-08 ENCOUNTER — Telehealth: Payer: Self-pay | Admitting: *Deleted

## 2021-09-08 ENCOUNTER — Other Ambulatory Visit: Payer: Self-pay | Admitting: *Deleted

## 2021-09-08 LAB — SURGICAL PATHOLOGY

## 2021-09-08 NOTE — Telephone Encounter (Signed)
This RN spoke with Christina Webster in Flow/cytometry per [facility] labs not showing on report : Jak2,MPL and CALRC<.  Per review labs were "not done " per oversight and " can run from peripheral lab draw and send to [place]" ( [facility] specimen is " too old to use " to add from the sample ).  The fibrosis stain is being added.  This note will be forwarded to [facility]/NP.

## 2021-09-08 NOTE — Telephone Encounter (Signed)
Please  call patient and review with her and have her come in for peripheral blood draw.    Thanks, Mendel Ryder

## 2021-09-09 ENCOUNTER — Encounter: Payer: Self-pay | Admitting: Adult Health

## 2021-09-14 ENCOUNTER — Telehealth: Payer: Self-pay

## 2021-09-14 ENCOUNTER — Other Ambulatory Visit: Payer: Self-pay

## 2021-09-14 DIAGNOSIS — D0511 Intraductal carcinoma in situ of right breast: Secondary | ICD-10-CM

## 2021-09-14 NOTE — Telephone Encounter (Signed)
Pt scheduled for lab appt 10/6 at 1215 per NP. Pt is aware and verbalized thanks.

## 2021-09-18 ENCOUNTER — Other Ambulatory Visit: Payer: Self-pay | Admitting: Adult Health

## 2021-09-18 DIAGNOSIS — D75839 Thrombocytosis, unspecified: Secondary | ICD-10-CM

## 2021-09-21 ENCOUNTER — Inpatient Hospital Stay: Payer: Medicare Other | Attending: Oncology

## 2021-09-21 ENCOUNTER — Other Ambulatory Visit: Payer: Self-pay

## 2021-09-21 DIAGNOSIS — D0511 Intraductal carcinoma in situ of right breast: Secondary | ICD-10-CM | POA: Diagnosis not present

## 2021-09-21 DIAGNOSIS — G4730 Sleep apnea, unspecified: Secondary | ICD-10-CM

## 2021-09-21 DIAGNOSIS — Z79899 Other long term (current) drug therapy: Secondary | ICD-10-CM | POA: Diagnosis not present

## 2021-09-21 DIAGNOSIS — D75839 Thrombocytosis, unspecified: Secondary | ICD-10-CM

## 2021-09-21 DIAGNOSIS — Z803 Family history of malignant neoplasm of breast: Secondary | ICD-10-CM | POA: Diagnosis not present

## 2021-09-21 DIAGNOSIS — D473 Essential (hemorrhagic) thrombocythemia: Secondary | ICD-10-CM | POA: Insufficient documentation

## 2021-09-21 DIAGNOSIS — I10 Essential (primary) hypertension: Secondary | ICD-10-CM

## 2021-09-21 DIAGNOSIS — Z1501 Genetic susceptibility to malignant neoplasm of breast: Secondary | ICD-10-CM | POA: Diagnosis not present

## 2021-09-21 DIAGNOSIS — IMO0002 Reserved for concepts with insufficient information to code with codable children: Secondary | ICD-10-CM

## 2021-09-21 DIAGNOSIS — M797 Fibromyalgia: Secondary | ICD-10-CM

## 2021-09-21 LAB — COMPREHENSIVE METABOLIC PANEL
ALT: 14 U/L (ref 0–44)
AST: 14 U/L — ABNORMAL LOW (ref 15–41)
Albumin: 3.8 g/dL (ref 3.5–5.0)
Alkaline Phosphatase: 86 U/L (ref 38–126)
Anion gap: 10 (ref 5–15)
BUN: 19 mg/dL (ref 6–20)
CO2: 26 mmol/L (ref 22–32)
Calcium: 9.6 mg/dL (ref 8.9–10.3)
Chloride: 105 mmol/L (ref 98–111)
Creatinine, Ser: 1.32 mg/dL — ABNORMAL HIGH (ref 0.44–1.00)
GFR, Estimated: 47 mL/min — ABNORMAL LOW (ref >60)
Glucose, Bld: 136 mg/dL — ABNORMAL HIGH (ref 70–99)
Potassium: 3.4 mmol/L — ABNORMAL LOW (ref 3.5–5.1)
Sodium: 141 mmol/L (ref 135–145)
Total Bilirubin: <0.2 mg/dL — ABNORMAL LOW (ref 0.3–1.2)
Total Protein: 8 g/dL (ref 6.5–8.1)

## 2021-09-21 LAB — CBC WITH DIFFERENTIAL/PLATELET
Abs Immature Granulocytes: 0.03 10*3/uL (ref 0.00–0.07)
Basophils Absolute: 0 10*3/uL (ref 0.0–0.1)
Basophils Relative: 0 %
Eosinophils Absolute: 1 10*3/uL — ABNORMAL HIGH (ref 0.0–0.5)
Eosinophils Relative: 7 %
HCT: 38 % (ref 36.0–46.0)
Hemoglobin: 11.9 g/dL — ABNORMAL LOW (ref 12.0–15.0)
Immature Granulocytes: 0 %
Lymphocytes Relative: 26 %
Lymphs Abs: 3.6 10*3/uL (ref 0.7–4.0)
MCH: 25.5 pg — ABNORMAL LOW (ref 26.0–34.0)
MCHC: 31.3 g/dL (ref 30.0–36.0)
MCV: 81.4 fL (ref 80.0–100.0)
Monocytes Absolute: 0.5 10*3/uL (ref 0.1–1.0)
Monocytes Relative: 3 %
Neutro Abs: 8.6 10*3/uL — ABNORMAL HIGH (ref 1.7–7.7)
Neutrophils Relative %: 64 %
Platelets: 463 10*3/uL — ABNORMAL HIGH (ref 150–400)
RBC: 4.67 MIL/uL (ref 3.87–5.11)
RDW: 13.9 % (ref 11.5–15.5)
WBC: 13.7 10*3/uL — ABNORMAL HIGH (ref 4.0–10.5)
nRBC: 0 % (ref 0.0–0.2)

## 2021-09-26 ENCOUNTER — Other Ambulatory Visit: Payer: Self-pay | Admitting: Oncology

## 2021-09-28 LAB — BCR ABL1 FISH (GENPATH)

## 2021-09-28 LAB — JAK2 (INCLUDING V617F AND EXON 12), MPL,& CALR-NEXT GEN SEQ

## 2021-10-12 ENCOUNTER — Telehealth: Payer: Self-pay

## 2021-10-12 ENCOUNTER — Inpatient Hospital Stay (HOSPITAL_BASED_OUTPATIENT_CLINIC_OR_DEPARTMENT_OTHER): Payer: Medicare Other | Admitting: Adult Health

## 2021-10-12 ENCOUNTER — Other Ambulatory Visit: Payer: Self-pay

## 2021-10-12 ENCOUNTER — Inpatient Hospital Stay: Payer: Medicare Other

## 2021-10-12 ENCOUNTER — Encounter: Payer: Self-pay | Admitting: Adult Health

## 2021-10-12 VITALS — BP 146/79 | HR 103 | Temp 97.3°F | Resp 18 | Ht 60.0 in | Wt 188.4 lb

## 2021-10-12 DIAGNOSIS — G4730 Sleep apnea, unspecified: Secondary | ICD-10-CM

## 2021-10-12 DIAGNOSIS — D0511 Intraductal carcinoma in situ of right breast: Secondary | ICD-10-CM | POA: Diagnosis not present

## 2021-10-12 DIAGNOSIS — I10 Essential (primary) hypertension: Secondary | ICD-10-CM

## 2021-10-12 DIAGNOSIS — D75839 Thrombocytosis, unspecified: Secondary | ICD-10-CM

## 2021-10-12 DIAGNOSIS — M797 Fibromyalgia: Secondary | ICD-10-CM

## 2021-10-12 DIAGNOSIS — IMO0002 Reserved for concepts with insufficient information to code with codable children: Secondary | ICD-10-CM

## 2021-10-12 LAB — CBC WITH DIFFERENTIAL/PLATELET
Abs Immature Granulocytes: 0.04 10*3/uL (ref 0.00–0.07)
Basophils Absolute: 0 10*3/uL (ref 0.0–0.1)
Basophils Relative: 0 %
Eosinophils Absolute: 0.7 10*3/uL — ABNORMAL HIGH (ref 0.0–0.5)
Eosinophils Relative: 4 %
HCT: 39.7 % (ref 36.0–46.0)
Hemoglobin: 12.4 g/dL (ref 12.0–15.0)
Immature Granulocytes: 0 %
Lymphocytes Relative: 25 %
Lymphs Abs: 3.9 10*3/uL (ref 0.7–4.0)
MCH: 25.4 pg — ABNORMAL LOW (ref 26.0–34.0)
MCHC: 31.2 g/dL (ref 30.0–36.0)
MCV: 81.4 fL (ref 80.0–100.0)
Monocytes Absolute: 0.6 10*3/uL (ref 0.1–1.0)
Monocytes Relative: 4 %
Neutro Abs: 10.6 10*3/uL — ABNORMAL HIGH (ref 1.7–7.7)
Neutrophils Relative %: 67 %
Platelets: 510 10*3/uL — ABNORMAL HIGH (ref 150–400)
RBC: 4.88 MIL/uL (ref 3.87–5.11)
RDW: 14.3 % (ref 11.5–15.5)
WBC: 15.8 10*3/uL — ABNORMAL HIGH (ref 4.0–10.5)
nRBC: 0 % (ref 0.0–0.2)

## 2021-10-12 LAB — COMPREHENSIVE METABOLIC PANEL
ALT: 14 U/L (ref 0–44)
AST: 12 U/L — ABNORMAL LOW (ref 15–41)
Albumin: 3.8 g/dL (ref 3.5–5.0)
Alkaline Phosphatase: 84 U/L (ref 38–126)
Anion gap: 12 (ref 5–15)
BUN: 18 mg/dL (ref 6–20)
CO2: 26 mmol/L (ref 22–32)
Calcium: 9.9 mg/dL (ref 8.9–10.3)
Chloride: 102 mmol/L (ref 98–111)
Creatinine, Ser: 1.26 mg/dL — ABNORMAL HIGH (ref 0.44–1.00)
GFR, Estimated: 49 mL/min — ABNORMAL LOW (ref >60)
Glucose, Bld: 59 mg/dL — ABNORMAL LOW (ref 70–99)
Potassium: 3.6 mmol/L (ref 3.5–5.1)
Sodium: 140 mmol/L (ref 135–145)
Total Bilirubin: 0.3 mg/dL (ref 0.3–1.2)
Total Protein: 8.3 g/dL — ABNORMAL HIGH (ref 6.5–8.1)

## 2021-10-12 MED ORDER — HYDROXYUREA 500 MG PO CAPS: 500.0000 mg | ORAL_CAPSULE | Freq: Every day | ORAL | 4 refills | Status: AC

## 2021-10-12 NOTE — Progress Notes (Signed)
[facility]  Telephone:([phone]) [phone] Fax:(336) [phone]     ID: Christina Webster DOB: 26-Jul-1961  MR#: [id number]  QPY#:[id number]  Patient Care Team: Simona Huh, NP as PCP - General (Nurse Practitioner) Mauro Kaufmann, RN as Oncology Nurse Navigator Rockwell Germany, RN as Oncology Nurse Navigator Mansouraty, [name], MD as Consulting Physician (Gastroenterology) Mallori Araque, Virgie Dad, MD as Consulting Physician (Oncology) Kyung Rudd, MD as Consulting Physician (Radiation Oncology) [name], MD as Consulting Physician (General Surgery) Chauncey Cruel, MD OTHER MD:  CHIEF COMPLAINT: Ductal carcinoma in situ; posible essential thrombocytosis  CURRENT TREATMENT: Anastrozole, to start Hydroxyurea   INTERVAL HISTORY: Christina "Christina Webster" returns today for follow up of her 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  She underwent a bone marrow biopsy on 08/17/2021 that showed hypercellular marrow and atypical megakaryocytes. Further pathology evaluation was negative for JAK-2, MCL, CALR and BCR-ABL.    Christina Webster says that she is fatigued.  She denies any new issues.  She is taking anastrozole daily with good tolerance.  She has not had a mammogram because [facility] says she needs one that insurance will not cover.  She does not want to pay out of pocket for her mammogram, so she has not gone to [facility] to undergo this.    REVIEW OF SYSTEMS: Review of Systems  Constitutional:  Positive for fatigue. Negative for appetite change, chills, fever and unexpected weight change.  HENT:   Negative for hearing loss, lump/mass and trouble swallowing.   Eyes:  Negative for eye problems and icterus.  Respiratory:  Negative for chest tightness, cough and shortness of breath.   Cardiovascular:  Negative for chest pain, leg swelling and palpitations.  Gastrointestinal:  Negative for abdominal distention, abdominal pain, constipation, diarrhea, nausea and vomiting.  Endocrine: Negative  for hot flashes.  Genitourinary:  Negative for difficulty urinating.   Musculoskeletal:  Negative for arthralgias.  Skin:  Negative for itching and rash.  Neurological:  Negative for dizziness, extremity weakness, headaches and numbness.  Hematological:  Negative for adenopathy. Does not bruise/bleed easily.  Psychiatric/Behavioral:  Negative for depression. The patient is not nervous/anxious.        COVID 19 VACCINATION STATUS: [place] x3, most recently 11/2020   HISTORY OF CURRENT ILLNESS:    From the original intake note:  Christina Webster" had routine screening mammography on 09/26/2019 showing a possible abnormality in the right breast. She underwent right diagnostic mammography with tomography at [facility] on 10/01/2019 showing: breast density category C; 1.4 cm linear calcifications in the right breast, upper-outer quadrant.  Accordingly on 10/14/2019 she proceeded to biopsy of the right breast area in question. 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 Prognostic indicators significant for: estrogen receptor, 100% positive with strong staining intensity and progesterone receptor, 0% negative.   The patient's subsequent history is as detailed below.   PAST MEDICAL HISTORY: Past Medical History:  Diagnosis Date   Anemia    Anxiety and depression    Cancer ([place])    right breast can - recent dx   Chronic back pain    Diabetic peripheral neuropathy (HCC)    Family history of breast cancer    Family history of stomach cancer    Fibromyalgia    Hypercholesteremia    Hypertension    Migraines    occasional    Type II diabetes mellitus ([place])     PAST SURGICAL HISTORY: Past Surgical History:  Procedure Laterality Date   BREAST LUMPECTOMY WITH RADIOACTIVE SEED AND SENTINEL LYMPH NODE BIOPSY Right 11/04/2019   Procedure: RIGHT BREAST LUMPECTOMY WITH RADIOACTIVE SEED AND SENTINEL LYMPH  NODE MAPPING;  Surgeon: [name], MD;  Location: [place];  Service: General;  Laterality: Right;   COLONOSCOPY     TUBAL LIGATION  1988    FAMILY HISTORY: Family History  Problem Relation Age of Onset   Hypertension Other    Diabetes Other    Cancer Other    Hyperlipidemia Other    Sleep apnea Other    Breast cancer Mother 43   Diabetes Mother    Hypertension Mother    Other Father        Unsure of medical history.   Breast cancer Maternal Aunt 43   Breast cancer Cousin 50       maternal cousin   Healthy Half-Sister    Healthy Half-Brother    Healthy Half-Sister    Stomach cancer Maternal Aunt 64  The patient has no information regarding her father or his side of the family.  Patient's mother is 8 years old as of November 2020.  The patient has 1 brother, 2 sisters.  The patient's mother had breast cancer at age 52.  There is also a maternal aunt and 2 maternal cousins with breast cancer.   GYNECOLOGIC HISTORY:  Patient's last menstrual period was 11/24/2013 (lmp unknown). Menarche: 60 years old Age at first live birth: 60 years old [place] P 3 LMP 2014 HRT no  Hysterectomy? no BSO? no   SOCIAL HISTORY: (updated 10/2019)  Christina "Christina Webster" is disabled secondary to fibromyalgia. She is single. She lives at home with her daughters [name] and [name] and 4 grandchildren.  Daughter [name] lives in [place] and currently is unemployed but has worked at a call center.  Daughter [name] works as a Radiation protection practitioner and daughter [name] also work lives in [place] and works as a Freight forwarder from home.  The patient is not a church attender    ADVANCED DIRECTIVES: Not in place but she intends to name her daughter [name] as healthcare power of attorney.  [name] can be reached at [phone]   HEALTH MAINTENANCE: Social History   Tobacco Use   Smoking status: Never   Smokeless tobacco: Never  Vaping Use   Vaping Use: Never used  Substance Use Topics   Alcohol use: Yes    Comment:  -occasional   Drug use: No     Colonoscopy: 08/2018 (Dr. Rush Landmark), repeat 2029  PAP: none on file  Bone density: none on file   No Known Allergies  Current Outpatient Medications  Medication Sig Dispense Refill   hydroxyurea (HYDREA) 500 MG capsule Take 1 capsule (500 mg total) by mouth daily. May take with food to minimize GI side effects. 90 capsule 4   anastrozole (ARIMIDEX) 1 MG tablet TAKE 1 TABLET BY MOUTH EVERY DAY 90 tablet 3   aspirin EC 81 MG tablet Take 81 mg by mouth daily.     cyclobenzaprine (FLEXERIL) 10 MG tablet Take 1 tablet (10 mg total) by mouth 2 (two) times daily as needed for up to 29 doses for muscle spasms. 20 tablet 0   diclofenac sodium (VOLTAREN) 1 % GEL Apply 2 g topically daily as needed (Body).     Insulin Lispro Prot & Lispro (HUMALOG MIX 75/25 KWIKPEN) (75-25) 100 UNIT/ML Kwikpen Inject 35-50 Units into the skin See admin instructions. 50 units in the morning and 35 units in the eveing  losartan-hydrochlorothiazide (HYZAAR) 50-12.5 MG tablet Take 1 tablet by mouth daily.   3   Magnesium 200 MG TABS Take 400 mg by mouth at bedtime.      SYNJARDY XR 12.04-999 MG TB24 Take 2 tablets by mouth daily.      traMADol (ULTRAM) 50 MG tablet Take 50 mg by mouth 5 (five) times daily as needed for moderate pain.      No current facility-administered medications for this visit.    OBJECTIVE: African-American woman who appears older than stated age  43:   10/12/21 1145  BP: (!) 146/79  Pulse: (!) 103  Resp: 18  Temp: (!) 97.3 F (36.3 C)  SpO2: 99%      Body mass index is 36.79 kg/m.   Wt Readings from Last 3 Encounters:  10/12/21 188 lb 6.4 oz (85.5 kg)  09/07/21 188 lb 6.4 oz (85.5 kg)  08/17/21 189 lb (85.7 kg)     ECOG FS:1 - Symptomatic but completely ambulatory GENERAL: Patient is a well appearing female in no acute distress HEENT:  Sclerae anicteric.  Oropharynx clear and moist. No ulcerations or evidence of oropharyngeal candidiasis.  Neck is supple.  NODES:  No cervical, supraclavicular, or axillary lymphadenopathy palpated.  BREAST EXAM:  Deferred--last exam in August. LUNGS:  Clear to auscultation bilaterally.  No wheezes or rhonchi. HEART:  Regular rate and rhythm. No murmur appreciated. ABDOMEN:  Soft, nontender.  Positive, normoactive bowel sounds. No organomegaly palpated. MSK:  No focal spinal tenderness to palpation. Full range of motion bilaterally in the upper extremities. EXTREMITIES:  No peripheral edema.   SKIN:  Clear with no obvious rashes or skin changes. No nail dyscrasia. NEURO:  Nonfocal. Well oriented.  Appropriate affect.   LAB RESULTS:  CMP     Component Value Date/Time   NA 140 10/12/2021 1132   K 3.6 10/12/2021 1132   CL 102 10/12/2021 1132   CO2 26 10/12/2021 1132   GLUCOSE 59 (L) 10/12/2021 1132   BUN 18 10/12/2021 1132   CREATININE 1.26 (H) 10/12/2021 1132   CREATININE 1.37 (H) 10/21/2019 1232   CALCIUM 9.9 10/12/2021 1132   PROT 8.3 (H) 10/12/2021 1132   ALBUMIN 3.8 10/12/2021 1132   AST 12 (L) 10/12/2021 1132   AST 14 (L) 10/21/2019 1232   ALT 14 10/12/2021 1132   ALT 25 10/21/2019 1232   ALKPHOS 84 10/12/2021 1132   BILITOT 0.3 10/12/2021 1132   BILITOT <0.2 (L) 10/21/2019 1232   GFRNONAA 49 (L) 10/12/2021 1132   GFRNONAA 43 (L) 10/21/2019 1232   GFRAA >60 10/30/2019 0853   GFRAA 49 (L) 10/21/2019 1232    No results found for: TOTALPROTELP, ALBUMINELP, A1GS, A2GS, BETS, BETA2SER, GAMS, MSPIKE, SPEI  No results found for: KPAFRELGTCHN, LAMBDASER, KAPLAMBRATIO  Lab Results  Component Value Date   WBC 15.8 (H) 10/12/2021   NEUTROABS 10.6 (H) 10/12/2021   HGB 12.4 10/12/2021   HCT 39.7 10/12/2021   MCV 81.4 10/12/2021   PLT 510 (H) 10/12/2021   No results found for: LABCA2  No components found for: FBPZWC585  No results for input(s): INR in the last 168 hours.  No results found for: LABCA2  No results found for: IDP824  No results found for: MPN361  No  results found for: WER154  No results found for: CA2729  No components found for: HGQUANT  No results found for: CEA1 / No results found for: CEA1   No results found for: AFPTUMOR  No results found for:  CHROMOGRNA  No results found for: HGBA, HGBA2QUANT, HGBFQUANT, HGBSQUAN (Hemoglobinopathy evaluation)   No results found for: LDH  No results found for: IRON, TIBC, IRONPCTSAT (Iron and TIBC)  No results found for: FERRITIN  Urinalysis    Component Value Date/Time   COLORURINE YELLOW 12/07/2013 1234   APPEARANCEUR TURBID (A) 12/07/2013 1234   LABSPEC 1.035 (H) 12/07/2013 1234   PHURINE 5.5 12/07/2013 1234   GLUCOSEU >1000 (A) 12/07/2013 1234   HGBUR MODERATE (A) 12/07/2013 1234   BILIRUBINUR NEGATIVE 12/07/2013 1234   KETONESUR NEGATIVE 12/07/2013 1234   PROTEINUR 30 (A) 12/07/2013 1234   UROBILINOGEN 0.2 12/07/2013 1234   NITRITE NEGATIVE 12/07/2013 1234   LEUKOCYTESUR LARGE (A) 12/07/2013 1234    STUDIES: No results found.   ELIGIBLE FOR AVAILABLE RESEARCH PROTOCOL: Not a comment candidate  ASSESSMENT: 60 y.o. 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, estrogen and progesterone receptor positive  (1) genetics testing 11/09/2019 through the [facility] Breast Cancer STAT panel and Common Hereditary Cancers panel found no deleterious mutations in  ATM, BRCA1, BRCA2, CDH1, CHEK2, PALB2, PTEN, STK11 and TP53, APC, ATM, AXIN2, BARD1, BMPR1A, BRCA1, BRCA2, BRIP1, CDH1, CDK4, CDKN2A (p14ARF), CDKN2A (p16INK4a), CHEK2, CTNNA1, DICER1, EPCAM (Deletion/duplication testing only), GREM1 (promoter region deletion/duplication testing only), KIT, MEN1, MLH1, MSH2, MSH3, MSH6, MUTYH, NBN, NF1, NHTL1, PALB2, PDGFRA, PMS2, POLD1, POLE, PTEN, RAD50, RAD51C, RAD51D, RNF43, SDHB, SDHC, SDHD, SMAD4, SMARCA4. STK11, TP53, TSC1, TSC2, and VHL.  The following genes were evaluated for sequence changes only: SDHA and HOXB13  c.251G>A variant only.  (a) two variants of uncertain significance were detected: one in the MSH3 gene called c.1502G>A, and one in the POLD1 gene called c.3290G>A.   (2) status post right lumpectomy and sentinel lymph node sampling 11/04/2019 for ductal carcinoma in situ, 1.1 cm, grade 2, with negative margins.  (a) 3 right axilla sentinel lymph nodes were clear  (3) adjuvant radiation 12/07/2019 through 01/22/2020 The right breast was treated to 50.4 Gy in 28 fractions followed by a 10 Gy boost in 5 fractions to the surgical cavity.   (4) started anastrozole 02/15/2020  (a) bone density  (5) PERSISTENT LEUKOCYTOSIS AND THROMBOCYTOSIS:  (a) peripheral blood nondiagnostic, showing a normocytic anemia and eosinophilia (b) left posterior iliac crest biopsy 08/17/2021 shows a hypercellular marrow with atypical megakaryocytes (occasional large hyperlobated forms, small hypolobated forms, and a few bizarre nuclei); there is no increase in blasts, no monoclonal B-cell or phenotypically aberrant T-cell population; there is adequate storage iron; reticulin stain reveals no significant fibrosis (c) FISH for chromosome 8, 20, 5q-/-5/+5, 7q-/-7 tri, or KMT2A (NAT)(55D32) not detected (d) cytogenetics showed no metaphase cells available for analysis (e) BCR/ABL negative by FISH (09/27/2021)  (f) NGS by [facility] 09/27/2021 showed no relevant mutations in JAK2, MPL or CALR but a 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  This testing was negative, but it did reveal a TP53 mutation associated with a  poor prognosis for myeloid neoplasms.  She met with Dr. Jana Hakim today to discuss this in further detail.    She is to start  Hydroxyurea 548m daily for essential thrombocytosis.  [facility] about her diagnosis and TP53 mutation.    Christina Webster will return on 12/1 for labs and f/u with Dr. Jana Hakim.  She knows to call for any questions that may arise between now and her next appointment.  We are happy to see her sooner if needed.   Total encounter time 20 minutes.Wilber Bihari, NP 10/12/21 6:22 PM Medical Oncology and Hematology [facility] [place], [place] [address] Tel. [phone]    Fax. [phone]   ADDENDUM: I met with Christina Webster today to go over the results of her work-up.  This is summarized in detail above.  Basically the bone marrow strongly suggests a primary megakaryocytic problem and would be consistent with essential thrombocytosis but we have not been able to document the usual mutations leading to this and instead do find a TP53 mutation associated with myeloid neoplasms.  I told Christina Webster our working diagnosis is essential thrombocytosis.  This is generally well controlled with Hydrea.  It does not usually transformed to an acute leukemia though in the long-term there can be problems with fibrosis (was not seen in the bone marrow just obtained).  We discussed Hydrea and she has a good understanding of the possible toxicities side effects and complications.  She will start at 1 tablet daily and we will recheck her counts after a few weeks to make sure there is no excessive suppression of her marrow function  Given the remaining uncertainty and the mutation just noted above I would like to have a second opinion and we are referring her to hematology at [facility] for that.  Hopefully they can see her before she returns to see me December 1  Total encounter time 35 minutes.*   I personally saw this patient and performed a substantive portion of this encounter with the listed APP documented above.   Chauncey Cruel, MD Medical Oncology and Hematology [facility] [address] [place], [place] [address] Tel. [phone]    Fax. [phone]   *Total Encounter Time as defined by the Centers for Medicare and Medicaid Services includes, in addition to the face-to-face time of a patient visit (documented in the note above) non-face-to-face time: obtaining and reviewing outside history, ordering and reviewing medications, tests or procedures, care coordination (communications with other health care professionals or caregivers) and documentation in the medical record.

## 2021-10-12 NOTE — Telephone Encounter (Signed)
Spoke with Karna Christmas [phone]) from [facility] regarding referral made today to [facility] by Wilber Bihari, NP. Referral faxed to [phone]. Receipt confirmed

## 2021-10-12 NOTE — Progress Notes (Signed)
Please see the edited and addended note for 10/12/2021

## 2021-10-13 ENCOUNTER — Encounter: Payer: Self-pay | Admitting: Oncology

## 2021-10-13 ENCOUNTER — Other Ambulatory Visit: Payer: Self-pay

## 2021-10-13 NOTE — Progress Notes (Unsigned)
Adding per patient request.Blair Lundeen Janetta Hora, RN

## 2021-11-16 ENCOUNTER — Inpatient Hospital Stay: Payer: Medicare Other | Attending: Oncology

## 2021-11-16 ENCOUNTER — Inpatient Hospital Stay (HOSPITAL_BASED_OUTPATIENT_CLINIC_OR_DEPARTMENT_OTHER): Payer: Medicare Other | Admitting: Oncology

## 2021-11-16 ENCOUNTER — Other Ambulatory Visit: Payer: Self-pay

## 2021-11-16 VITALS — BP 115/67 | HR 104 | Temp 97.7°F | Resp 18 | Wt 186.1 lb

## 2021-11-16 DIAGNOSIS — D75839 Thrombocytosis, unspecified: Secondary | ICD-10-CM | POA: Diagnosis not present

## 2021-11-16 DIAGNOSIS — M797 Fibromyalgia: Secondary | ICD-10-CM

## 2021-11-16 DIAGNOSIS — Z7982 Long term (current) use of aspirin: Secondary | ICD-10-CM | POA: Diagnosis not present

## 2021-11-16 DIAGNOSIS — D0511 Intraductal carcinoma in situ of right breast: Secondary | ICD-10-CM | POA: Diagnosis not present

## 2021-11-16 DIAGNOSIS — D72829 Elevated white blood cell count, unspecified: Secondary | ICD-10-CM | POA: Insufficient documentation

## 2021-11-16 DIAGNOSIS — G4730 Sleep apnea, unspecified: Secondary | ICD-10-CM

## 2021-11-16 DIAGNOSIS — IMO0002 Reserved for concepts with insufficient information to code with codable children: Secondary | ICD-10-CM

## 2021-11-16 DIAGNOSIS — I10 Essential (primary) hypertension: Secondary | ICD-10-CM

## 2021-11-16 LAB — CBC WITH DIFFERENTIAL/PLATELET
Abs Immature Granulocytes: 0.07 10*3/uL (ref 0.00–0.07)
Basophils Absolute: 0.1 10*3/uL (ref 0.0–0.1)
Basophils Relative: 0 %
Eosinophils Absolute: 0.5 10*3/uL (ref 0.0–0.5)
Eosinophils Relative: 3 %
HCT: 37.9 % (ref 36.0–46.0)
Hemoglobin: 12.1 g/dL (ref 12.0–15.0)
Immature Granulocytes: 0 %
Lymphocytes Relative: 22 %
Lymphs Abs: 4 10*3/uL (ref 0.7–4.0)
MCH: 26.5 pg (ref 26.0–34.0)
MCHC: 31.9 g/dL (ref 30.0–36.0)
MCV: 83.1 fL (ref 80.0–100.0)
Monocytes Absolute: 0.6 10*3/uL (ref 0.1–1.0)
Monocytes Relative: 3 %
Neutro Abs: 12.6 10*3/uL — ABNORMAL HIGH (ref 1.7–7.7)
Neutrophils Relative %: 72 %
Platelets: 536 10*3/uL — ABNORMAL HIGH (ref 150–400)
RBC: 4.56 MIL/uL (ref 3.87–5.11)
RDW: 16 % — ABNORMAL HIGH (ref 11.5–15.5)
WBC: 17.7 10*3/uL — ABNORMAL HIGH (ref 4.0–10.5)
nRBC: 0 % (ref 0.0–0.2)

## 2021-11-16 LAB — COMPREHENSIVE METABOLIC PANEL
ALT: 15 U/L (ref 0–44)
AST: 14 U/L — ABNORMAL LOW (ref 15–41)
Albumin: 3.7 g/dL (ref 3.5–5.0)
Alkaline Phosphatase: 81 U/L (ref 38–126)
Anion gap: 12 (ref 5–15)
BUN: 17 mg/dL (ref 6–20)
CO2: 24 mmol/L (ref 22–32)
Calcium: 9.4 mg/dL (ref 8.9–10.3)
Chloride: 106 mmol/L (ref 98–111)
Creatinine, Ser: 1.17 mg/dL — ABNORMAL HIGH (ref 0.44–1.00)
GFR, Estimated: 54 mL/min — ABNORMAL LOW (ref >60)
Glucose, Bld: 114 mg/dL — ABNORMAL HIGH (ref 70–99)
Potassium: 3.6 mmol/L (ref 3.5–5.1)
Sodium: 142 mmol/L (ref 135–145)
Total Bilirubin: <0.2 mg/dL — ABNORMAL LOW (ref 0.3–1.2)
Total Protein: 7.7 g/dL (ref 6.5–8.1)

## 2021-11-16 NOTE — Progress Notes (Signed)
[name]  Telephone:([phone] Fax:([phone]) [phone]    ID: [name] DOB: 10-26-1961  MR#: [id number]  ZDG#:[id number]  Patient Care Team: Simona Huh, NP as PCP - General (Nurse Practitioner) Mauro Kaufmann, RN as Oncology Nurse Navigator Rockwell Germany, RN as Oncology Nurse Navigator Mansouraty, [name], MD as Consulting Physician (Gastroenterology) Lupe Handley, Virgie Dad, MD as Consulting Physician (Oncology) Kyung Rudd, MD as Consulting Physician (Radiation Oncology) [name], MD as Consulting Physician (General Surgery) Janyth Contes, MD as Referring Physician (Hematology and Oncology) Chauncey Cruel, MD OTHER MD:  CHIEF COMPLAINT: Ductal carcinoma in situ; thrombocytosis  CURRENT TREATMENT: Anastrozole   INTERVAL HISTORY: Christina "Christina Webster" returns today for follow up of her 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  She is accompanied by her daughter Christina Webster  We evaluated Christina Webster for thrombocytosis without a definitive diagnosis established.  We started her on Hydrea and she tolerated that well she said.  We also referred her to Dr. Joan Mayans at [facility] and she was seen there on 10/26/2021 for a second opinion.   The hydrea was discontinued at that time, but she continues on aspirin 81 mg.  She is taking Anastrozole without any unusual side effects.  She does have significant vaginal dryness issues.  She has not been able to find an easy solution for that.  REVIEW OF SYSTEMS: Christina Webster tells me that about 2 weeks ago she felt a lump in her right armpit.  She did not bring it to our attention because she knew she had an appointment with [place] today.  Aside from that a detailed review of systems was stable and in particular there have been no bleeding or bruising problems and no lower extremity asymmetric swelling, sudden shortness of breath or other symptoms suspicious for clots.  A detailed review of systems was stable.  COVID 19  VACCINATION STATUS: [name] x3, most recently 11/2020   HISTORY OF CURRENT ILLNESS:    From the original intake note:  Christina Webster" had routine screening mammography on 09/26/2019 showing a possible abnormality in the right breast. She underwent right diagnostic mammography with tomography at [facility] on 10/01/2019 showing: breast density category C; 1.4 cm linear calcifications in the right breast, upper-outer quadrant.  Accordingly on 10/14/2019 she proceeded to biopsy of the right breast area in question. 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 Prognostic indicators significant for: estrogen receptor, 100% positive with strong staining intensity and progesterone receptor, 0% negative.   The patient's subsequent history is as detailed below.   PAST MEDICAL HISTORY: Past Medical History:  Diagnosis Date   Anemia    Anxiety and depression    Cancer ([place])    right breast can - recent dx   Chronic back pain    Diabetic peripheral neuropathy (HCC)    Family history of breast cancer    Family history of stomach cancer    Fibromyalgia    Hypercholesteremia    Hypertension    Migraines    occasional    Type II diabetes mellitus ([place])     PAST SURGICAL HISTORY: Past Surgical History:  Procedure Laterality Date   BREAST LUMPECTOMY WITH RADIOACTIVE SEED AND SENTINEL LYMPH NODE BIOPSY Right 11/04/2019   Procedure: RIGHT BREAST LUMPECTOMY WITH RADIOACTIVE SEED AND SENTINEL LYMPH NODE MAPPING;  Surgeon: [name], MD;  Location: [facility];  Service: General;  Laterality: Right;   COLONOSCOPY  TUBAL LIGATION  1988    FAMILY HISTORY: Family History  Problem Relation Age of Onset   Hypertension Other    Diabetes Other    Cancer Other    Hyperlipidemia Other    Sleep apnea Other    Breast cancer Mother 76   Diabetes Mother    Hypertension Mother    Other Father        Unsure of  medical history.   Breast cancer Maternal Aunt 63   Breast cancer Cousin 54       maternal cousin   Healthy Half-Sister    Healthy Half-Brother    Healthy Half-Sister    Stomach cancer Maternal Aunt 64  The patient has no information regarding her father or his side of the family.  Patient's mother is 42 years old as of November 2020.  The patient has 1 brother, 2 sisters.  The patient's mother had breast cancer at age 88.  There is also a maternal aunt and 2 maternal cousins with breast cancer.   GYNECOLOGIC HISTORY:  Patient's last menstrual period was 11/24/2013 (lmp unknown). Menarche: 60 years old Age at first live birth: 60 years old [place] P 3 LMP 2014 HRT no  Hysterectomy? no BSO? no   SOCIAL HISTORY: (updated 10/2019)  Christina "Christina Webster" is disabled secondary to fibromyalgia. She is single. She lives at home with her daughters Christina Webster and Christina Webster and 4 grandchildren.  Daughter Christina Webster lives in [place] and currently is unemployed but has worked at a call center.  Daughter Christina Webster works as a Radiation protection practitioner and daughter Christina Webster and works as a Freight forwarder from home.  The patient is not a church attender    ADVANCED DIRECTIVES: Not in place but she intends to name her daughter Christina Webster as healthcare power of attorney.  Christina Webster can be reached at [phone]   HEALTH MAINTENANCE: Social History   Tobacco Use   Smoking status: Never   Smokeless tobacco: Never  Vaping Use   Vaping Use: Never used  Substance Use Topics   Alcohol use: Yes    Comment: -occasional   Drug use: No     Colonoscopy: 08/2018 (Dr. Rush Landmark), repeat 2029  PAP: none on file  Bone density: none on file   No Known Allergies  Current Outpatient Medications  Medication Sig Dispense Refill   anastrozole (ARIMIDEX) 1 MG tablet TAKE 1 TABLET BY MOUTH EVERY DAY 90 tablet 3   aspirin EC 81 MG tablet Take 81 mg by mouth daily.     [employer] MAGNESIUM 500 MG TABS Take 1 tablet by mouth at  bedtime.     cyclobenzaprine (FLEXERIL) 10 MG tablet Take 1 tablet (10 mg total) by mouth 2 (two) times daily as needed for up to 29 doses for muscle spasms. 20 tablet 0   diclofenac sodium (VOLTAREN) 1 % GEL Apply 2 g topically daily as needed (Body).     Diethylpropion HCl CR 75 MG TB24 Take 1 tablet by mouth daily.     DULoxetine (CYMBALTA) 60 MG capsule 60 mg.     DULoxetine (CYMBALTA) 60 MG capsule 60 mg.     ferrous sulfate 325 (65 FE) MG tablet      hydroxyurea (HYDREA) 500 MG capsule Take 1 capsule (500 mg total) by mouth daily. May take with food to minimize GI side effects. 90 capsule 4   Insulin Lispro Prot & Lispro (HUMALOG MIX 75/25 KWIKPEN) (75-25) 100 UNIT/ML Kwikpen Inject 35-50 Units into the skin See  admin instructions. 50 units in the morning and 35 units in the eveing     losartan-hydrochlorothiazide (HYZAAR) 50-12.5 MG tablet Take 1 tablet by mouth daily.   3   Magnesium 200 MG TABS Take 400 mg by mouth at bedtime.      rosuvastatin (CRESTOR) 10 MG tablet Take 10 mg by mouth at bedtime.     SUMAtriptan (IMITREX) 50 MG tablet Take by mouth.     SYNJARDY XR 12.04-999 MG TB24 Take 2 tablets by mouth daily.      topiramate (TOPAMAX) 100 MG tablet Take 100 mg by mouth at bedtime.     traMADol (ULTRAM) 50 MG tablet Take 50 mg by mouth 5 (five) times daily as needed for moderate pain.      TRULICITY 5.78 IO/9.6EX SOPN Inject 0.75 mg into the skin once a week.     No current facility-administered medications for this visit.    OBJECTIVE: African-American woman in no acute distress  Vitals:   11/16/21 1611  BP: 115/67  Pulse: (!) 104  Resp: 18  Temp: 97.7 F (36.5 C)  SpO2: 99%      Body mass index is 36.35 kg/m.   Wt Readings from Last 3 Encounters:  11/16/21 186 lb 1.6 oz (84.4 kg)  10/12/21 188 lb 6.4 oz (85.5 kg)  09/07/21 188 lb 6.4 oz (85.5 kg)     ECOG FS:1 - Symptomatic but completely ambulatory  Sclerae unicteric, EOMs intact Wearing a mask No cervical  or supraclavicular adenopathy Lungs no rales or rhonchi Heart regular rate and rhythm Abd soft, obese, nontender, positive bowel sounds MSK no focal spinal tenderness Neuro: nonfocal, well oriented, positive affect Breasts: The right breast is status postlumpectomy and radiation.  I do not palpate a lymph node in the right axilla but just beneath the right axilla in the soft tissue lateral to the breast, likely the tail of Spence, there is induration that appears abnormal to me and which I do not recall palpating previously.  The left breast and left axilla are benign.  LAB RESULTS:  CMP     Component Value Date/Time   NA 142 11/16/2021 1543   K 3.6 11/16/2021 1543   CL 106 11/16/2021 1543   CO2 24 11/16/2021 1543   GLUCOSE 114 (H) 11/16/2021 1543   BUN 17 11/16/2021 1543   CREATININE 1.17 (H) 11/16/2021 1543   CREATININE 1.37 (H) 10/21/2019 1232   CALCIUM 9.4 11/16/2021 1543   PROT 7.7 11/16/2021 1543   ALBUMIN 3.7 11/16/2021 1543   AST 14 (L) 11/16/2021 1543   AST 14 (L) 10/21/2019 1232   ALT 15 11/16/2021 1543   ALT 25 10/21/2019 1232   ALKPHOS 81 11/16/2021 1543   BILITOT <0.2 (L) 11/16/2021 1543   BILITOT <0.2 (L) 10/21/2019 1232   GFRNONAA 54 (L) 11/16/2021 1543   GFRNONAA 43 (L) 10/21/2019 1232   GFRAA >60 10/30/2019 0853   GFRAA 49 (L) 10/21/2019 1232    No results found for: TOTALPROTELP, ALBUMINELP, A1GS, A2GS, BETS, BETA2SER, GAMS, MSPIKE, SPEI  No results found for: KPAFRELGTCHN, LAMBDASER, KAPLAMBRATIO  Lab Results  Component Value Date   WBC 17.7 (H) 11/16/2021   NEUTROABS 12.6 (H) 11/16/2021   HGB 12.1 11/16/2021   HCT 37.9 11/16/2021   MCV 83.1 11/16/2021   PLT 536 (H) 11/16/2021   No results found for: LABCA2  No components found for: [id number]  No results for input(s): INR in the last 168 hours.  No results found for:  LABCA2  No results found for: KGY185  No results found for: UDJ497  No results found for: WYO378  No results found for:  CA2729  No components found for: HGQUANT  No results found for: CEA1 / No results found for: CEA1   No results found for: AFPTUMOR  No results found for: CHROMOGRNA  No results found for: HGBA, HGBA2QUANT, HGBFQUANT, HGBSQUAN (Hemoglobinopathy evaluation)   No results found for: LDH  No results found for: IRON, TIBC, IRONPCTSAT (Iron and TIBC)  No results found for: FERRITIN  Urinalysis    Component Value Date/Time   COLORURINE YELLOW 12/07/2013 1234   APPEARANCEUR TURBID (A) 12/07/2013 1234   LABSPEC 1.035 (H) 12/07/2013 1234   PHURINE 5.5 12/07/2013 1234   GLUCOSEU >1000 (A) 12/07/2013 1234   HGBUR MODERATE (A) 12/07/2013 1234   BILIRUBINUR NEGATIVE 12/07/2013 1234   KETONESUR NEGATIVE 12/07/2013 1234   PROTEINUR 30 (A) 12/07/2013 1234   UROBILINOGEN 0.2 12/07/2013 1234   NITRITE NEGATIVE 12/07/2013 1234   LEUKOCYTESUR LARGE (A) 12/07/2013 1234    STUDIES: No results found.   ELIGIBLE FOR AVAILABLE RESEARCH PROTOCOL: Not a comment candidate  ASSESSMENT: 60 y.o. 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, estrogen and progesterone receptor positive  (1) genetics testing 11/09/2019 through the [facility] Breast Cancer STAT panel and Common Hereditary Cancers panel found no deleterious mutations in  ATM, BRCA1, BRCA2, CDH1, CHEK2, PALB2, PTEN, STK11 and TP53, APC, ATM, AXIN2, BARD1, BMPR1A, BRCA1, BRCA2, BRIP1, CDH1, CDK4, CDKN2A (p14ARF), CDKN2A (p16INK4a), CHEK2, CTNNA1, DICER1, EPCAM (Deletion/duplication testing only), GREM1 (promoter region deletion/duplication testing only), KIT, MEN1, MLH1, MSH2, MSH3, MSH6, MUTYH, NBN, NF1, NHTL1, PALB2, PDGFRA, PMS2, POLD1, POLE, PTEN, RAD50, RAD51C, RAD51D, RNF43, SDHB, SDHC, SDHD, SMAD4, SMARCA4. STK11, TP53, TSC1, TSC2, and VHL.  The following genes were evaluated for sequence changes only: SDHA and HOXB13 c.251G>A variant only.  (a) two variants of  uncertain significance were detected: one in the MSH3 gene called c.1502G>A, and one in the POLD1 gene called c.3290G>A.   (2) status post right lumpectomy and sentinel lymph node sampling 11/04/2019 for ductal carcinoma in situ, 1.1 cm, grade 2, with negative margins.  (a) 3 right axilla sentinel lymph nodes were clear  (3) adjuvant radiation 12/07/2019 through 01/22/2020 The right breast was treated to 50.4 Gy in 28 fractions followed by a 10 Gy boost in 5 fractions to the surgical cavity.   (4) started anastrozole 02/15/2020  (a) bone density  (5) PERSISTENT LEUKOCYTOSIS AND THROMBOCYTOSIS:  (a) peripheral blood nondiagnostic, showing a normocytic anemia and eosinophilia (b) left posterior iliac crest biopsy 08/17/2021 shows a hypercellular marrow with atypical megakaryocytes (occasional large hyperlobated forms, small hypolobated forms, and a few bizarre nuclei); there is no increase in blasts, no monoclonal B-cell or phenotypically aberrant T-cell population; there is adequate storage iron; reticulin stain reveals no significant fibrosis (c) FISH for chromosome 8, 20, 5q-/-5/+5, 7q-/-7 tri, or KMT2A (HYI)(50Y77) not detected (d) cytogenetics showed no metaphase cells available for analysis (e) BCR/ABL negative by FISH (09/27/2021)  (f) NGS by [facility] 09/27/2021 showed no relevant mutations in JAK2, MPL or CALR but a 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 Joan Mayans); continues on ASA 81 mg daily   PLAN: "Christina Webster" had a very favorable report of her experience  at [facility] and is already scheduled to see Dr. Joan Mayans again on 02/22/2022.  Accordingly I will defer further work-up of her thrombocytosis and leukocytosis to her discretion  I am adding a right breast and axillary  ultrasound to the upcoming bilateral mammography already scheduled at [facility].  If there is indeed a mass in the area in question it will be biopsied and depending on those results we may or not need to change her treatment.  Assuming there is no mass or biopsy is benign then she will return to see [place] in September, and likely will continue yearly follow-up from that point  She knows to call for any other issue that may develop before the next visit  Total encounter time 25 minutes.Sarajane Jews C. Mirenda Baltazar, MD 11/16/21 5:55 PM Medical Oncology and Hematology [facility] [place], [place] [address] Tel. [phone]    Fax. [phone]   I, Wilburn Mylar, am acting as scribe for Dr. Virgie Dad. Denishia Citro.  I, Lurline Del MD, have reviewed the above documentation for accuracy and completeness, and I agree with the above.    *Total Encounter Time as defined by the Centers for Medicare and Medicaid Services includes, in addition to the face-to-face time of a patient visit (documented in the note above) non-face-to-face time: obtaining and reviewing outside history, ordering and reviewing medications, tests or procedures, care coordination (communications with other health care professionals or caregivers) and documentation in the medical record.

## 2021-12-12 IMAGING — CR DG LUMBAR SPINE COMPLETE 4+V
5 series · 5 of 5 positions shown · non-contrast
Comparison: None.

CLINICAL DATA: Pt c/o right sided shoulder pain, right hip pain,
and right low back pain s/p reportedly tripping/falling outside
[HOSPITAL] today when she arrived for treatment. Pt is being
treated for breast cancer.pain

EXAM:
LUMBAR SPINE - COMPLETE 4+ VIEW

[t lumbar spine ap]
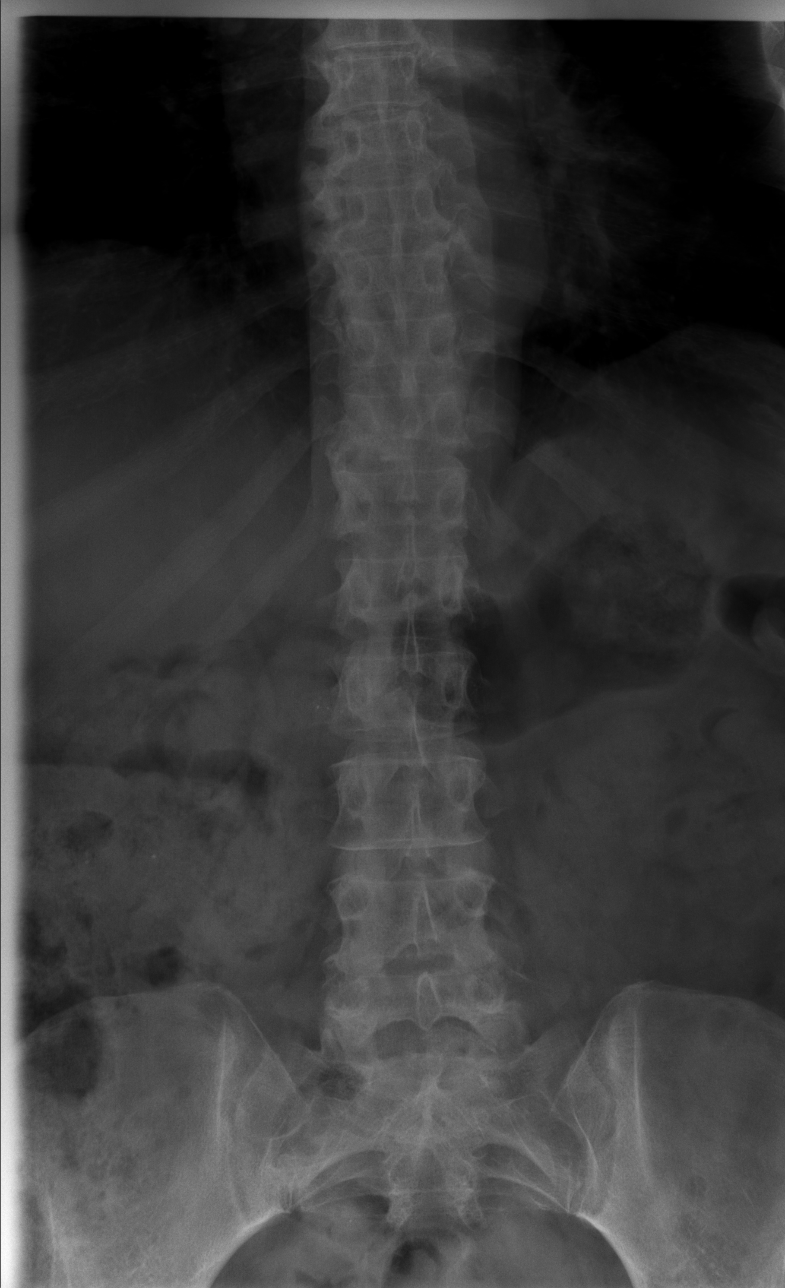

[t lumbar spine obl (1 of 2)]
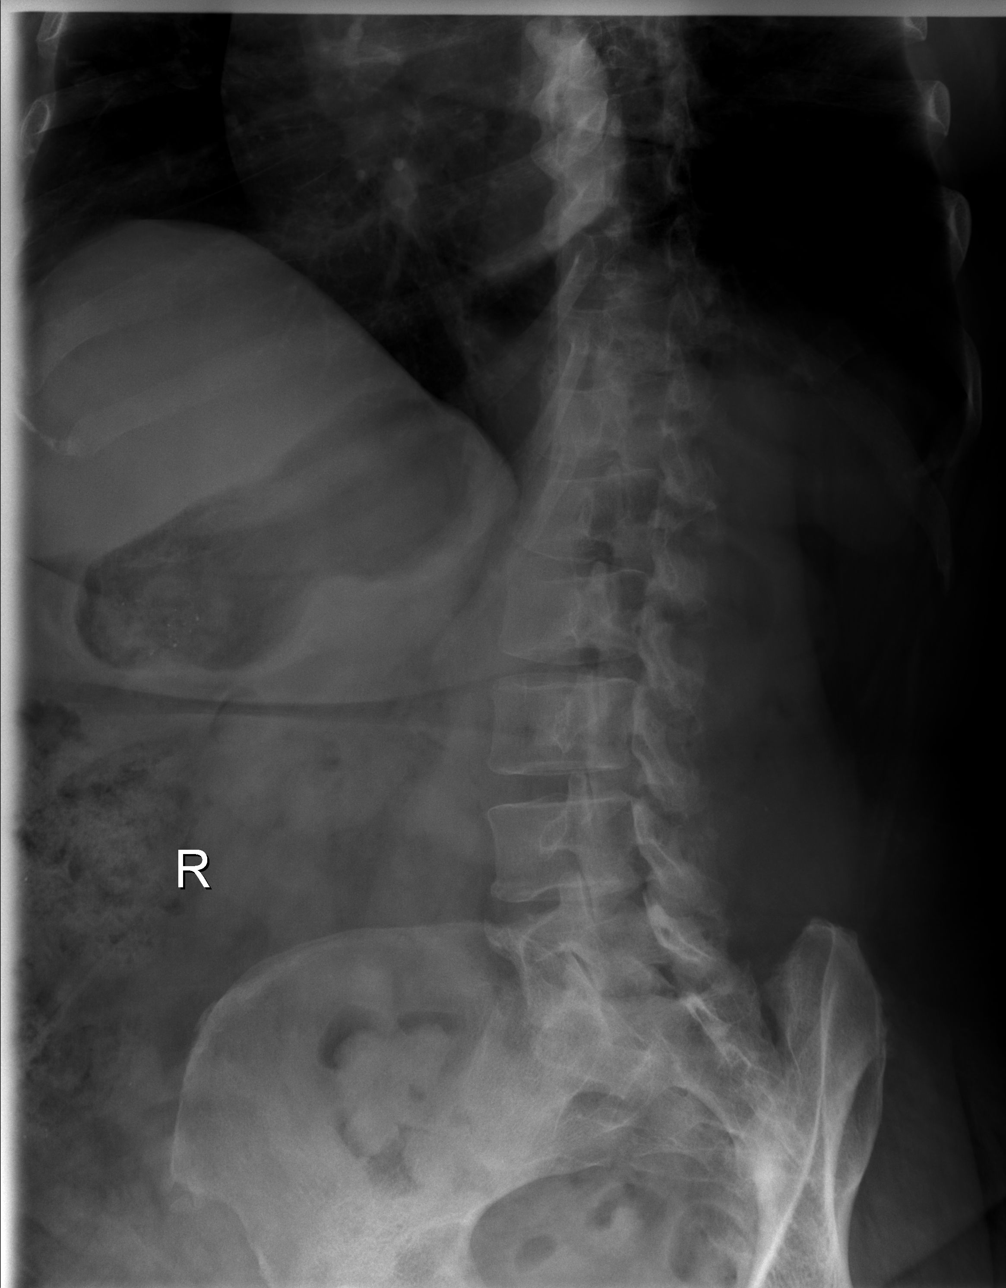

[t lumbar spine obl (2 of 2)]
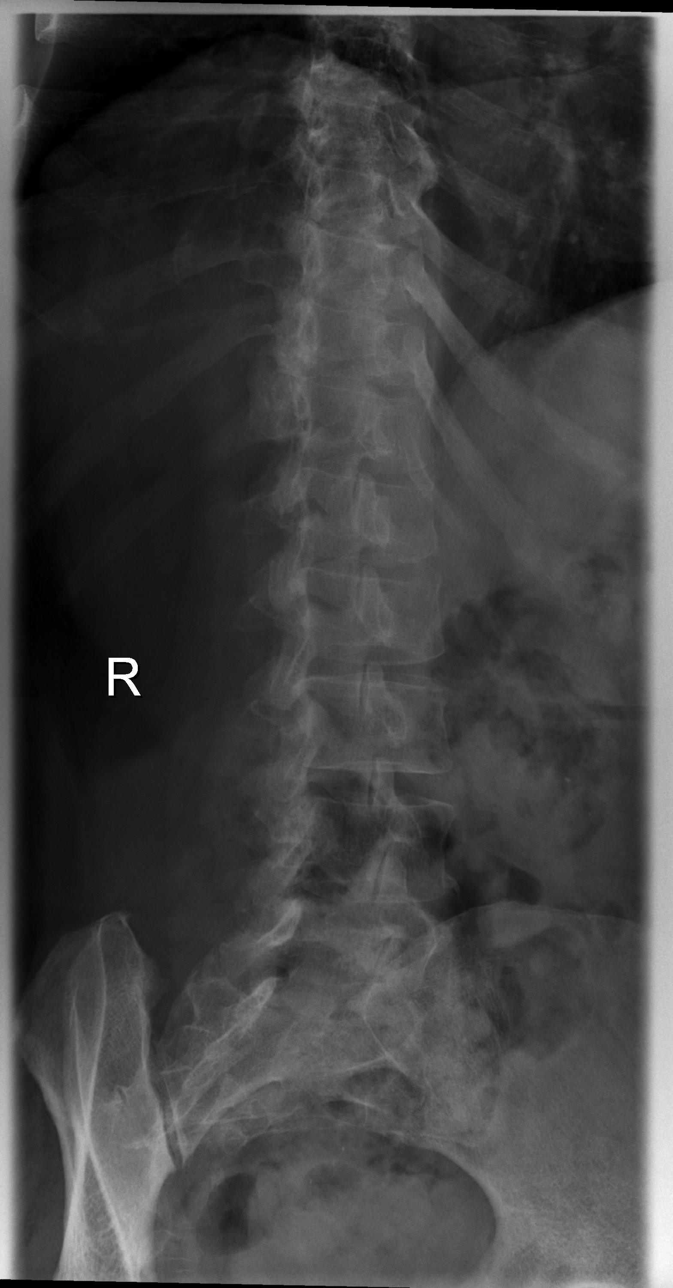

[t lumbar spine lat]
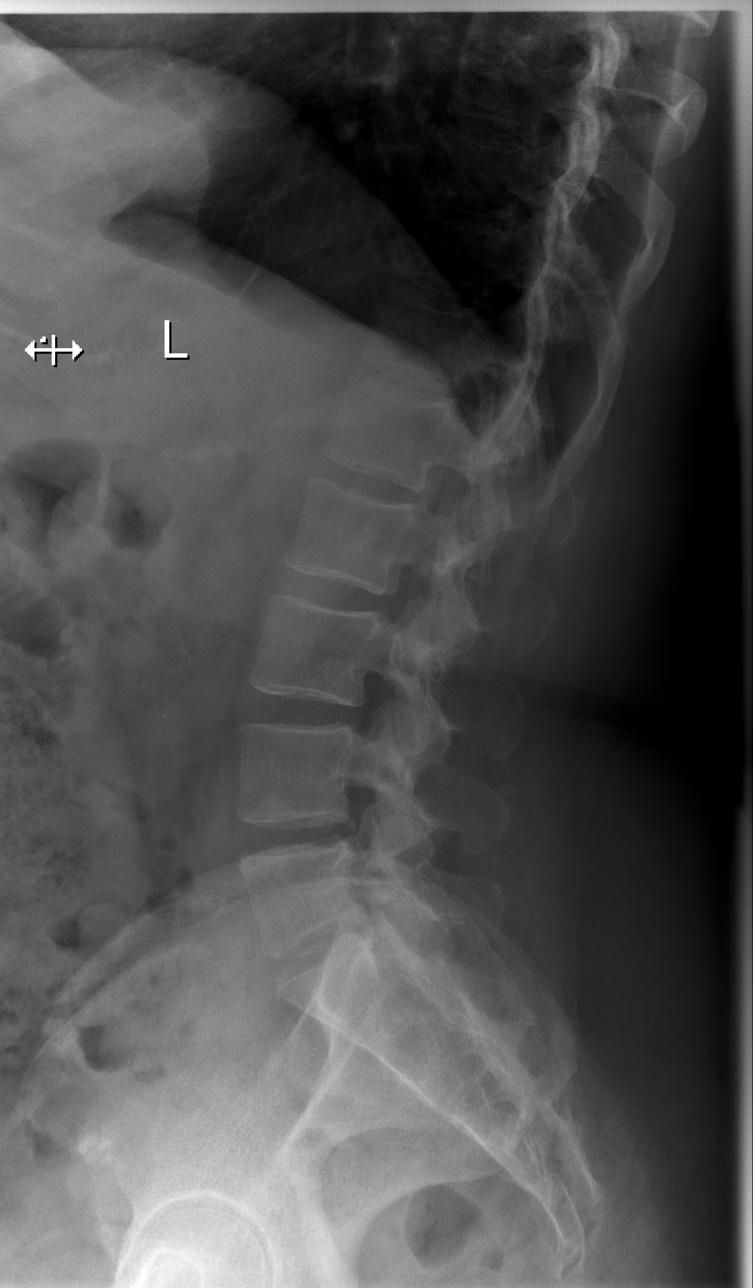

[t lumbar l-5 s-1 spot]
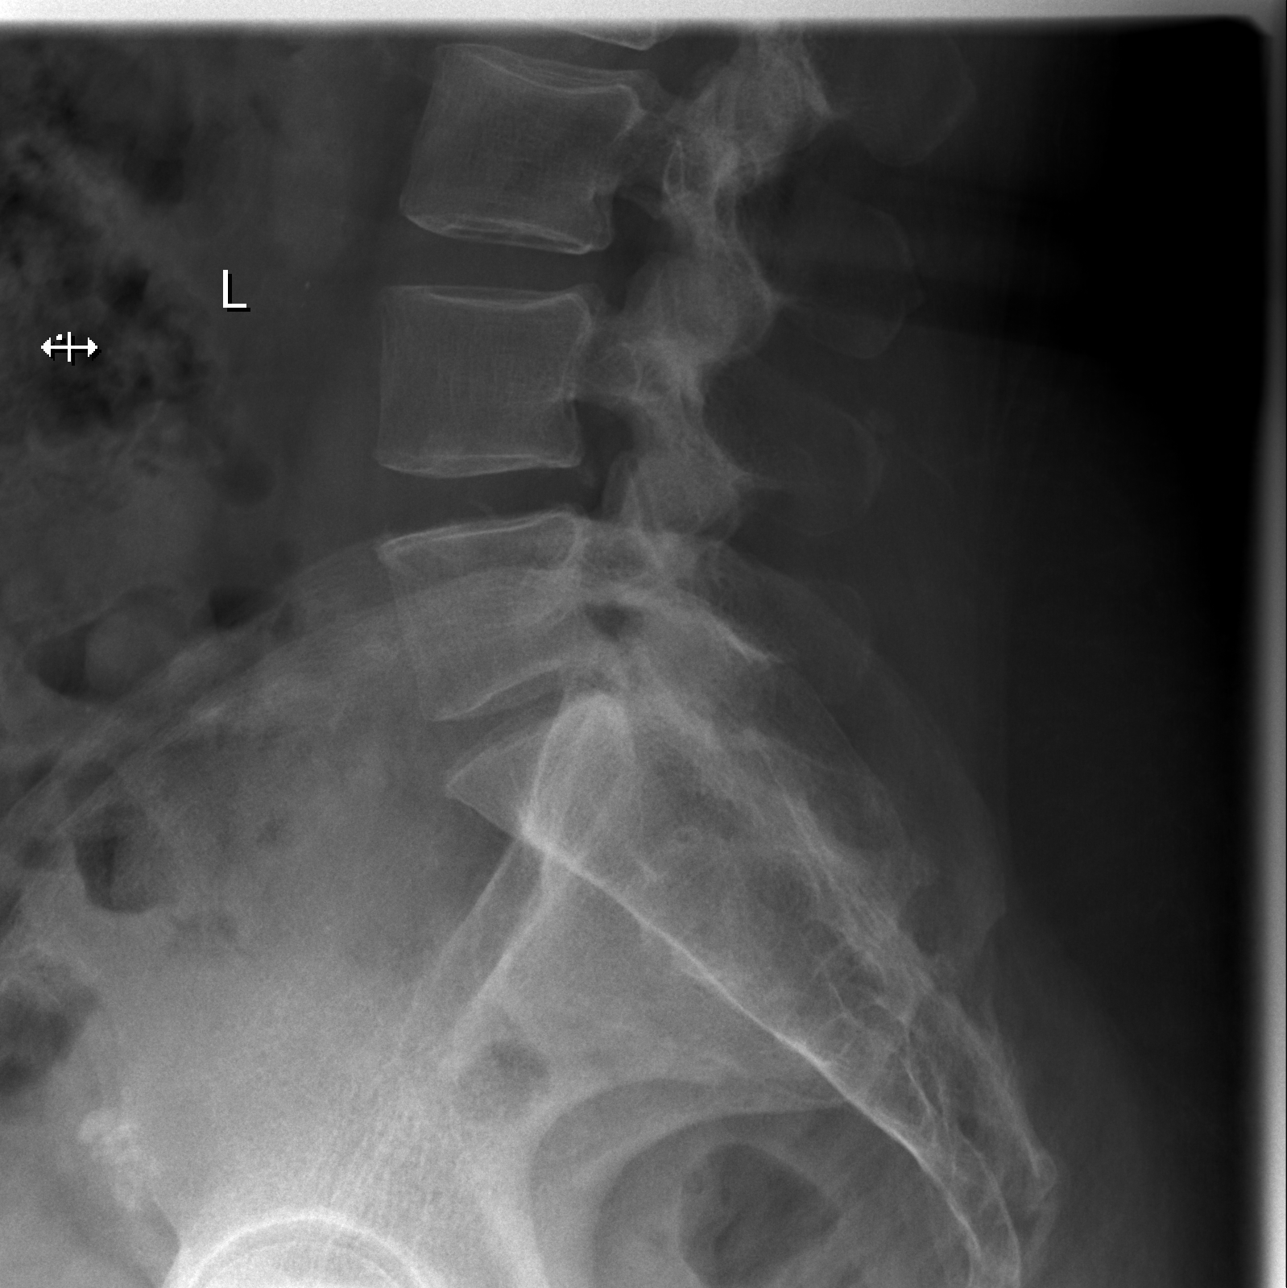

[5 of 5 positions shown; findings below may reference images not displayed]

FINDINGS: Normal alignment of lumbar vertebral bodies. No loss of vertebral
body height or disc height. No pars fracture. No subluxation.
IMPRESSION: No acute findings lumbar spine.

## 2021-12-22 ENCOUNTER — Ambulatory Visit: Payer: Medicare Other | Admitting: Podiatry

## 2022-09-05 ENCOUNTER — Telehealth: Payer: Self-pay | Admitting: Hematology and Oncology

## 2022-09-05 ENCOUNTER — Other Ambulatory Visit: Payer: Self-pay | Admitting: *Deleted

## 2022-09-05 NOTE — Telephone Encounter (Signed)
Per 9/20 phone line pt called to r/s appointment

## 2022-09-06 ENCOUNTER — Inpatient Hospital Stay: Payer: Medicare Other | Admitting: Hematology and Oncology

## 2022-09-06 ENCOUNTER — Inpatient Hospital Stay: Payer: Medicare Other

## 2022-09-27 ENCOUNTER — Other Ambulatory Visit: Payer: Self-pay | Admitting: *Deleted

## 2022-09-27 DIAGNOSIS — D0511 Intraductal carcinoma in situ of right breast: Secondary | ICD-10-CM

## 2022-09-28 ENCOUNTER — Encounter: Payer: Self-pay | Admitting: Hematology and Oncology

## 2022-09-28 ENCOUNTER — Other Ambulatory Visit: Payer: Self-pay

## 2022-09-28 ENCOUNTER — Inpatient Hospital Stay: Payer: Medicare Other | Attending: Hematology and Oncology

## 2022-09-28 ENCOUNTER — Inpatient Hospital Stay (HOSPITAL_BASED_OUTPATIENT_CLINIC_OR_DEPARTMENT_OTHER): Payer: Medicare Other | Admitting: Hematology and Oncology

## 2022-09-28 VITALS — BP 113/69 | HR 93 | Temp 97.6°F | Resp 18 | Wt 171.9 lb

## 2022-09-28 DIAGNOSIS — Z79811 Long term (current) use of aromatase inhibitors: Secondary | ICD-10-CM | POA: Insufficient documentation

## 2022-09-28 DIAGNOSIS — D75839 Thrombocytosis, unspecified: Secondary | ICD-10-CM | POA: Insufficient documentation

## 2022-09-28 DIAGNOSIS — D72829 Elevated white blood cell count, unspecified: Secondary | ICD-10-CM | POA: Insufficient documentation

## 2022-09-28 DIAGNOSIS — Z79899 Other long term (current) drug therapy: Secondary | ICD-10-CM | POA: Diagnosis not present

## 2022-09-28 DIAGNOSIS — D0511 Intraductal carcinoma in situ of right breast: Secondary | ICD-10-CM | POA: Insufficient documentation

## 2022-09-28 LAB — CBC WITH DIFFERENTIAL (CANCER CENTER ONLY)
Abs Immature Granulocytes: 0.03 10*3/uL (ref 0.00–0.07)
Basophils Absolute: 0.1 10*3/uL (ref 0.0–0.1)
Basophils Relative: 1 %
Eosinophils Absolute: 0.3 10*3/uL (ref 0.0–0.5)
Eosinophils Relative: 3 %
HCT: 38.5 % (ref 36.0–46.0)
Hemoglobin: 12.8 g/dL (ref 12.0–15.0)
Immature Granulocytes: 0 %
Lymphocytes Relative: 22 %
Lymphs Abs: 2.3 10*3/uL (ref 0.7–4.0)
MCH: 32 pg (ref 26.0–34.0)
MCHC: 33.2 g/dL (ref 30.0–36.0)
MCV: 96.3 fL (ref 80.0–100.0)
Monocytes Absolute: 0.5 10*3/uL (ref 0.1–1.0)
Monocytes Relative: 4 %
Neutro Abs: 7.5 10*3/uL (ref 1.7–7.7)
Neutrophils Relative %: 70 %
Platelet Count: 474 10*3/uL — ABNORMAL HIGH (ref 150–400)
RBC: 4 MIL/uL (ref 3.87–5.11)
RDW: 13.7 % (ref 11.5–15.5)
WBC Count: 10.7 10*3/uL — ABNORMAL HIGH (ref 4.0–10.5)
nRBC: 0 % (ref 0.0–0.2)

## 2022-09-28 LAB — CMP (CANCER CENTER ONLY)
ALT: 7 U/L (ref 0–44)
AST: 9 U/L — ABNORMAL LOW (ref 15–41)
Albumin: 4.1 g/dL (ref 3.5–5.0)
Alkaline Phosphatase: 65 U/L (ref 38–126)
Anion gap: 7 (ref 5–15)
BUN: 17 mg/dL (ref 6–20)
CO2: 28 mmol/L (ref 22–32)
Calcium: 9.4 mg/dL (ref 8.9–10.3)
Chloride: 104 mmol/L (ref 98–111)
Creatinine: 0.98 mg/dL (ref 0.44–1.00)
GFR, Estimated: >60 mL/min (ref >60)
Glucose, Bld: 103 mg/dL — ABNORMAL HIGH (ref 70–99)
Potassium: 3.5 mmol/L (ref 3.5–5.1)
Sodium: 139 mmol/L (ref 135–145)
Total Bilirubin: 0.4 mg/dL (ref 0.3–1.2)
Total Protein: 7.7 g/dL (ref 6.5–8.1)

## 2022-09-28 NOTE — Progress Notes (Signed)
[place]  [phone]) [phone] Fax:(336) [phone]    ID: Christina Webster DOB: [birth year]  MR#: [id number]  PTW#:[id number]  Patient Care Team: Simona Huh, NP as PCP - General (Nurse Practitioner) Mauro Kaufmann, RN as Oncology Nurse Navigator Rockwell Germany, RN as Oncology Nurse Navigator Mansouraty, [name], MD as Consulting Physician (Gastroenterology) Kyung Rudd, MD as Consulting Physician (Radiation Oncology) [name], MD as Consulting Physician (General Surgery) Janyth Contes, MD as Referring Physician (Hematology and Oncology) Benay Pike, MD as Medical Oncologist (Hematology and Oncology) Benay Pike, MD OTHER MD:  CHIEF COMPLAINT: Ductal carcinoma in situ; thrombocytosis  CURRENT TREATMENT: Anastrozole   INTERVAL HISTORY:  [name] returns today for follow up. Since last visit, she had to deal with the death of multiple family members and is now slowly recovering.  She has been compliant with anastrozole.  She had her last mammogram in [place] and this was unremarkable.  She denies any breast changes.  She continues on baby aspirin.  She was on Hydrea before but this was discontinued.  She has not had a chance to go back to see Dr. Mortimer Fries yet.  Rest of the pertinent 10 point ROS reviewed and negative.  COVID 19 VACCINATION STATUS: [place] x3, most recently 11/2020   HISTORY OF CURRENT ILLNESS:    From the original intake note:  Christina Webster" had routine screening mammography on 09/26/2019 showing a possible abnormality in the right breast. She underwent right diagnostic mammography with tomography at [facility] on 10/01/2019 showing: breast density category C; 1.4 cm linear calcifications in the right breast, upper-outer quadrant.  Accordingly on 10/14/2019 she proceeded to biopsy of the right breast area in question. 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 Prognostic indicators significant for: estrogen receptor, 100% positive with strong staining intensity and progesterone receptor, 0% negative.   The patient's subsequent history is as detailed below.   PAST MEDICAL HISTORY: Past Medical History:  Diagnosis Date   Anemia    Anxiety and depression    Cancer ([place])    right breast can - recent dx   Chronic back pain    Diabetic peripheral neuropathy (HCC)    Family history of breast cancer    Family history of stomach cancer    Fibromyalgia    Hypercholesteremia    Hypertension    Migraines    occasional    Type II diabetes mellitus ([place])     PAST SURGICAL HISTORY: Past Surgical History:  Procedure Laterality Date   BREAST LUMPECTOMY WITH RADIOACTIVE SEED AND SENTINEL LYMPH NODE BIOPSY Right 11/04/2019   Procedure: RIGHT BREAST LUMPECTOMY WITH RADIOACTIVE SEED AND SENTINEL LYMPH NODE MAPPING;  Surgeon: [name], MD;  Location: [facility];  Service: General;  Laterality: Right;   COLONOSCOPY     TUBAL LIGATION  1988    FAMILY HISTORY: Family History  Problem Relation Age of Onset   Hypertension Other    Diabetes Other    Cancer Other    Hyperlipidemia Other    Sleep apnea Other    Breast cancer Mother 72   Diabetes Mother    Hypertension Mother    Other Father        Unsure of medical history.   Breast cancer Maternal Aunt 16   Breast cancer Cousin 60       maternal cousin   Healthy Half-Sister    Healthy Half-Brother  Healthy Half-Sister    Stomach cancer Maternal Aunt 64  The patient has no information regarding her father or his side of the family.  Patient's mother is 11 years old as of November 2020.  The patient has 1 brother, 2 sisters.  The patient's mother had breast cancer at age 32.  There is also a maternal aunt and 2 maternal cousins with breast cancer.   GYNECOLOGIC HISTORY:  Patient's last menstrual period was 11/24/2013 (lmp unknown). Menarche: 61 years old Age at  first live birth: 61 years old [place] P 3 LMP 2014 HRT no  Hysterectomy? no BSO? no   SOCIAL HISTORY: (updated 10/2019)  [name] is disabled secondary to fibromyalgia. She is single. She lives at home with her daughters [name] and [name] and 4 grandchildren.  Daughter [name] lives in [place] and currently is unemployed but has worked at a call center.  Daughter [name] works as a Radiation protection practitioner and daughter [name] also work lives in [place] and works as a Freight forwarder from home.  The patient is not a church attender    ADVANCED DIRECTIVES: Not in place but she intends to name her daughter [name] as healthcare power of attorney.  [name] can be reached at [phone]   HEALTH MAINTENANCE: Social History   Tobacco Use   Smoking status: Never   Smokeless tobacco: Never  Vaping Use   Vaping Use: Never used  Substance Use Topics   Alcohol use: Yes    Comment: -occasional   Drug use: No     Colonoscopy: 08/2018 (Dr. Rush Landmark), repeat 2029  PAP: none on file  Bone density: none on file   No Known Allergies  Current Outpatient Medications  Medication Sig Dispense Refill   anastrozole (ARIMIDEX) 1 MG tablet TAKE 1 TABLET BY MOUTH EVERY DAY 90 tablet 3   aspirin EC 81 MG tablet Take 81 mg by mouth daily.     [facility] MAGNESIUM 500 MG TABS Take 1 tablet by mouth at bedtime.     cyclobenzaprine (FLEXERIL) 10 MG tablet Take 1 tablet (10 mg total) by mouth 2 (two) times daily as needed for up to 29 doses for muscle spasms. 20 tablet 0   diclofenac sodium (VOLTAREN) 1 % GEL Apply 2 g topically daily as needed (Body).     Diethylpropion HCl CR 75 MG TB24 Take 1 tablet by mouth daily.     DULoxetine (CYMBALTA) 60 MG capsule 60 mg.     DULoxetine (CYMBALTA) 60 MG capsule 60 mg.     ferrous sulfate 325 (65 FE) MG tablet      hydroxyurea (HYDREA) 500 MG capsule Take 1 capsule (500 mg total) by mouth daily. May take with food to minimize GI side effects. 90 capsule 4   Insulin Lispro Prot &  Lispro (HUMALOG MIX 75/25 KWIKPEN) (75-25) 100 UNIT/ML Kwikpen Inject 35-50 Units into the skin See admin instructions. 50 units in the morning and 35 units in the eveing     losartan-hydrochlorothiazide (HYZAAR) 50-12.5 MG tablet Take 1 tablet by mouth daily.   3   Magnesium 200 MG TABS Take 400 mg by mouth at bedtime.      rosuvastatin (CRESTOR) 10 MG tablet Take 10 mg by mouth at bedtime.     SUMAtriptan (IMITREX) 50 MG tablet Take by mouth.     SYNJARDY XR 12.04-999 MG TB24 Take 2 tablets by mouth daily.      topiramate (TOPAMAX) 100 MG tablet Take 100 mg by mouth at bedtime.  traMADol (ULTRAM) 50 MG tablet Take 50 mg by mouth 5 (five) times daily as needed for moderate pain.      TRULICITY 9.32 TF/5.7DU SOPN Inject 0.75 mg into the skin once a week.     No current facility-administered medications for this visit.    OBJECTIVE: African-American woman in no acute distress  Vitals:   09/28/22 0824  BP: 113/69  Pulse: 93  Resp: 18  Temp: 97.6 F (36.4 C)  SpO2: 98%      Body mass index is 33.57 kg/m.   Wt Readings from Last 3 Encounters:  09/28/22 171 lb 14.4 oz (78 kg)  11/16/21 186 lb 1.6 oz (84.4 kg)  10/12/21 188 lb 6.4 oz (85.5 kg)     ECOG FS:1 - Symptomatic but completely ambulatory  Physical Exam Constitutional:      Appearance: Normal appearance.  Chest:     Comments: Bilateral breasts inspected.  Right breast status postlumpectomy.  No palpable concerns.  No regional adenopathy.  Left breast normal to inspection and palpation. Musculoskeletal:     Cervical back: Normal range of motion and neck supple. No rigidity.  Lymphadenopathy:     Cervical: No cervical adenopathy.  Neurological:     Mental Status: She is alert.      LAB RESULTS:  CMP     Component Value Date/Time   NA 142 11/16/2021 1543   K 3.6 11/16/2021 1543   CL 106 11/16/2021 1543   CO2 24 11/16/2021 1543   GLUCOSE 114 (H) 11/16/2021 1543   BUN 17 11/16/2021 1543   CREATININE 1.17 (H)  11/16/2021 1543   CREATININE 1.37 (H) 10/21/2019 1232   CALCIUM 9.4 11/16/2021 1543   PROT 7.7 11/16/2021 1543   ALBUMIN 3.7 11/16/2021 1543   AST 14 (L) 11/16/2021 1543   AST 14 (L) 10/21/2019 1232   ALT 15 11/16/2021 1543   ALT 25 10/21/2019 1232   ALKPHOS 81 11/16/2021 1543   BILITOT <0.2 (L) 11/16/2021 1543   BILITOT <0.2 (L) 10/21/2019 1232   GFRNONAA 54 (L) 11/16/2021 1543   GFRNONAA 43 (L) 10/21/2019 1232   GFRAA >60 10/30/2019 0853   GFRAA 49 (L) 10/21/2019 1232    No results found for: "TOTALPROTELP", "ALBUMINELP", "A1GS", "A2GS", "BETS", "BETA2SER", "GAMS", "MSPIKE", "SPEI"  No results found for: "KPAFRELGTCHN", "LAMBDASER", "KAPLAMBRATIO"  Lab Results  Component Value Date   WBC 10.7 (H) 09/28/2022   NEUTROABS 7.5 09/28/2022   HGB 12.8 09/28/2022   HCT 38.5 09/28/2022   MCV 96.3 09/28/2022   PLT 474 (H) 09/28/2022   No results found for: "LABCA2"  No components found for: "KGURKY706"  No results for input(s): "INR" in the last 168 hours.  No results found for: "LABCA2"  No results found for: "CBJ628"  No results found for: "CAN125"  No results found for: "CAN153"  No results found for: "CA2729"  No components found for: "HGQUANT"  No results found for: "CEA1", "CEA" / No results found for: "CEA1", "CEA"   No results found for: "AFPTUMOR"  No results found for: "CHROMOGRNA"  No results found for: "HGBA", "HGBA2QUANT", "HGBFQUANT", "HGBSQUAN" (Hemoglobinopathy evaluation)   No results found for: "LDH"  No results found for: "IRON", "TIBC", "IRONPCTSAT" (Iron and TIBC)  No results found for: "FERRITIN"  Urinalysis    Component Value Date/Time   COLORURINE YELLOW 12/07/2013 1234   APPEARANCEUR TURBID (A) 12/07/2013 1234   LABSPEC 1.035 (H) 12/07/2013 1234   PHURINE 5.5 12/07/2013 1234   GLUCOSEU >1000 (A) 12/07/2013  [place] (A) 12/07/2013 1234   BILIRUBINUR NEGATIVE 12/07/2013 1234   KETONESUR NEGATIVE 12/07/2013 1234    PROTEINUR 30 (A) 12/07/2013 1234   UROBILINOGEN 0.2 12/07/2013 1234   NITRITE NEGATIVE 12/07/2013 1234   LEUKOCYTESUR LARGE (A) 12/07/2013 1234    STUDIES: No results found.   ELIGIBLE FOR AVAILABLE RESEARCH PROTOCOL: Not a comment candidate  ASSESSMENT: 61 y.o. 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, estrogen and progesterone receptor positive  (1) genetics testing 11/09/2019 through the [facility] Breast Cancer STAT panel and Common Hereditary Cancers panel found no deleterious mutations in  ATM, BRCA1, BRCA2, CDH1, CHEK2, PALB2, PTEN, STK11 and TP53, APC, ATM, AXIN2, BARD1, BMPR1A, BRCA1, BRCA2, BRIP1, CDH1, CDK4, CDKN2A (p14ARF), CDKN2A (p16INK4a), CHEK2, CTNNA1, DICER1, EPCAM (Deletion/duplication testing only), GREM1 (promoter region deletion/duplication testing only), KIT, MEN1, MLH1, MSH2, MSH3, MSH6, MUTYH, NBN, NF1, NHTL1, PALB2, PDGFRA, PMS2, POLD1, POLE, PTEN, RAD50, RAD51C, RAD51D, RNF43, SDHB, SDHC, SDHD, SMAD4, SMARCA4. STK11, TP53, TSC1, TSC2, and VHL.  The following genes were evaluated for sequence changes only: SDHA and HOXB13 c.251G>A variant only.  (a) two variants of uncertain significance were detected: one in the MSH3 gene called c.1502G>A, and one in the POLD1 gene called c.3290G>A.   (2) status post right lumpectomy and sentinel lymph node sampling 11/04/2019 for ductal carcinoma in situ, 1.1 cm, grade 2, with negative margins.  (a) 3 right axilla sentinel lymph nodes were clear  (3) adjuvant radiation 12/07/2019 through 01/22/2020 The right breast was treated to 50.4 Gy in 28 fractions followed by a 10 Gy boost in 5 fractions to the surgical cavity.   (4) started anastrozole 02/15/2020  (a) bone density  (5) PERSISTENT LEUKOCYTOSIS AND THROMBOCYTOSIS:  (a) peripheral blood nondiagnostic, showing a normocytic anemia and eosinophilia (b) left posterior iliac crest biopsy 08/17/2021 shows a  hypercellular marrow with atypical megakaryocytes (occasional large hyperlobated forms, small hypolobated forms, and a few bizarre nuclei); there is no increase in blasts, no monoclonal B-cell or phenotypically aberrant T-cell population; there is adequate storage iron; reticulin stain reveals no significant fibrosis (c) FISH for chromosome 8, 20, 5q-/-5/+5, 7q-/-7 tri, or KMT2A (YEM)(33K12) not detected (d) cytogenetics showed no metaphase cells available for analysis (e) BCR/ABL negative by FISH (09/27/2021)  (f) NGS by [facility] 09/27/2021 showed no relevant mutations in JAK2, MPL or CALR but a 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 Joan Mayans); continues on ASA 81 mg daily   PLAN: Ms. [name] is here for follow-up.  She has been compliant with anastrozole.  She denies any new health complaints.  She had her last breast [facility] at [facility] which was unremarkable. Physical examination today without any complaints. I have encouraged her to continue aspirin as recommended and also follow-up with Dr. Mortimer Fries.  She was instructed to call them and set up follow-up appointment.  We have reviewed her CBC which actually showed significant improvement of leukocytosis, platelet count is also around 470,000 today.  Return to clinic in 1 year or sooner as needed.  She expressed understanding of the recommendations  Total time spent: 30 minutes including history, physical exam, review of records, counseling and coordination of care  *Total Encounter Time as defined by the Centers for Medicare and Medicaid Services includes, in addition to the  face-to-face time of a patient visit (documented in the note above) non-face-to-face time: obtaining and reviewing outside history, ordering and reviewing  medications, tests or procedures, care coordination (communications with other health care professionals or caregivers) and documentation in the medical record.

## 2023-01-07 ENCOUNTER — Other Ambulatory Visit: Payer: Self-pay | Admitting: Adult Health

## 2023-02-06 ENCOUNTER — Emergency Department (HOSPITAL_BASED_OUTPATIENT_CLINIC_OR_DEPARTMENT_OTHER): Payer: Medicare Other | Admitting: Radiology

## 2023-02-06 ENCOUNTER — Other Ambulatory Visit: Payer: Self-pay

## 2023-02-06 ENCOUNTER — Emergency Department (HOSPITAL_BASED_OUTPATIENT_CLINIC_OR_DEPARTMENT_OTHER)
Admission: EM | Admit: 2023-02-06 | Discharge: 2023-02-06 | Disposition: A | Discharge status: Home / Self Care | Payer: Medicare Other | Attending: Emergency Medicine | Admitting: Emergency Medicine

## 2023-02-06 ENCOUNTER — Emergency Department (HOSPITAL_BASED_OUTPATIENT_CLINIC_OR_DEPARTMENT_OTHER): Payer: Medicare Other

## 2023-02-06 DIAGNOSIS — D72829 Elevated white blood cell count, unspecified: Secondary | ICD-10-CM | POA: Insufficient documentation

## 2023-02-06 DIAGNOSIS — Z7982 Long term (current) use of aspirin: Secondary | ICD-10-CM | POA: Diagnosis not present

## 2023-02-06 DIAGNOSIS — M79631 Pain in right forearm: Secondary | ICD-10-CM | POA: Diagnosis not present

## 2023-02-06 DIAGNOSIS — Z794 Long term (current) use of insulin: Secondary | ICD-10-CM | POA: Insufficient documentation

## 2023-02-06 DIAGNOSIS — I10 Essential (primary) hypertension: Secondary | ICD-10-CM | POA: Diagnosis not present

## 2023-02-06 DIAGNOSIS — M25521 Pain in right elbow: Secondary | ICD-10-CM | POA: Insufficient documentation

## 2023-02-06 DIAGNOSIS — E1165 Type 2 diabetes mellitus with hyperglycemia: Secondary | ICD-10-CM | POA: Insufficient documentation

## 2023-02-06 DIAGNOSIS — M25552 Pain in left hip: Secondary | ICD-10-CM | POA: Diagnosis not present

## 2023-02-06 LAB — CBC WITH DIFFERENTIAL/PLATELET
Abs Immature Granulocytes: 0.05 10*3/uL (ref 0.00–0.07)
Basophils Absolute: 0 10*3/uL (ref 0.0–0.1)
Basophils Relative: 0 %
Eosinophils Absolute: 0.3 10*3/uL (ref 0.0–0.5)
Eosinophils Relative: 2 %
HCT: 34.7 % — ABNORMAL LOW (ref 36.0–46.0)
Hemoglobin: 11.4 g/dL — ABNORMAL LOW (ref 12.0–15.0)
Immature Granulocytes: 0 %
Lymphocytes Relative: 15 %
Lymphs Abs: 2.1 10*3/uL (ref 0.7–4.0)
MCH: 32 pg (ref 26.0–34.0)
MCHC: 32.9 g/dL (ref 30.0–36.0)
MCV: 97.5 fL (ref 80.0–100.0)
Monocytes Absolute: 0.7 10*3/uL (ref 0.1–1.0)
Monocytes Relative: 5 %
Neutro Abs: 10.6 10*3/uL — ABNORMAL HIGH (ref 1.7–7.7)
Neutrophils Relative %: 78 %
Platelets: 346 10*3/uL (ref 150–400)
RBC: 3.56 MIL/uL — ABNORMAL LOW (ref 3.87–5.11)
RDW: 13 % (ref 11.5–15.5)
WBC: 13.8 10*3/uL — ABNORMAL HIGH (ref 4.0–10.5)
nRBC: 0 % (ref 0.0–0.2)

## 2023-02-06 LAB — COMPREHENSIVE METABOLIC PANEL
ALT: 8 U/L (ref 0–44)
AST: 12 U/L — ABNORMAL LOW (ref 15–41)
Albumin: 4.4 g/dL (ref 3.5–5.0)
Alkaline Phosphatase: 73 U/L (ref 38–126)
Anion gap: 12 (ref 5–15)
BUN: 18 mg/dL (ref 8–23)
CO2: 26 mmol/L (ref 22–32)
Calcium: 10.1 mg/dL (ref 8.9–10.3)
Chloride: 99 mmol/L (ref 98–111)
Creatinine, Ser: 0.97 mg/dL (ref 0.44–1.00)
GFR, Estimated: >60 mL/min (ref >60)
Glucose, Bld: 187 mg/dL — ABNORMAL HIGH (ref 70–99)
Potassium: 3.6 mmol/L (ref 3.5–5.1)
Sodium: 137 mmol/L (ref 135–145)
Total Bilirubin: 0.5 mg/dL (ref 0.3–1.2)
Total Protein: 8.5 g/dL — ABNORMAL HIGH (ref 6.5–8.1)

## 2023-02-06 LAB — CK: Total CK: 65 U/L (ref 38–234)

## 2023-02-06 MED ORDER — KETOROLAC TROMETHAMINE 15 MG/ML IJ SOLN
15.0000 mg | Freq: Once | INTRAMUSCULAR | Status: AC
  Administered 2023-02-06: 15 mg via INTRAVENOUS
  Filled 2023-02-06: qty 1

## 2023-02-06 MED ORDER — NAPROXEN 375 MG PO TABS: 375.0000 mg | ORAL_TABLET | Freq: Two times a day (BID) | ORAL | 0 refills | Status: DC

## 2023-02-06 MED ORDER — HYDROMORPHONE HCL 1 MG/ML IJ SOLN
0.5000 mg | Freq: Once | INTRAMUSCULAR | Status: AC
  Administered 2023-02-06: 0.5 mg via INTRAVENOUS
  Filled 2023-02-06: qty 1

## 2023-02-06 NOTE — Discharge Instructions (Signed)
1.  At this time it is less likely that your joint pain is due to an infection.  You are going to be treated with anti-inflammatories and you need close follow-up with the orthopedic specialist.  However, if you develop a fever, your pain is worsening, you are developing redness around the joint or other concerning changes, return to the emergency department immediately for recheck. 2.  Start taking naproxen as prescribed twice a day.  Take this with food.  You also may continue to take your tramadol 1 to 2 tablets as you previously have for pain.  May apply ice packs to painful joints.  Be careful to make sure as well wrapped and you do not cause any damage or injury to your skin.

## 2023-02-06 NOTE — ED Provider Notes (Signed)
Polyarthralgia. Right elbow painful and swollen. Left hip painful. No injury  Physical Exam  BP 123/74 (BP Location: Left Arm)   Pulse 86   Temp 98.5 F (36.9 C) (Oral)   Resp 18   LMP 11/24/2013 (LMP Unknown)   SpO2 100%   Physical Exam  Procedures  Procedures  ED Course / MDM    Medical Decision Making Amount and/or Complexity of Data Reviewed Labs: ordered. Radiology: ordered.  Risk Prescription drug management.   Consult: Dr. Tamera Punt orthopedics.  Reviewed history of present illness, examination and current diagnostic evaluation.  At this time appears to be of lower probability to be an infectious\septic joint.  We reviewed options of admission versus outpatient treatment with NSAID versus steroid and follow-up in the office, return precautions.  The patient is alert.  She has gotten significant improvement of pain with Toradol.  Remains nontoxic.  At this time we reviewed a plan of NSAIDs and patient's tramadol for pain control with close follow-up.  Patient's daughter who is present at bedside feels more comfortable with NSAIDs and monitoring renal function due to potential for significant blood sugar elevation with the prednisone therapy.  Patient has tramadol that she takes for pain as needed and does not need additional prescription at this time.  We reviewed the necessity for immediate return if there is worsening in terms of swelling, pain, fever or generalized illness.  They voiced understanding.       Charlesetta Shanks, MD 02/06/23 [phone]

## 2023-02-06 NOTE — ED Provider Notes (Signed)
[facility] Provider Note   CSN: [id number] Arrival date & time: 02/06/23  1236     History  Chief Complaint  Patient presents with   Hand Injury    Monseratt AZARIYAH KOWALKOWSKI is a 62 y.o. female.  HPI 62 year old female with a history of fibromyalgia, type 2 diabetes, hypertension presents with right elbow/forearm pain and left hip pain.  History is from patient and family member.  Patient first got symptoms yesterday when she woke up with right elbow pain.  Feels like there is some swelling.  No trauma noted.  Has been taking her current meds and no recent change besides taking a muscle relaxer.  This morning she woke up and it was hard for her to walk due to left lateral hip pain.  Again no trauma.  There has been no fever, weakness, numbness.  Pain is currently severe. She was able to walk and bear weight but it hurt.  No back pain  Home Medications Prior to Admission medications   Medication Sig Start Date End Date Taking? Authorizing Provider  anastrozole (ARIMIDEX) 1 MG tablet TAKE 1 TABLET BY MOUTH EVERY DAY 01/08/23   Gardenia Phlegm, NP  aspirin EC 81 MG tablet Take 81 mg by mouth daily.    [provider]  [facility] MAGNESIUM 500 MG TABS Take 1 tablet by mouth at bedtime. 04/22/21   [provider]  cyclobenzaprine (FLEXERIL) 10 MG tablet Take 1 tablet (10 mg total) by mouth 2 (two) times daily as needed for up to 29 doses for muscle spasms. 12/24/19   Curatolo, Adam, DO  diclofenac sodium (VOLTAREN) 1 % GEL Apply 2 g topically daily as needed (Body).    [provider]  Diethylpropion HCl CR 75 MG TB24 Take 1 tablet by mouth daily. 09/01/21   [provider]  DULoxetine (CYMBALTA) 60 MG capsule 60 mg. 10/07/20   [provider]  DULoxetine (CYMBALTA) 60 MG capsule 60 mg. 10/07/20   [provider]  ferrous sulfate 325 (65 FE) MG tablet     [provider]  hydroxyurea (HYDREA) 500  MG capsule Take 1 capsule (500 mg total) by mouth daily. May take with food to minimize GI side effects. 10/12/21   Magrinat, Virgie Dad, MD  Insulin Lispro Prot & Lispro (HUMALOG MIX 75/25 KWIKPEN) (75-25) 100 UNIT/ML Kwikpen Inject 35-50 Units into the skin See admin instructions. 50 units in the morning and 35 units in the eveing    [provider]  losartan-hydrochlorothiazide (HYZAAR) 50-12.5 MG tablet Take 1 tablet by mouth daily.  10/28/16   [provider]  Magnesium 200 MG TABS Take 400 mg by mouth at bedtime.  07/28/19   [provider]  rosuvastatin (CRESTOR) 10 MG tablet Take 10 mg by mouth at bedtime. 09/20/21   [provider]  SUMAtriptan (IMITREX) 50 MG tablet Take by mouth. 09/29/21   [provider]  SYNJARDY XR 12.04-999 MG TB24 Take 2 tablets by mouth daily.  09/24/19   [provider]  topiramate (TOPAMAX) 100 MG tablet Take 100 mg by mouth at bedtime. 09/21/21   [provider]  traMADol (ULTRAM) 50 MG tablet Take 50 mg by mouth 5 (five) times daily as needed for moderate pain.  09/19/19   [provider]  TRULICITY [id number] SOPN Inject 0.75 mg into the skin once a week. 09/23/21   [provider]      Allergies  Patient has no known allergies.    Review of Systems   Review of Systems  Constitutional:  Negative for fever.  Respiratory:  Negative for shortness of breath.   Cardiovascular:  Positive for leg swelling (chronic).  Gastrointestinal:  Negative for diarrhea and vomiting.  Musculoskeletal:  Positive for arthralgias and joint swelling.  Neurological:  Negative for weakness, numbness and headaches.    Physical Exam Updated Vital Signs BP 123/74 (BP Location: Left Arm)   Pulse 86   Temp 98.5 F (36.9 C) (Oral)   Resp 18   LMP 11/24/2013 (LMP Unknown)   SpO2 100%  Physical Exam Vitals and nursing note reviewed.  Constitutional:      Appearance: She is well-developed.  HENT:      Head: Normocephalic and atraumatic.  Cardiovascular:     Rate and Rhythm: Normal rate and regular rhythm.     Pulses:          Dorsalis pedis pulses are 2+ on the left side.  Pulmonary:     Effort: Pulmonary effort is normal.  Musculoskeletal:     Right elbow: Decreased range of motion. Tenderness present.     Right wrist: No swelling or tenderness. Normal range of motion.     Right hand: No tenderness. Normal range of motion.     Left hand: No tenderness. Normal range of motion.     Left hip: Tenderness (lateral) present. No deformity or crepitus. Decreased range of motion (decreased flexion due to pain).     Comments: There appears to be a little bit of mild warmth to the right elbow.  I do not appreciate significant swelling.  There is tenderness to the proximal forearm/elbow. The left lateral hip seems tender.  She can straight leg raise the leg but when trying to flex the hip joint it hurts.  Do not appreciate any swelling or deformity.  No increased warmth appreciated or cellulitis.  Skin:    General: Skin is warm and dry.  Neurological:     Mental Status: She is alert.     ED Results / Procedures / Treatments   Labs (all labs ordered are listed, but only abnormal results are displayed) Labs Reviewed - No data to display  EKG None  Radiology No results found.  Procedures Procedures    Medications Ordered in ED Medications  HYDROmorphone (DILAUDID) injection 0.5 mg (has no administration in time range)    ED Course/ Medical Decision Making/ A&P                             Medical Decision Making Amount and/or Complexity of Data Reviewed Labs: ordered.    Details: Mild leukocytosis, similar to multiple other values.  Mild hyperglycemia but no acidosis. Radiology: ordered and independent interpretation performed.    Details: Hip x-ray does not show any obvious fracture.  Right elbow x-ray is inconclusive, no obvious fracture and difficult to assess for joint  effusion.  Risk Prescription drug management.   Patient is feeling somewhat better after IV Dilaudid.  She is moving her elbow better and now seems to be able to get it closer to 90 degrees.  I think is reasonable to have this x-ray repeated and see if we get a true lateral projection to see if there is a significant joint effusion.  It is mildly warm though I do not see any obvious cellulitis and is not obviously a clinical effusion so I think  we will need to see the x-ray to reassess.  After her dose of Dilaudid, which has improved some, will also give a dose of Toradol.  Care transferred to Dr. Johnney Killian.        Final Clinical Impression(s) / ED Diagnoses Final diagnoses:  None    Rx / DC Orders ED Discharge Orders     None         Sherwood Gambler, MD 02/06/23 1534

## 2023-02-06 NOTE — ED Notes (Signed)
Patient transported to X-ray 

## 2023-02-06 NOTE — ED Triage Notes (Signed)
Pt has swelling to R hand, thumb Tuesday. Joint does not feel warm but swelling is obvious. Very tender to touch. Bilateral ankle swelling. Left leg and back on left side from back down hurts making it difficult to walk. Pain is throbbing in nature constantly. Has taken "regular meds" but not helping. Denies hx of gout.

## 2023-05-16 ENCOUNTER — Emergency Department (HOSPITAL_COMMUNITY): Payer: Medicare HMO

## 2023-05-16 ENCOUNTER — Encounter (HOSPITAL_COMMUNITY): Payer: Self-pay

## 2023-05-16 ENCOUNTER — Observation Stay (HOSPITAL_COMMUNITY)
Admission: EM | Admit: 2023-05-16 | Discharge: 2023-05-17 | Disposition: A | Discharge status: Home / Self Care | Payer: Medicare HMO | Attending: Internal Medicine | Admitting: Internal Medicine

## 2023-05-16 ENCOUNTER — Telehealth: Payer: Medicare HMO | Admitting: Physician Assistant

## 2023-05-16 DIAGNOSIS — G43709 Chronic migraine without aura, not intractable, without status migrainosus: Secondary | ICD-10-CM | POA: Insufficient documentation

## 2023-05-16 DIAGNOSIS — E1122 Type 2 diabetes mellitus with diabetic chronic kidney disease: Secondary | ICD-10-CM | POA: Insufficient documentation

## 2023-05-16 DIAGNOSIS — N179 Acute kidney failure, unspecified: Secondary | ICD-10-CM | POA: Diagnosis not present

## 2023-05-16 DIAGNOSIS — N1831 Chronic kidney disease, stage 3a: Secondary | ICD-10-CM | POA: Diagnosis not present

## 2023-05-16 DIAGNOSIS — Z8673 Personal history of transient ischemic attack (TIA), and cerebral infarction without residual deficits: Secondary | ICD-10-CM | POA: Insufficient documentation

## 2023-05-16 DIAGNOSIS — I129 Hypertensive chronic kidney disease with stage 1 through stage 4 chronic kidney disease, or unspecified chronic kidney disease: Secondary | ICD-10-CM | POA: Insufficient documentation

## 2023-05-16 DIAGNOSIS — Z853 Personal history of malignant neoplasm of breast: Secondary | ICD-10-CM | POA: Insufficient documentation

## 2023-05-16 DIAGNOSIS — D75839 Thrombocytosis, unspecified: Secondary | ICD-10-CM | POA: Insufficient documentation

## 2023-05-16 DIAGNOSIS — B370 Candidal stomatitis: Secondary | ICD-10-CM | POA: Diagnosis not present

## 2023-05-16 DIAGNOSIS — E1110 Type 2 diabetes mellitus with ketoacidosis without coma: Secondary | ICD-10-CM

## 2023-05-16 DIAGNOSIS — R059 Cough, unspecified: Secondary | ICD-10-CM | POA: Diagnosis present

## 2023-05-16 DIAGNOSIS — I2510 Atherosclerotic heart disease of native coronary artery without angina pectoris: Secondary | ICD-10-CM | POA: Diagnosis not present

## 2023-05-16 DIAGNOSIS — Z7982 Long term (current) use of aspirin: Secondary | ICD-10-CM | POA: Diagnosis not present

## 2023-05-16 DIAGNOSIS — E1165 Type 2 diabetes mellitus with hyperglycemia: Principal | ICD-10-CM | POA: Insufficient documentation

## 2023-05-16 DIAGNOSIS — Z79899 Other long term (current) drug therapy: Secondary | ICD-10-CM | POA: Diagnosis not present

## 2023-05-16 DIAGNOSIS — E860 Dehydration: Secondary | ICD-10-CM | POA: Insufficient documentation

## 2023-05-16 DIAGNOSIS — R739 Hyperglycemia, unspecified: Secondary | ICD-10-CM | POA: Diagnosis present

## 2023-05-16 DIAGNOSIS — E119 Type 2 diabetes mellitus without complications: Secondary | ICD-10-CM

## 2023-05-16 LAB — CBC WITH DIFFERENTIAL/PLATELET
Abs Immature Granulocytes: 0.04 10*3/uL (ref 0.00–0.07)
Basophils Absolute: 0 10*3/uL (ref 0.0–0.1)
Basophils Relative: 0 %
Eosinophils Absolute: 0.2 10*3/uL (ref 0.0–0.5)
Eosinophils Relative: 1 %
HCT: 47.9 % — ABNORMAL HIGH (ref 36.0–46.0)
Hemoglobin: 15.1 g/dL — ABNORMAL HIGH (ref 12.0–15.0)
Immature Granulocytes: 0 %
Lymphocytes Relative: 16 %
Lymphs Abs: 1.9 10*3/uL (ref 0.7–4.0)
MCH: 28.9 pg (ref 26.0–34.0)
MCHC: 31.5 g/dL (ref 30.0–36.0)
MCV: 91.6 fL (ref 80.0–100.0)
Monocytes Absolute: 0.4 10*3/uL (ref 0.1–1.0)
Monocytes Relative: 3 %
Neutro Abs: 9.1 10*3/uL — ABNORMAL HIGH (ref 1.7–7.7)
Neutrophils Relative %: 80 %
Platelets: 457 10*3/uL — ABNORMAL HIGH (ref 150–400)
RBC: 5.23 MIL/uL — ABNORMAL HIGH (ref 3.87–5.11)
RDW: 12.5 % (ref 11.5–15.5)
WBC: 11.6 10*3/uL — ABNORMAL HIGH (ref 4.0–10.5)
nRBC: 0 % (ref 0.0–0.2)

## 2023-05-16 LAB — CBG MONITORING, ED: Glucose-Capillary: >600 mg/dL (ref 70–99)

## 2023-05-16 LAB — BASIC METABOLIC PANEL
Anion gap: 17 — ABNORMAL HIGH (ref 5–15)
BUN: 20 mg/dL (ref 8–23)
CO2: 28 mmol/L (ref 22–32)
Calcium: 10.7 mg/dL — ABNORMAL HIGH (ref 8.9–10.3)
Chloride: 86 mmol/L — ABNORMAL LOW (ref 98–111)
Creatinine, Ser: 1.43 mg/dL — ABNORMAL HIGH (ref 0.44–1.00)
GFR, Estimated: 42 mL/min — ABNORMAL LOW (ref >60)
Glucose, Bld: 726 mg/dL (ref 70–99)
Potassium: 5.1 mmol/L (ref 3.5–5.1)
Sodium: 131 mmol/L — ABNORMAL LOW (ref 135–145)

## 2023-05-16 MED ORDER — LACTATED RINGERS IV SOLN: INTRAVENOUS | Status: DC

## 2023-05-16 MED ORDER — DEXTROSE 50 % IV SOLN: 0.0000 mL | INTRAVENOUS | Status: DC | PRN

## 2023-05-16 MED ORDER — LACTATED RINGERS IV BOLUS: 20.0000 mL/kg | Freq: Once | INTRAVENOUS | Status: DC

## 2023-05-16 MED ORDER — INSULIN REGULAR(HUMAN) IN NACL 100-0.9 UT/100ML-% IV SOLN: INTRAVENOUS | Status: DC

## 2023-05-16 MED ORDER — NYSTATIN 100000 UNIT/ML MT SUSP: 5.0000 mL | Freq: Four times a day (QID) | OROMUCOSAL | 0 refills | Status: AC

## 2023-05-16 MED ORDER — SODIUM CHLORIDE 0.9 % IV BOLUS
1000.0000 mL | Freq: Once | INTRAVENOUS | Status: AC
  Administered 2023-05-16: 1000 mL via INTRAVENOUS

## 2023-05-16 MED ORDER — DEXTROSE IN LACTATED RINGERS 5 % IV SOLN: INTRAVENOUS | Status: DC

## 2023-05-16 NOTE — ED Triage Notes (Signed)
Pt presents to ED for evaluation of throat pain, thrush, swelling, and reported high glucose reading at home. Pt was diagnosed with COVID 2 weeks ago and continues to test positive on at home retests.

## 2023-05-16 NOTE — ED Provider Notes (Signed)
[facility] DEPT [facility] Emergency Department Provider Note MRN:  [id number]  Arrival date & time: 05/17/23     Chief Complaint   Oral Swelling and Hyperglycemia   History of Present Illness   Christina Webster is a 62 y.o. year-old female presents to the ED with chief complaint of cough, nausea, and vomiting.  States that she was diagnosed with COVID 2 weeks ago.  Reports having thrush on her tongue.  Reports high blood sugar.  Not able to keep anything down by orange juice. Denies fever.  She states that she had a virtual visit with her doctor today and was started on Nystatin for thrush.  She reports some improvement of the thrush over the course of the day, but remains concerned about the persistent cough, vomiting, and high blood sugar.   History provided by patient.   Review of Systems  Pertinent positive and negative review of systems noted in HPI.    Physical Exam   Vitals:   05/16/23 2200  BP: 134/83  Pulse: (!) 109  Resp: 18  Temp: 98.4 F (36.9 C)  SpO2: 100%    CONSTITUTIONAL:  non toxic-appearing, NAD NEURO:  Alert and oriented x 3, CN 3-12 grossly intact EYES:  eyes equal and reactive ENT/NECK:  Supple, no stridor, thrush CARDIO:  tachycardic, regular rhythm, appears well-perfused  PULM:  No respiratory distress, CTAB GI/GU:  non-distended,  MSK/SPINE:  No gross deformities, no edema, moves all extremities  SKIN:  no rash, atraumatic   *Additional and/or pertinent findings included in MDM below  Diagnostic and Interventional Summary    EKG Interpretation  Date/Time:    Ventricular Rate:    PR Interval:    QRS Duration:   QT Interval:    QTC Calculation:   R Axis:     Text Interpretation:         Labs Reviewed  CBC WITH DIFFERENTIAL/PLATELET - Abnormal; Notable for the following components:      Result Value   WBC 11.6 (*)    RBC 5.23 (*)    Hemoglobin 15.1 (*)    HCT 47.9 (*)    Platelets 457 (*)    Neutro Abs 9.1 (*)    All  other components within normal limits  BASIC METABOLIC PANEL - Abnormal; Notable for the following components:   Sodium 131 (*)    Chloride 86 (*)    Glucose, Bld 726 (*)    Creatinine, Ser 1.43 (*)    Calcium 10.7 (*)    GFR, Estimated 42 (*)    Anion gap 17 (*)    All other components within normal limits  CBG MONITORING, ED - Abnormal; Notable for the following components:   Glucose-Capillary >600 (*)    All other components within normal limits  URINALYSIS, ROUTINE W REFLEX MICROSCOPIC  BASIC METABOLIC PANEL  HEMOGLOBIN A1C  CBC  HEMOGLOBIN A1C    DG Chest Port 1 View  Final Result      Medications  aspirin EC tablet 81 mg (has no administration in time range)  hydroxyurea (HYDREA) capsule 500 mg (has no administration in time range)  rosuvastatin (CRESTOR) tablet 10 mg (has no administration in time range)  ARIPiprazole (ABILIFY) tablet 2 mg (has no administration in time range)  ferrous sulfate tablet 325 mg (has no administration in time range)  topiramate (TOPAMAX) tablet 100 mg (has no administration in time range)  enoxaparin (LOVENOX) injection 40 mg (has no administration in time range)  acetaminophen (TYLENOL) tablet  650 mg (has no administration in time range)    Or  acetaminophen (TYLENOL) suppository 650 mg (has no administration in time range)  HYDROcodone-acetaminophen (NORCO/VICODIN) 5-325 MG per tablet 1-2 tablet (has no administration in time range)  ondansetron (ZOFRAN) tablet 4 mg (has no administration in time range)    Or  ondansetron (ZOFRAN) injection 4 mg (has no administration in time range)  insulin detemir (LEVEMIR) injection 35 Units (has no administration in time range)  insulin aspart (novoLOG) injection 0-20 Units (has no administration in time range)  lactated ringers infusion (has no administration in time range)  nystatin (MYCOSTATIN) 100000 UNIT/ML suspension 500,000 Units (has no administration in time range)  fluconazole (DIFLUCAN)  tablet 200 mg (has no administration in time range)  losartan (COZAAR) tablet 50 mg (has no administration in time range)    And  hydrochlorothiazide (HYDRODIURIL) tablet 12.5 mg (has no administration in time range)  sodium chloride 0.9 % bolus 1,000 mL (1,000 mLs Intravenous New Bag/Given 05/16/23 2315)     Procedures  /  Critical Care .Critical Care  Performed by: Roxy Horseman, PA-C Authorized by: Roxy Horseman, PA-C   Critical care provider statement:    Critical care time (minutes):  47   Critical care was necessary to treat or prevent imminent or life-threatening deterioration of the following conditions:  Metabolic crisis   Critical care was time spent personally by me on the following activities:  Development of treatment plan with patient or surrogate, discussions with consultants, evaluation of patient's response to treatment, examination of patient, ordering and review of laboratory studies, ordering and review of radiographic studies, ordering and performing treatments and interventions, pulse oximetry, re-evaluation of patient's condition and review of old charts   ED Course and Medical Decision Making  I have reviewed the triage vital signs, the nursing notes, and pertinent available records from the EMR.  Social Determinants Affecting Complexity of Care: Patient has no clinically significant social determinants affecting this chief complaint..   ED Course:    Medical Decision Making Patient here with cough, vomiting, and elevated blood sugar.  Will check labs and reassess.  Labs concerning for DKA.  Will give fluids and start endotool.  Will need admission.  Amount and/or Complexity of Data Reviewed Labs: ordered.    Details: Glucose 726, anion gap 17, concern for DKA K 5.1 Radiology: ordered and independent interpretation performed.    Details: No opacity seen  Risk Decision regarding hospitalization.     Consultants: I consulted with Hospitalist,  Dr. Avie Arenas, who is appreciated for admitting.   Treatment and Plan: Patient's exam and diagnostic results are concerning for DKA.  Feel that patient will need admission to the hospital for further treatment and evaluation.    Final Clinical Impressions(s) / ED Diagnoses     ICD-10-CM   1. Diabetic ketoacidosis without coma associated with type 2 diabetes mellitus (HCC)  E11.10     2. Thrush  B37.0       ED Discharge Orders     None         Discharge Instructions Discussed with and Provided to Patient:   Discharge Instructions   None      Roxy Horseman, PA-C 05/17/23 0040    Zadie Rhine, MD 05/17/23 [phone]

## 2023-05-16 NOTE — Patient Instructions (Signed)
Christina Webster, thank you for joining [name], PA-C for today's virtual visit.  While this provider is not your primary care provider (PCP), if your PCP is located in our provider database this encounter information will be shared with them immediately following your visit.   A [facility] MyChart account gives you access to today's visit and all your visits, tests, and labs performed at [facility] " click here if you don't have a [facility] MyChart account or go to [email or web address]  Consent: (Patient) Christina Webster provided verbal consent for this virtual visit at the beginning of the encounter.  Current Medications:  Current Outpatient Medications:    anastrozole (ARIMIDEX) 1 MG tablet, TAKE 1 TABLET BY MOUTH EVERY DAY, Disp: 90 tablet, Rfl: 3   aspirin EC 81 MG tablet, Take 81 mg by mouth daily., Disp: , Rfl:    [employer] MAGNESIUM 500 MG TABS, Take 1 tablet by mouth at bedtime., Disp: , Rfl:    cyclobenzaprine (FLEXERIL) 10 MG tablet, Take 1 tablet (10 mg total) by mouth 2 (two) times daily as needed for up to 29 doses for muscle spasms., Disp: 20 tablet, Rfl: 0   diclofenac sodium (VOLTAREN) 1 % GEL, Apply 2 g topically daily as needed (Body)., Disp: , Rfl:    Diethylpropion HCl CR 75 MG TB24, Take 1 tablet by mouth daily., Disp: , Rfl:    DULoxetine (CYMBALTA) 60 MG capsule, 60 mg., Disp: , Rfl:    DULoxetine (CYMBALTA) 60 MG capsule, 60 mg., Disp: , Rfl:    ferrous sulfate 325 (65 FE) MG tablet, , Disp: , Rfl:    hydroxyurea (HYDREA) 500 MG capsule, Take 1 capsule (500 mg total) by mouth daily. May take with food to minimize GI side effects., Disp: 90 capsule, Rfl: 4   Insulin Lispro Prot & Lispro (HUMALOG MIX 75/25 KWIKPEN) (75-25) 100 UNIT/ML Kwikpen, Inject 35-50 Units into the skin See admin instructions. 50 units in the morning and 35 units in the eveing, Disp: , Rfl:    losartan-hydrochlorothiazide (HYZAAR) 50-12.5 MG tablet, Take 1 tablet by mouth  daily. , Disp: , Rfl: 3   Magnesium 200 MG TABS, Take 400 mg by mouth at bedtime. , Disp: , Rfl:    naproxen (NAPROSYN) 375 MG tablet, Take 1 tablet (375 mg total) by mouth 2 (two) times daily., Disp: 20 tablet, Rfl: 0   rosuvastatin (CRESTOR) 10 MG tablet, Take 10 mg by mouth at bedtime., Disp: , Rfl:    SUMAtriptan (IMITREX) 50 MG tablet, Take by mouth., Disp: , Rfl:    SYNJARDY XR 12.04-999 MG TB24, Take 2 tablets by mouth daily. , Disp: , Rfl:    topiramate (TOPAMAX) 100 MG tablet, Take 100 mg by mouth at bedtime., Disp: , Rfl:    traMADol (ULTRAM) 50 MG tablet, Take 50 mg by mouth 5 (five) times daily as needed for moderate pain. , Disp: , Rfl:    TRULICITY 0.75 MG/0.5ML SOPN, Inject 0.75 mg into the skin once a week., Disp: , Rfl:    Medications ordered in this encounter:  No orders of the defined types were placed in this encounter.    *If you need refills on other medications prior to your next appointment, please contact your pharmacy*  Follow-Up: Call back or seek an in-person evaluation if the symptoms worsen or if the condition fails to improve as anticipated.  [facility] [phone]  Other Instructions Oral Thrush, Adult Oral thrush is an  infection in your mouth and throat and on your tongue. It causes white patches to form in your mouth and on your tongue. Many cases of thrush are mild. But, sometimes, thrush can be serious. People who have a weak body defense system (immune system) or other diseases can be affected more. What are the causes? This condition is caused by a type of fungus called yeast. The fungus is normally present in small amounts in the mouth and nose. If a person has a long-term illness or a weak body defense system, the fungus can grow and spread quickly. This causes thrush. What increases the risk? You are more likely to develop this condition if: You have a weak body defense system. You are an older adult. You have diabetes, cancer,  or HIV. You have a dry mouth. You are pregnant or breastfeeding. You do not take good care of your teeth. This risk is greater for people who have false teeth (dentures). You use antibiotic or steroid medicines. What are the signs or symptoms? Symptoms of this condition include: A burning feeling in the mouth and throat. White patches that stick to the mouth and tongue. A bad taste in the mouth or trouble tasting foods. A feeling like you have cotton in your mouth. Pain when you eat and swallow. Not wanting to eat as much as usual. Cracking at the corners of the mouth. How is this treated? This condition is treated with medicines called antifungals. These medicines prevent a fungus from growing. The medicines are either put right on the area (topical) or swallowed (oral). Your doctor will also treat other problems that you may have, such as diabetes or HIV. Follow these instructions at home: Helping with pain and soreness To lessen your pain: Drink cold liquids, like water and iced tea. Eat frozen ice pops or frozen juices. Eat foods that are easy to swallow, like gelatin and ice cream. Drink from a straw if you have too much pain in your mouth.  General instructions Take or use over-the-counter and prescription medicines only as told by your doctor. Eat plain yogurt that has live cultures in it. Read the label to make sure that there are live cultures in your yogurt. If you wear false teeth: Take them out before you go to bed. Brush them well. Soak them in a cleaner. Rinse your mouth with warm salt-water many times a day. To make the salt-water mixture, dissolve -1 teaspoon (3-6 g) of salt in 1 cup (237 mL) of warm water. Contact a doctor if: Your problems do not get better within 7 days of treatment. Your infection is spreading. This may show as white areas on the skin outside of your mouth. You are nursing your baby and you have redness and pain in the nipples. Summary Oral  thrush is an infection in your mouth and throat. It is caused by a fungus. You are more likely to get this condition if you have a weak body defense system. Diseases like diabetes, cancer, or HIV also add to your risk. This condition is treated with medicines called antifungals. Contact a doctor if you do not get better within 7 days of starting treatment. This information is not intended to replace advice given to you by your health care provider. Make sure you discuss any questions you have with your health care provider. Document Revised: 11/19/2022 Document Reviewed: 11/19/2022 Elsevier Patient Education  2024 [employer].    If you have been instructed to have an in-person evaluation  today at a local Urgent Care facility, please use the link below. It will take you to a list of all of our available [facility], including address, phone number and hours of operation. Please do not delay care.  [facility]  If you or a family member do not have a primary care provider, use the link below to schedule a visit and establish care. When you choose a [facility] primary care physician or advanced practice provider, you gain a long-term partner in health. Find a Primary Care Provider  Learn more about [facility]'s in-office and virtual care options: [facility] - Get Care Now

## 2023-05-16 NOTE — Progress Notes (Signed)
Virtual Visit Consent   Christina Webster, you are scheduled for a virtual visit with a [facility] provider today. Just as with appointments in the office, your consent must be obtained to participate. Your consent will be active for this visit and any virtual visit you may have with one of our providers in the next 365 days. If you have a MyChart account, a copy of this consent can be sent to you electronically.  As this is a virtual visit, video technology does not allow for your provider to perform a traditional examination. This may limit your provider's ability to fully assess your condition. If your provider identifies any concerns that need to be evaluated in person or the need to arrange testing (such as labs, EKG, etc.), we will make arrangements to do so. Although advances in technology are sophisticated, we cannot ensure that it will always work on either your end or our end. If the connection with a video visit is poor, the visit may have to be switched to a telephone visit. With either a video or telephone visit, we are not always able to ensure that we have a secure connection.  By engaging in this virtual visit, you consent to the provision of healthcare and authorize for your insurance to be billed (if applicable) for the services provided during this visit. Depending on your insurance coverage, you may receive a charge related to this service.  I need to obtain your verbal consent now. Are you willing to proceed with your visit today? Christina Webster has provided verbal consent on 05/16/2023 for a virtual visit (video or telephone). Christina Webster, [place]  Date: 05/16/2023 8:29 AM  Virtual Visit via Video Note   I, Christina Webster, connected with  Christina Webster  ([id number], [birth year]) on 05/16/23 at  8:30 AM EDT by a video-enabled telemedicine application and verified that I am speaking with the correct person using two identifiers.  Location: Patient: Virtual Visit Location  Patient: Mobile Provider: Virtual Visit Location Provider: Home Office   I discussed the limitations of evaluation and management by telemedicine and the availability of in person appointments. The patient expressed understanding and agreed to proceed.    History of Present Illness: Christina Webster is a 62 y.o. who identifies as a female who was assigned female at birth, and is being seen today for  Possible thrush. Notes just getting over COVID (had about a week or so ago). Over past two days noting white patches on tongue with discomfort making it harder to eat. Denies fever, chills. Denies SOB. Is diabetic.   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 Swish, gargle and swallow, Disp: 140 mL, Rfl: 0   anastrozole (  ARIMIDEX) 1 MG tablet, TAKE 1 TABLET BY MOUTH EVERY DAY, Disp: 90 tablet, Rfl: 3   aspirin EC 81 MG tablet, Take 81 mg by mouth daily., Disp: , Rfl:    [employer]S MAGNESIUM 500 MG TABS, Take 1 tablet by mouth at bedtime., Disp: , Rfl:    cyclobenzaprine (FLEXERIL) 10 MG tablet, Take 1 tablet (10 mg total) by mouth 2 (two) times daily as needed for up to 29 doses for muscle spasms., Disp: 20 tablet, Rfl: 0   diclofenac  sodium (VOLTAREN) 1 % GEL, Apply 2 g topically daily as needed (Body)., Disp: , Rfl:    Diethylpropion HCl CR 75 MG TB24, Take 1 tablet by mouth daily., Disp: , Rfl:    DULoxetine (CYMBALTA) 60 MG capsule, 60 mg., Disp: , Rfl:    DULoxetine (CYMBALTA) 60 MG capsule, 60 mg., Disp: , Rfl:    ferrous sulfate 325 (65 FE) MG tablet, , Disp: , Rfl:    hydroxyurea (HYDREA) 500 MG capsule, Take 1 capsule (500 mg total) by mouth daily. May take with food to minimize GI side effects., Disp: 90 capsule, Rfl: 4   Insulin Lispro Prot & Lispro (HUMALOG MIX 75/25 KWIKPEN) (75-25) 100 UNIT/ML Kwikpen, Inject 35-50 Units into the skin See admin instructions. 50 units in the morning and 35 units in the eveing, Disp: , Rfl:    losartan-hydrochlorothiazide (HYZAAR) 50-12.5 MG tablet, Take 1 tablet by mouth daily. , Disp: , Rfl: 3   Magnesium 200 MG TABS, Take 400 mg by mouth at bedtime. , Disp: , Rfl:    naproxen (NAPROSYN) 375 MG tablet, Take 1 tablet (375 mg total) by mouth 2 (two) times daily., Disp: 20 tablet, Rfl: 0   rosuvastatin (CRESTOR) 10 MG tablet, Take 10 mg by mouth at bedtime., Disp: , Rfl:    SUMAtriptan (IMITREX) 50 MG tablet, Take by mouth., Disp: , Rfl:    SYNJARDY XR 12.04-999 MG TB24, Take 2 tablets by mouth daily. , Disp: , Rfl:    topiramate (TOPAMAX) 100 MG tablet, Take 100 mg by mouth at bedtime., Disp: , Rfl:    traMADol (ULTRAM) 50 MG tablet, Take 50 mg by mouth 5 (five) times daily as needed for moderate pain. , Disp: , Rfl:    TRULICITY 0.75 MG/0.5ML SOPN, Inject 0.75 mg into the skin once a week., Disp: , Rfl:   Observations/Objective: Patient is well-developed, well-nourished in no acute distress.  Resting comfortably in parked car. Head is normocephalic, atraumatic.  No labored breathing. Speech is clear and coherent with logical content.  Patient is alert and oriented at baseline.  Substantial white buildup noted on tongue distally and proximally. Unable to visualize buccal mucosa  via video exam. Uvual midline without lesion.   Assessment and Plan: 1. Thrush - nystatin (MYCOSTATIN) 100000 UNIT/ML suspension; Take 5 mLs (500,000 Units total) by mouth 4 (four) times daily for 7 days. Swish, gargle and swallow  Dispense: 140 mL; Refill: 0  Supportive measures and OTC medications reviewed. Start Nystatin as directed. Has PCP follow-up tomorrow already -- she is to attend this as scheduled.   Follow Up Instructions: I discussed the assessment and treatment plan with the patient. The patient was provided an opportunity to ask questions and all were answered. The patient agreed with the plan and demonstrated an understanding of the instructions.  A copy of instructions were sent to the patient via MyChart unless otherwise noted below.   The patient was advised to call back or seek an  in-person evaluation if the symptoms worsen or if the condition fails to improve as anticipated.  Time:  I spent 8 minutes with the patient via telehealth technology discussing the above problems/concerns.    Christina Climes, PA-C

## 2023-05-17 ENCOUNTER — Other Ambulatory Visit (HOSPITAL_COMMUNITY): Payer: Self-pay

## 2023-05-17 DIAGNOSIS — B370 Candidal stomatitis: Secondary | ICD-10-CM

## 2023-05-17 DIAGNOSIS — E1110 Type 2 diabetes mellitus with ketoacidosis without coma: Secondary | ICD-10-CM

## 2023-05-17 DIAGNOSIS — R739 Hyperglycemia, unspecified: Secondary | ICD-10-CM | POA: Diagnosis not present

## 2023-05-17 DIAGNOSIS — E119 Type 2 diabetes mellitus without complications: Secondary | ICD-10-CM | POA: Diagnosis not present

## 2023-05-17 LAB — CBC
HCT: 41.6 % (ref 36.0–46.0)
Hemoglobin: 13.2 g/dL (ref 12.0–15.0)
MCH: 28.6 pg (ref 26.0–34.0)
MCHC: 31.7 g/dL (ref 30.0–36.0)
MCV: 90 fL (ref 80.0–100.0)
Platelets: 425 10*3/uL — ABNORMAL HIGH (ref 150–400)
RBC: 4.62 MIL/uL (ref 3.87–5.11)
RDW: 12.2 % (ref 11.5–15.5)
WBC: 11.1 10*3/uL — ABNORMAL HIGH (ref 4.0–10.5)
nRBC: 0 % (ref 0.0–0.2)

## 2023-05-17 LAB — BASIC METABOLIC PANEL
Anion gap: 9 (ref 5–15)
BUN: 17 mg/dL (ref 8–23)
CO2: 30 mmol/L (ref 22–32)
Calcium: 9.8 mg/dL (ref 8.9–10.3)
Chloride: 102 mmol/L (ref 98–111)
Creatinine, Ser: 1.21 mg/dL — ABNORMAL HIGH (ref 0.44–1.00)
GFR, Estimated: 51 mL/min — ABNORMAL LOW (ref >60)
Glucose, Bld: 275 mg/dL — ABNORMAL HIGH (ref 70–99)
Potassium: 3.1 mmol/L — ABNORMAL LOW (ref 3.5–5.1)
Sodium: 141 mmol/L (ref 135–145)

## 2023-05-17 LAB — GLUCOSE, CAPILLARY
Glucose-Capillary: 65 mg/dL — ABNORMAL LOW (ref 70–99)
Glucose-Capillary: 135 mg/dL — ABNORMAL HIGH (ref 70–99)

## 2023-05-17 LAB — CBG MONITORING, ED
Glucose-Capillary: 458 mg/dL — ABNORMAL HIGH (ref 70–99)
Glucose-Capillary: 396 mg/dL — ABNORMAL HIGH (ref 70–99)
Glucose-Capillary: 205 mg/dL — ABNORMAL HIGH (ref 70–99)

## 2023-05-17 MED ORDER — LACTATED RINGERS IV SOLN: INTRAVENOUS | Status: DC

## 2023-05-17 MED ORDER — HYDROCODONE-ACETAMINOPHEN 5-325 MG PO TABS: 1.0000 | ORAL_TABLET | ORAL | Status: DC | PRN

## 2023-05-17 MED ORDER — INSULIN ASPART 100 UNIT/ML IJ SOLN: 0.0000 [IU] | Freq: Every day | INTRAMUSCULAR | Status: DC

## 2023-05-17 MED ORDER — INSULIN ASPART 100 UNIT/ML IJ SOLN
0.0000 [IU] | INTRAMUSCULAR | Status: DC
  Administered 2023-05-17 (×2): 20 [IU] via SUBCUTANEOUS
  Filled 2023-05-17: qty 0.2

## 2023-05-17 MED ORDER — FLUCONAZOLE 200 MG PO TABS
200.0000 mg | ORAL_TABLET | Freq: Every day | ORAL | Status: DC
  Administered 2023-05-17: 200 mg via ORAL
  Filled 2023-05-17 (×2): qty 1

## 2023-05-17 MED ORDER — INSULIN DETEMIR 100 UNIT/ML ~~LOC~~ SOLN
35.0000 [IU] | Freq: Every day | SUBCUTANEOUS | Status: DC
  Administered 2023-05-17: 35 [IU] via SUBCUTANEOUS
  Filled 2023-05-17 (×3): qty 0.35

## 2023-05-17 MED ORDER — FLUCONAZOLE 200 MG PO TABS
200.0000 mg | ORAL_TABLET | Freq: Every day | ORAL | 0 refills | Status: AC
  Filled 2023-05-17 – 2023-05-20 (×2): qty 6, 6d supply, fill #0

## 2023-05-17 MED ORDER — ONDANSETRON HCL 4 MG/2ML IJ SOLN: 4.0000 mg | Freq: Four times a day (QID) | INTRAMUSCULAR | Status: DC | PRN

## 2023-05-17 MED ORDER — ONDANSETRON HCL 4 MG PO TABS: 4.0000 mg | ORAL_TABLET | Freq: Four times a day (QID) | ORAL | Status: DC | PRN

## 2023-05-17 MED ORDER — ONDANSETRON HCL 4 MG PO TABS
4.0000 mg | ORAL_TABLET | Freq: Four times a day (QID) | ORAL | 0 refills | Status: AC | PRN
  Filled 2023-05-17: qty 30, 8d supply, fill #0

## 2023-05-17 MED ORDER — LOSARTAN POTASSIUM-HCTZ 50-12.5 MG PO TABS: 1.0000 | ORAL_TABLET | Freq: Every day | ORAL | Status: DC

## 2023-05-17 MED ORDER — TECHLITE PEN NEEDLES 32G X 4 MM MISC
3 refills | Status: AC
  Filled 2023-05-17: qty 100, 30d supply, fill #0

## 2023-05-17 MED ORDER — TOPIRAMATE 100 MG PO TABS
100.0000 mg | ORAL_TABLET | Freq: Every day | ORAL | Status: DC
  Administered 2023-05-17: 100 mg via ORAL
  Filled 2023-05-17: qty 1

## 2023-05-17 MED ORDER — ASPIRIN 81 MG PO TBEC
81.0000 mg | DELAYED_RELEASE_TABLET | Freq: Every day | ORAL | Status: DC
  Administered 2023-05-17: 81 mg via ORAL
  Filled 2023-05-17: qty 1

## 2023-05-17 MED ORDER — ACETAMINOPHEN 325 MG PO TABS: 650.0000 mg | ORAL_TABLET | Freq: Four times a day (QID) | ORAL | Status: DC | PRN

## 2023-05-17 MED ORDER — ENOXAPARIN SODIUM 40 MG/0.4ML IJ SOSY
40.0000 mg | PREFILLED_SYRINGE | INTRAMUSCULAR | Status: DC
  Administered 2023-05-17: 40 mg via SUBCUTANEOUS
  Filled 2023-05-17: qty 0.4

## 2023-05-17 MED ORDER — ACETAMINOPHEN 650 MG RE SUPP: 650.0000 mg | Freq: Four times a day (QID) | RECTAL | Status: DC | PRN

## 2023-05-17 MED ORDER — INSULIN ASPART PROT & ASPART (70-30 MIX) 100 UNIT/ML ~~LOC~~ SUSP
15.0000 [IU] | Freq: Two times a day (BID) | SUBCUTANEOUS | Status: DC
  Filled 2023-05-17: qty 10

## 2023-05-17 MED ORDER — NYSTATIN 100000 UNIT/ML MT SUSP
5.0000 mL | Freq: Four times a day (QID) | OROMUCOSAL | Status: DC
  Administered 2023-05-17: 500000 [IU] via ORAL
  Filled 2023-05-17 (×2): qty 5

## 2023-05-17 MED ORDER — HYDROXYUREA 500 MG PO CAPS: 500.0000 mg | ORAL_CAPSULE | Freq: Every day | ORAL | Status: DC

## 2023-05-17 MED ORDER — INSULIN ASPART 100 UNIT/ML IJ SOLN: 0.0000 [IU] | Freq: Three times a day (TID) | INTRAMUSCULAR | Status: DC

## 2023-05-17 MED ORDER — FERROUS SULFATE 325 (65 FE) MG PO TABS
325.0000 mg | ORAL_TABLET | Freq: Every day | ORAL | Status: DC
  Administered 2023-05-17: 325 mg via ORAL
  Filled 2023-05-17: qty 1

## 2023-05-17 MED ORDER — ROSUVASTATIN CALCIUM 10 MG PO TABS: 10.0000 mg | ORAL_TABLET | Freq: Every day | ORAL | Status: DC

## 2023-05-17 MED ORDER — HYDROCHLOROTHIAZIDE 12.5 MG PO TABS
12.5000 mg | ORAL_TABLET | Freq: Every day | ORAL | Status: DC
  Administered 2023-05-17: 12.5 mg via ORAL
  Filled 2023-05-17: qty 1

## 2023-05-17 MED ORDER — INSULIN LISPRO PROT & LISPRO (75-25 MIX) 100 UNIT/ML KWIKPEN
15.0000 [IU] | PEN_INJECTOR | Freq: Two times a day (BID) | SUBCUTANEOUS | 11 refills | Status: AC
  Filled 2023-05-17: qty 15, 50d supply, fill #0

## 2023-05-17 MED ORDER — LOSARTAN POTASSIUM 50 MG PO TABS
50.0000 mg | ORAL_TABLET | Freq: Every day | ORAL | Status: DC
  Administered 2023-05-17: 50 mg via ORAL
  Filled 2023-05-17: qty 1

## 2023-05-17 MED ORDER — POTASSIUM CHLORIDE CRYS ER 20 MEQ PO TBCR
40.0000 meq | EXTENDED_RELEASE_TABLET | Freq: Once | ORAL | Status: AC
  Administered 2023-05-17: 40 meq via ORAL
  Filled 2023-05-17: qty 2

## 2023-05-17 MED ORDER — ARIPIPRAZOLE 2 MG PO TABS
2.0000 mg | ORAL_TABLET | Freq: Every day | ORAL | Status: DC
  Administered 2023-05-17: 2 mg via ORAL
  Filled 2023-05-17 (×2): qty 1

## 2023-05-17 NOTE — Progress Notes (Signed)
Patient seen and examined, admitted this morning by Dr Avie Arenas.   Briefly 62 year old female with a CVA, poorly controlled diabetes mellitus type 2, fibromyalgia, HLP, HTN presented due to difficulty swallowing and concern for thrush.  Patient had seen telehealth and was given nystatin suspension however her symptoms continued to progress to the point that she was could not eat anything.  Per patient, she has been only drinking orange juice in the last 2 weeks.  She has been drinking 2 to 3 jugs of orange juice.  Patient reported that her symptoms started after she had COVID 2 weeks ago when she developed the thrush.  No fevers or chills.  In the ED, patient's glucose was noted to be 726, creatinine 1.43, anion gap 17, WBCs 11.6, hemoglobin 15.1.  Baseline creatinine 0.9 on 02/06/2023.  BP 118/73 (BP Location: Left Arm)   Pulse 90   Temp 98.3 F (36.8 C) (Oral)   Resp 17   Ht 5' (1.524 m)   Wt 77.1 kg   LMP 11/24/2013 (LMP Unknown)   SpO2 100%   BMI 33.20 kg/m   Physical Exam General: Alert and oriented x 3, NAD HEENT: thrush+, dry mucosal membranes Cardiovascular: S1 S2 clear, RRR.  Respiratory: CTAB, no wheezing, rales or rhonchi Gastrointestinal: Soft, nontender, nondistended, NBS Ext: no pedal edema bilaterally Neuro: no new deficits Skin: No rashes Psych: Normal affect and demeanor, alert and oriented x3    A/p   Acute kidney injury: -Baseline creatinine 0.97, on admission creatinine 1.43 -Likely prerenal due to dehydration, poor p.o. intake due to oral thrush -Continue IV fluid hydration x 24hrs, renal function improving, creatinine 1.2  Oral thrush -Presented with progressively worsening symptoms to the point that she has been unable to eat for the last 2 weeks -Placed on nystatin S+S, fluconazole x 7 days   HONK, uncontrolled diabetes mellitus type 2, NIDDM -Follow hemoglobin A1c.  Patient reported that she is not taking Humalog 75/25 insulin, stopped 2 weeks ago per  PCPs orders.  She was supposed to start Ozempic. -likely worsened due to only drinking orange juice in the last 2 weeks -Continue Levemir 35 units daily, decrease NovoLog to moderate SSI CBG (last 3)  Recent Labs    05/17/23 0335 05/17/23 0524 05/17/23 0804  GLUCAP 396* 205* 65*    Christina Webster M.D.  [facility] 05/17/2023, 11:41 AM

## 2023-05-17 NOTE — Discharge Summary (Signed)
Physician Discharge Summary   Patient: Christina Webster MRN: [id number] DOB: October 28, 1961  Admit date:     05/16/2023  Discharge date: 05/17/23  Discharge Physician: Thad Ranger, MD    PCP: Courtney Paris, NP   Recommendations at discharge:   Restarted on Humalog 75/25 15 units twice a day Patient will need to follow-up with her PCP for further changes in her diabetes medication regimen Started on fluconazole 200 mg daily for 6 more days Continue nystatin s+s until thrush resolves  Discharge Diagnoses: Acute kidney injury Oral thrush HONK hyperglycemia Uncontrolled diabetes mellitus type 2 Dehydration    Hospital Course:  Briefly 62 year old female with a CVA, poorly controlled diabetes mellitus type 2, fibromyalgia, HLP, HTN presented due to difficulty swallowing and concern for thrush.  Patient had seen telehealth and was given nystatin suspension however her symptoms continued to progress to the point that she was could not eat anything.  Per patient, she has been only drinking orange juice in the last 2 weeks.  She has been drinking 2 to 3 jugs of orange juice.  Patient reported that her symptoms started after she had COVID 2 weeks ago when she developed the thrush.  No fevers or chills.   In the ED, patient's glucose was noted to be 726, creatinine 1.43, anion gap 17, WBCs 11.6, hemoglobin 15.1.  Baseline creatinine 0.9 on 02/06/2023.   Assessment and Plan:  Acute kidney injury superimposed on CKD stage III A: -Baseline creatinine 0.97, on admission creatinine 1.43 -Likely prerenal due to dehydration, poor p.o. intake due to oral thrush -Patient was placed on IV fluid hydration, renal function improving, creatinine 1.2 at discharge.       Oral thrush -Presented with progressively worsening symptoms to the point that she has been unable to eat for the last 2 weeks -Placed on nystatin S+S, fluconazole x 7 days    HONK, uncontrolled diabetes mellitus type 2, NIDDM -Follow  hemoglobin A1c, still pending.   -Patient reported that she is not taking Humalog 75/25 insulin, stopped 2 weeks ago per PCPs orders and was supposed to start Ozempic. -likely worsened due to only drinking orange juice in the last 2 weeks -Patient was placed on Levemir and NovoLog SSI while inpatient -Patient now requesting to be discharged home as she has tolerated diet.  Discussed in detail with the patient's daughter and diabetic coordinator.  Patient will need to resume Humalog 75/25 insulin 15 units twice a day until she can follow-up with her PCP to avoid getting into hyperglycemia/DKA. -Patient is okay with resuming insulin, continue Trulicity until Ozempic can be started, continue Synjardy XR -The plan was explained to the patient's daughter in detail.  Chronic migraines Continue Topamax   Hypertension, CAD -Continue aspirin, losartan, HCTZ  Thrombocytosis Continue Hydrea      Pain control - [place] Controlled Substance Reporting System database was reviewed. and patient was instructed, not to drive, operate heavy machinery, perform activities at heights, swimming or participation in water activities or provide baby-sitting services while on Pain, Sleep and Anxiety Medications; until their outpatient Physician has advised to do so again. Also recommended to not to take more than prescribed Pain, Sleep and Anxiety Medications.  Consultants: Diabetic coordinator Procedures performed: None Disposition: Home Diet recommendation:  Discharge Diet Orders (From admission, onward)     Start     Ordered   05/17/23 0000  Diet Carb Modified        05/17/23 1402  DISCHARGE MEDICATION: Allergies as of 05/17/2023   No Known Allergies      Medication List     STOP taking these medications    naproxen 375 MG tablet Commonly known as: NAPROSYN       TAKE these medications    anastrozole 1 MG tablet Commonly known as: ARIMIDEX TAKE 1 TABLET BY MOUTH  EVERY DAY   ARIPiprazole 2 MG tablet Commonly known as: ABILIFY Take 2 mg by mouth daily.   aspirin EC 81 MG tablet Take 81 mg by mouth daily.   [facility] Magnesium 500 MG Tabs Generic drug: Magnesium Oxide -Mg Supplement Take 1 tablet by mouth at bedtime.   cyclobenzaprine 10 MG tablet Commonly known as: FLEXERIL Take 1 tablet (10 mg total) by mouth 2 (two) times daily as needed for up to 29 doses for muscle spasms.   DULoxetine 60 MG capsule Commonly known as: CYMBALTA Take 60 mg by mouth 2 (two) times daily.   ferrous sulfate 325 (65 FE) MG tablet Take 325 mg by mouth daily with breakfast.   fluconazole 200 MG tablet Commonly known as: DIFLUCAN Take 1 tablet (200 mg total) by mouth daily for 6 days. Start taking on: May 18, 2023   hydroxyurea 500 MG capsule Commonly known as: HYDREA Take 1 capsule (500 mg total) by mouth daily. May take with food to minimize GI side effects.   Insulin Lispro Prot & Lispro (75-25) 100 UNIT/ML Kwikpen Commonly known as: HumaLOG Mix 75/25 KwikPen Inject 15 Units into the skin 2 (two) times daily with a meal.   losartan-hydrochlorothiazide 50-12.5 MG tablet Commonly known as: HYZAAR Take 1 tablet by mouth daily.   nystatin 100000 UNIT/ML suspension Commonly known as: MYCOSTATIN Take 5 mLs (500,000 Units total) by mouth 4 (four) times daily for 7 days. Swish, gargle and swallow   ondansetron 4 MG tablet Commonly known as: ZOFRAN Take 1 tablet (4 mg total) by mouth every 6 (six) hours as needed for nausea or vomiting.   Qulipta 60 MG Tabs Generic drug: Atogepant Take 1 tablet by mouth daily.   rosuvastatin 10 MG tablet Commonly known as: CRESTOR Take 10 mg by mouth at bedtime.   SUMAtriptan 50 MG tablet Commonly known as: IMITREX Take 50 mg by mouth every 2 (two) hours as needed for migraine.   Synjardy XR 12.04-999 MG Tb24 Generic drug: Empagliflozin-metFORMIN HCl ER Take 2 tablets by mouth daily.   topiramate 100 MG  tablet Commonly known as: TOPAMAX Take 100 mg by mouth at bedtime.   traMADol 50 MG tablet Commonly known as: ULTRAM Take 50 mg by mouth 5 (five) times daily as needed for moderate pain.   Trulicity 0.75 MG/0.5ML Sopn Generic drug: Dulaglutide Inject 0.75 mg into the skin once a week.        Discharge Exam: Filed Weights   05/16/23 2200  Weight: 77.1 kg   S: Patient absolutely wants to go home today.  Per daughter, has fear of the hospitals.  Patient reports that she has tolerated breakfast and lunch and is willing to resume insulin.  BP 118/73 (BP Location: Left Arm)   Pulse 90   Temp 98.3 F (36.8 C) (Oral)   Resp 17   Ht 5' (1.524 m)   Wt 77.1 kg   LMP 11/24/2013 (LMP Unknown)   SpO2 100%   BMI 33.20 kg/m    Physical Exam General: Alert and oriented x 3, NAD Cardiovascular: S1 S2 clear, RRR.  Respiratory: CTAB, no wheezing, rales  or rhonchi Gastrointestinal: Soft, nontender, nondistended, NBS Ext: no pedal edema bilaterally Psych: Normal affect    Condition at discharge: fair  The results of significant diagnostics from this hospitalization (including imaging, microbiology, ancillary and laboratory) are listed below for reference.   Imaging Studies: DG Chest Port 1 View  Result Date: 05/16/2023 CLINICAL DATA:  Cough EXAM: PORTABLE CHEST 1 VIEW COMPARISON:  12/07/2013 FINDINGS: The heart size and mediastinal contours are within normal limits. Aortic atherosclerosis. Both lungs are clear. The visualized skeletal structures are unremarkable. IMPRESSION: No active disease. Electronically Signed   By: Jasmine Pang M.D.   On: 05/16/2023 22:52    Microbiology: Results for orders placed or performed during the hospital encounter of 10/31/19  Novel Coronavirus, NAA (Hosp order, Send-out to Ref Lab; TAT 18-24 hrs     Status: None   Collection Time: 10/31/19  5:23 PM   Specimen: Nasopharyngeal Swab; Respiratory  Result Value Ref Range Status   SARS-CoV-2, NAA NOT  DETECTED NOT DETECTED Final    Comment: (NOTE) This nucleic acid amplification test was developed and its performance characteristics determined by [employer]. Nucleic acid amplification tests include PCR and TMA. This test has not been FDA cleared or approved. This test has been authorized by FDA under an Emergency Use Authorization (EUA). This test is only authorized for the duration of time the declaration that circumstances exist justifying the authorization of the emergency use of in vitro diagnostic tests for detection of SARS-CoV-2 virus and/or diagnosis of COVID-19 infection under section 564(b)(1) of the Act, 21 U.S.C. 621HYQ-6(V) (1), unless the authorization is terminated or revoked sooner. When diagnostic testing is negative, the possibility of a false negative result should be considered in the context of a patient's recent exposures and the presence of clinical signs and symptoms consistent with COVID-19. An individual without symptoms of COVID- 19 and who is not shedding SARS-CoV-2 vi rus would expect to have a negative (not detected) result in this assay. Performed At: [facility] [address] [place], [place] [id number] Jolene Schimke MD MW:[phone]    Coronavirus Source NASOPHARYNGEAL  Final    Comment: Performed at [facility], [address], [place], [place] [address]    Labs: CBC: Recent Labs  Lab 05/16/23 2316 05/17/23 0450  WBC 11.6* 11.1*  NEUTROABS 9.1*  --   HGB 15.1* 13.2  HCT 47.9* 41.6  MCV 91.6 90.0  PLT 457* 425*   Basic Metabolic Panel: Recent Labs  Lab 05/16/23 2316 05/17/23 0450  NA 131* 141  K 5.1 3.1*  CL 86* 102  CO2 28 30  GLUCOSE 726* 275*  BUN 20 17  CREATININE 1.43* 1.21*  CALCIUM 10.7* 9.8   Liver Function Tests: No results for input(s): "AST", "ALT", "ALKPHOS", "BILITOT", "PROT", "ALBUMIN" in the last 168 hours. CBG: Recent Labs  Lab 05/17/23 0222 05/17/23 0335 05/17/23 0524  05/17/23 0804 05/17/23 1145  GLUCAP 458* 396* 205* 65* 135*    Discharge time spent: greater than 30 minutes.  Signed: Thad Ranger, MD [facility] 05/17/2023

## 2023-05-17 NOTE — ED Notes (Addendum)
Verbal order to give 20 units of Novolog per Dr. Alan Mulder. JRPRN

## 2023-05-17 NOTE — ED Notes (Signed)
Ambulatory to bathroom without assistance.

## 2023-05-17 NOTE — ED Notes (Signed)
ED TO INPATIENT HANDOFF REPORT  Name/Age/Gender Christina Webster 62 y.o. female  Code Status    Code Status Orders  (From admission, onward)           Start     Ordered   05/17/23 0015  Full code  Continuous       Question:  By:  Answer:  Consent: discussion documented in EHR   05/17/23 0016           Code Status History     Date Active Date Inactive Code Status Order ID Comments User Context   12/07/2013 1801 12/08/2013 1629 Full Code [id number]  [name], MD Inpatient       Home/SNF/Other Home  Chief Complaint Hyperglycemia [R73.9]  Level of Care/Admitting Diagnosis ED Disposition     ED Disposition  Admit   Condition  --   Comment  Hospital Area: [facility] [[id number]]  Level of Care: Med-Surg [16]  May place patient in observation at [facility] or [facility] if equivalent level of care is available:: Yes  Covid Evaluation: Asymptomatic - no recent exposure (last 10 days) testing not required  Diagnosis: Hyperglycemia [409811]  Admitting Physician: Alan Mulder [[id number]]  Attending Physician: Alan Mulder [phone]          Medical History Past Medical History:  Diagnosis Date   Anemia    Anxiety and depression    Cancer (HCC)    right breast can - recent dx   Chronic back pain    Diabetic peripheral neuropathy (HCC)    Family history of breast cancer    Family history of stomach cancer    Fibromyalgia    Hypercholesteremia    Hypertension    Migraines    occasional    Type II diabetes mellitus (HCC)     Allergies No Known Allergies  IV Location/Drains/Wounds Patient Lines/Drains/Airways Status     Active Line/Drains/Airways     Name Placement date Placement time Site Days   Peripheral IV 05/16/23 20 G 1.88" Left Antecubital 05/16/23  2313  Antecubital  1            Labs/Imaging Results for orders placed or performed during the hospital encounter of 05/16/23 (from the past 48 hour(s))   CBG monitoring, ED     Status: Abnormal   Collection Time: 05/16/23 11:09 PM  Result Value Ref Range   Glucose-Capillary >600 (HH) 70 - 99 mg/dL    Comment: Glucose reference range applies only to samples taken after fasting for at least 8 hours.  CBC with Differential     Status: Abnormal   Collection Time: 05/16/23 11:16 PM  Result Value Ref Range   WBC 11.6 (H) 4.0 - 10.5 K/uL   RBC 5.23 (H) 3.87 - 5.11 MIL/uL   Hemoglobin 15.1 (H) 12.0 - 15.0 g/dL   HCT 65.7 (H) 84.6 - 96.2 %   MCV 91.6 80.0 - 100.0 fL   MCH 28.9 26.0 - 34.0 pg   MCHC 31.5 30.0 - 36.0 g/dL   RDW 95.2 84.1 - 32.4 %   Platelets 457 (H) 150 - 400 K/uL   nRBC 0.0 0.0 - 0.2 %   Neutrophils Relative % 80 %   Neutro Abs 9.1 (H) 1.7 - 7.7 K/uL   Lymphocytes Relative 16 %   Lymphs Abs 1.9 0.7 - 4.0 K/uL   Monocytes Relative 3 %   Monocytes Absolute 0.4 0.1 - 1.0 K/uL   Eosinophils Relative 1 %  Eosinophils Absolute 0.2 0.0 - 0.5 K/uL   Basophils Relative 0 %   Basophils Absolute 0.0 0.0 - 0.1 K/uL   Immature Granulocytes 0 %   Abs Immature Granulocytes 0.04 0.00 - 0.07 K/uL    Comment: Performed at [facility], [address], [place], [place] [address]  Basic metabolic panel     Status: Abnormal   Collection Time: 05/16/23 11:16 PM  Result Value Ref Range   Sodium 131 (L) 135 - 145 mmol/L   Potassium 5.1 3.5 - 5.1 mmol/L   Chloride 86 (L) 98 - 111 mmol/L   CO2 28 22 - 32 mmol/L   Glucose, Bld 726 (HH) 70 - 99 mg/dL    Comment: CRITICAL RESULT CALLED TO, READ BACK BY AND VERIFIED WITH PLASTER, J RN @2346  05/16/23 BY CHILDRESS,E Glucose reference range applies only to samples taken after fasting for at least 8 hours.    BUN 20 8 - 23 mg/dL   Creatinine, Ser 6.04 (H) 0.44 - 1.00 mg/dL   Calcium 54.0 (H) 8.9 - 10.3 mg/dL   GFR, Estimated 42 (L) >60 mL/min    Comment: (NOTE) Calculated using the CKD-EPI Creatinine Equation (2021)    Anion gap 17 (H) 5 - 15    Comment: Performed at  [facility], [address], [place], [place] [address]  CBG monitoring, ED     Status: Abnormal   Collection Time: 05/17/23  2:22 AM  Result Value Ref Range   Glucose-Capillary 458 (H) 70 - 99 mg/dL    Comment: Glucose reference range applies only to samples taken after fasting for at least 8 hours.  CBG monitoring, ED     Status: Abnormal   Collection Time: 05/17/23  3:35 AM  Result Value Ref Range   Glucose-Capillary 396 (H) 70 - 99 mg/dL    Comment: Glucose reference range applies only to samples taken after fasting for at least 8 hours.  Basic metabolic panel     Status: Abnormal   Collection Time: 05/17/23  4:50 AM  Result Value Ref Range   Sodium 141 135 - 145 mmol/L    Comment: DELTA CHECK NOTED   Potassium 3.1 (L) 3.5 - 5.1 mmol/L    Comment: DELTA CHECK NOTED   Chloride 102 98 - 111 mmol/L   CO2 30 22 - 32 mmol/L   Glucose, Bld 275 (H) 70 - 99 mg/dL    Comment: Glucose reference range applies only to samples taken after fasting for at least 8 hours.   BUN 17 8 - 23 mg/dL   Creatinine, Ser 1.47 (H) 0.44 - 1.00 mg/dL   Calcium 9.8 8.9 - 82.9 mg/dL   GFR, Estimated 51 (L) >60 mL/min    Comment: (NOTE) Calculated using the CKD-EPI Creatinine Equation (2021)    Anion gap 9 5 - 15    Comment: Performed at [facility], [address], [place], [place] [address]  CBC     Status: Abnormal   Collection Time: 05/17/23  4:50 AM  Result Value Ref Range   WBC 11.1 (H) 4.0 - 10.5 K/uL   RBC 4.62 3.87 - 5.11 MIL/uL   Hemoglobin 13.2 12.0 - 15.0 g/dL   HCT 08.6 57.8 - 46.9 %   MCV 90.0 80.0 - 100.0 fL   MCH 28.6 26.0 - 34.0 pg   MCHC 31.7 30.0 - 36.0 g/dL   RDW 62.9 52.8 - 41.3 %   Platelets 425 (H) 150 - 400  K/uL   nRBC 0.0 0.0 - 0.2 %    Comment: Performed at [facility], [address], [place], [place] [address]  CBG monitoring, ED     Status: Abnormal   Collection Time: 05/17/23  5:24 AM  Result Value Ref Range    Glucose-Capillary 205 (H) 70 - 99 mg/dL    Comment: Glucose reference range applies only to samples taken after fasting for at least 8 hours.   DG Chest Port 1 View  Result Date: 05/16/2023 CLINICAL DATA:  Cough EXAM: PORTABLE CHEST 1 VIEW COMPARISON:  12/07/2013 FINDINGS: The heart size and mediastinal contours are within normal limits. Aortic atherosclerosis. Both lungs are clear. The visualized skeletal structures are unremarkable. IMPRESSION: No active disease. Electronically Signed   By: Jasmine Pang M.D.   On: 05/16/2023 22:52    Pending Labs Unresulted Labs (From admission, onward)     Start     Ordered   05/24/23 0500  Creatinine, serum  (enoxaparin (LOVENOX)    CrCl >/= 30 ml/min)  Weekly,   R     Comments: while on enoxaparin therapy    05/17/23 0016   05/17/23 0500  Hemoglobin A1c  (Glycemic Control (SSI)  Q 4 Hours / Glycemic Control (SSI)  AC +/- HS)  Tomorrow morning,   R       Comments: To assess prior glycemic control    05/17/23 0016   05/17/23 0500  Hemoglobin A1c  Once,   R       Comments: To assess prior glycemic control    05/17/23 0023   05/16/23 2323  Urinalysis, Routine w reflex microscopic -Urine, Clean Catch  Once,   URGENT       Question:  Specimen Source  Answer:  Urine, Clean Catch   05/16/23 2322            Vitals/Pain Today's Vitals   05/17/23 0144 05/17/23 0339 05/17/23 0345 05/17/23 0415  BP:  133/83 106/76 121/81  Pulse:  85 90 90  Resp:  16    Temp: 98.6 F (37 C) 97.9 F (36.6 C)    TempSrc: Oral Oral    SpO2:  96% 95% 99%  Weight:      Height:      PainSc:        Isolation Precautions No active isolations  Medications Medications  aspirin EC tablet 81 mg (has no administration in time range)  hydroxyurea (HYDREA) capsule 500 mg (has no administration in time range)  rosuvastatin (CRESTOR) tablet 10 mg (has no administration in time range)  ARIPiprazole (ABILIFY) tablet 2 mg (has no administration in time range)   ferrous sulfate tablet 325 mg (has no administration in time range)  topiramate (TOPAMAX) tablet 100 mg (100 mg Oral Given 05/17/23 0118)  enoxaparin (LOVENOX) injection 40 mg (has no administration in time range)  acetaminophen (TYLENOL) tablet 650 mg (has no administration in time range)    Or  acetaminophen (TYLENOL) suppository 650 mg (has no administration in time range)  HYDROcodone-acetaminophen (NORCO/VICODIN) 5-325 MG per tablet 1-2 tablet (has no administration in time range)  ondansetron (ZOFRAN) tablet 4 mg (has no administration in time range)    Or  ondansetron (ZOFRAN) injection 4 mg (has no administration in time range)  insulin detemir (LEVEMIR) injection 35 Units (35 Units Subcutaneous Given 05/17/23 0053)  insulin aspart (novoLOG) injection 0-20 Units (20 Units Subcutaneous Given 05/17/23 0350)  lactated ringers infusion ( Intravenous New Bag/Given 05/17/23 0117)  nystatin (  MYCOSTATIN) 100000 UNIT/ML suspension 500,000 Units (has no administration in time range)  fluconazole (DIFLUCAN) tablet 200 mg (has no administration in time range)  losartan (COZAAR) tablet 50 mg (has no administration in time range)    And  hydrochlorothiazide (HYDRODIURIL) tablet 12.5 mg (has no administration in time range)  sodium chloride 0.9 % bolus 1,000 mL (1,000 mLs Intravenous New Bag/Given 05/16/23 2315)    Mobility walks

## 2023-05-17 NOTE — H&P (Signed)
History and Physical    Christina Webster ZDG:[id number] DOB: 09-26-1961 DOA: 05/16/2023  PCP: Courtney Paris, NP   Chief Complaint: dysphagia  HPI: Christina Webster is a 62 y.o. female with medical history significant of breast cancer, poorly controlled type 2 diabetes, fibromyalgia, hyperlipidemia, hypertension who presented to the emergency department due to difficulty swallowing.  Patient saw virtual urgent care due to concern for thrush.  Patient was given nystatin suspension and discharged.  She states her symptoms continue to progress to the point where she was having worsening swelling in her mouth so she presented to the ER.  On arrival to the emergency department she was afebrile and hemodynamically stable.  Labs were obtained which revealed WBC 11.6, hemoglobin 15.1, platelets 457, sodium 131, glucose 726, creatinine 1.4 at baseline, anion gap 17.  Chest x-ray was obtained which showed no acute disease.  Patient was admitted for further evaluation of elevated glucose.  On evaluation of patient she states she is currently not on any insulin.  She takes a injection of Trulicity once a week.  She was previously prescribed insulin however is noncompliant.  She states that she was diagnosed with COVID 2 weeks ago at which time she developed thrush however symptoms have continued to progress.  She denies any fever or chills.   Review of Systems: Review of Systems  All other systems reviewed and are negative.    As per HPI otherwise 10 point review of systems negative.   No Known Allergies  Past Medical History:  Diagnosis Date   Anemia    Anxiety and depression    Cancer (HCC)    right breast can - recent dx   Chronic back pain    Diabetic peripheral neuropathy (HCC)    Family history of breast cancer    Family history of stomach cancer    Fibromyalgia    Hypercholesteremia    Hypertension    Migraines    occasional    Type II diabetes mellitus (HCC)     Past Surgical History:   Procedure Laterality Date   BREAST LUMPECTOMY WITH RADIOACTIVE SEED AND SENTINEL LYMPH NODE BIOPSY Right 11/04/2019   Procedure: RIGHT BREAST LUMPECTOMY WITH RADIOACTIVE SEED AND SENTINEL LYMPH NODE MAPPING;  Surgeon: Harriette Bouillon, MD;  Location: [facility] OR;  Service: General;  Laterality: Right;   COLONOSCOPY     TUBAL LIGATION  1988     reports that she has never smoked. She has never used smokeless tobacco. She reports current alcohol use. She reports that she does not use drugs.  Family History  Problem Relation Age of Onset   Hypertension Other    Diabetes Other    Cancer Other    Hyperlipidemia Other    Sleep apnea Other    Breast cancer Mother 64   Diabetes Mother    Hypertension Mother    Other Father        Unsure of medical history.   Breast cancer Maternal Aunt 33   Breast cancer Cousin 23       maternal cousin   Healthy Half-Sister    Healthy Half-Brother    Healthy Half-Sister    Stomach cancer Maternal Aunt 36    Prior to Admission medications   Medication Sig Start Date End Date Taking? Authorizing Provider  ARIPiprazole (ABILIFY) 2 MG tablet Take 2 mg by mouth daily. 04/20/23  Yes [provider]  aspirin EC 81 MG tablet Take 81 mg by mouth daily.   Yes [provider]  [facility] MAGNESIUM 500 MG TABS Take 1 tablet by mouth at bedtime. 04/22/21  Yes [provider]  DULoxetine (CYMBALTA) 60 MG capsule Take 60 mg by mouth 2 (two) times daily. 10/07/20  Yes [provider]  ferrous sulfate 325 (65 FE) MG tablet Take 325 mg by mouth daily with breakfast.   Yes [provider]  hydroxyurea (HYDREA) 500 MG capsule Take 1 capsule (500 mg total) by mouth daily. May take with food to minimize GI side effects. 10/12/21  Yes Magrinat, Valentino Hue, MD  losartan-hydrochlorothiazide (HYZAAR) 50-12.5 MG tablet Take 1 tablet by mouth daily.  10/28/16  Yes [provider]  nystatin (MYCOSTATIN) 100000 UNIT/ML suspension Take 5 mLs  (500,000 Units total) by mouth 4 (four) times daily for 7 days. Swish, gargle and swallow 05/16/23 05/23/23 Yes Waldon Merl, PA-C  QULIPTA 60 MG TABS Take 1 tablet by mouth daily. 04/19/23  Yes [provider]  rosuvastatin (CRESTOR) 10 MG tablet Take 10 mg by mouth at bedtime. 09/20/21  Yes [provider]  SUMAtriptan (IMITREX) 50 MG tablet Take 50 mg by mouth every 2 (two) hours as needed for migraine. 09/29/21  Yes [provider]  SYNJARDY XR 12.04-999 MG TB24 Take 2 tablets by mouth daily.  09/24/19  Yes [provider]  topiramate (TOPAMAX) 100 MG tablet Take 100 mg by mouth at bedtime. 09/21/21  Yes [provider]  traMADol (ULTRAM) 50 MG tablet Take 50 mg by mouth 5 (five) times daily as needed for moderate pain.  09/19/19  Yes [provider]  TRULICITY 0.75 MG/0.5ML SOPN Inject 0.75 mg into the skin once a week. 09/23/21  Yes [provider]  anastrozole (ARIMIDEX) 1 MG tablet TAKE 1 TABLET BY MOUTH EVERY DAY Patient not taking: Reported on 05/17/2023 01/08/23   Loa Socks, NP  cyclobenzaprine (FLEXERIL) 10 MG tablet Take 1 tablet (10 mg total) by mouth 2 (two) times daily as needed for up to 29 doses for muscle spasms. Patient not taking: Reported on 05/17/2023 12/24/19   Virgina Norfolk, DO  naproxen (NAPROSYN) 375 MG tablet Take 1 tablet (375 mg total) by mouth 2 (two) times daily. Patient not taking: Reported on 05/17/2023 02/06/23   Arby Barrette, MD    Physical Exam: Vitals:   05/16/23 2200  BP: 134/83  Pulse: (!) 109  Resp: 18  Temp: 98.4 F (36.9 C)  TempSrc: Oral  SpO2: 100%  Weight: 77.1 kg  Height: 5' (1.524 m)   Physical Exam Constitutional:      Appearance: She is obese.  HENT:     Nose: Nose normal.     Mouth/Throat:     Mouth: Mucous membranes are moist.     Pharynx: Oropharyngeal exudate present.  Eyes:     Conjunctiva/sclera: Conjunctivae normal.     Pupils: Pupils are equal, round,  and reactive to light.  Cardiovascular:     Rate and Rhythm: Normal rate and regular rhythm.     Pulses: Normal pulses.     Heart sounds: Normal heart sounds.  Pulmonary:     Effort: Pulmonary effort is normal.     Breath sounds: Normal breath sounds.  Abdominal:     General: Abdomen is flat. Bowel sounds are normal.  Musculoskeletal:        General: Normal range of motion.     Cervical back: Normal range of motion.  Skin:    General: Skin is warm.     Capillary  Refill: Capillary refill takes less than 2 seconds.  Neurological:     General: No focal deficit present.     Mental Status: Mental status is at baseline.        Labs on Admission: I have personally reviewed the patients's labs and imaging studies.  Assessment/Plan Principal Problem:   Hyperglycemia   # Hyperglycemia, POA, active # Poorly controlled type 2 diabetes - Patient presented for evaluation of thrush however was found to glucose over 700 and manage department concerned she was developing DKA - Bicarb 28 without any evidence of acidosis - Beta hydroxybutyrate and blood gas are currently pending Plan: Start Lantus 35 units daily Every 4 hour correction sliding scale IV fluids 125 cc/h overnight Follow-up A1c DC insulin drip  # Thrush - Patient's presenting with progressively worsening to patient's point where she states she has been unable to eat for 2 weeks.  Plaques noted on oral exam Plan: Start nystatin suspension and daily fluconazole oral  #Depression-continue Abilify  # Chronic migraine-continue Topamax  # Chronic pain-continue Hydrocodone  #CKD stage IIIa-trend creatinine  # CAD-continue aspirin  #HTN- continue home losartan, hydrochlorothiazide  #Thrombocytosis- continue hydrea  Admission status: Observation Med-Surg  Certification: The appropriate patient status for this patient is OBSERVATION. Observation status is judged to be reasonable and necessary in order to provide the  required intensity of service to ensure the patient's safety. The patient's presenting symptoms, physical exam findings, and initial radiographic and laboratory data in the context of their medical condition is felt to place them at decreased risk for further clinical deterioration. Furthermore, it is anticipated that the patient will be medically stable for discharge from the hospital within 2 midnights of admission.     Alan Mulder MD [facility] If 7PM-7AM, please contact night-coverage [email or web address]  05/17/2023, 12:19 AM

## 2023-05-17 NOTE — TOC CM/SW Note (Signed)
Transition of Care [facility]) - Inpatient Brief Assessment   Patient Details  Name: Christina Webster MRN: [id number] Date of Birth: Apr 09, 1961  Transition of Care [facility]) CM/SW Contact:    Otelia Santee, LCSW Phone Number: 05/17/2023, 12:47 PM   Clinical Narrative: TOC reviewed chart. No TOC needs identified. Please consult should need arise.   Transition of Care Asessment: Insurance and Status: Insurance coverage has been reviewed Patient has primary care physician: Yes Home environment has been reviewed: Lives in mobile home Prior level of function:: Independent Prior/Current Home Services: No current home services Social Determinants of Health Reivew: SDOH reviewed no interventions necessary Readmission risk has been reviewed: No (NA) Transition of care needs: no transition of care needs at this time

## 2023-05-17 NOTE — Inpatient Diabetes Management (Addendum)
Inpatient Diabetes Program Recommendations  AACE/ADA: New Consensus Statement on Inpatient Glycemic Control (2015)  Target Ranges:  Prepandial:   less than 140 mg/dL      Peak postprandial:   less than 180 mg/dL (1-2 hours)      Critically ill patients:  140 - 180 mg/dL    Latest Reference Range & Units 05/16/23 23:16  Sodium 135 - 145 mmol/L 131 (L)  Potassium 3.5 - 5.1 mmol/L 5.1  Chloride 98 - 111 mmol/L 86 (L)  CO2 22 - 32 mmol/L 28  Glucose 70 - 99 mg/dL 161 (HH)  BUN 8 - 23 mg/dL 20  Creatinine 0.96 - 0.45 mg/dL 4.09 (H)  Calcium 8.9 - 10.3 mg/dL 81.1 (H)  Anion gap 5 - 15  17 (H)    Latest Reference Range & Units 05/16/23 23:09 05/17/23 02:22 05/17/23 03:35 05/17/23 05:24  Glucose-Capillary 70 - 99 mg/dL >914 (HH)  20 units Novolog @0112   35 units Levemir @0053  458 (H) 396 (H)  20 units Novolog  205 (H)  (HH): Data is critically high (H): Data is abnormally high   Admit with: Hyperglycemia, Thrush  History: DM, Breast Cancer  Home DM Meds: Synjardy XR 12.5/ 1000 mg @ tablets Daily        Trulicity 0.75 mg Qweek         Humalog 75/25 Insulin 50 units AM/ 35 units PM (listed in last PCP note from 05/16/2023 but pt reported NOT taking--Stopped about 2 weeks ago per orders from her PCP)  Current Orders: Levemir 35 units QHS     Novolog Resistant Correction Scale/ SSI (0-20 units) Q4 hours    Current A1c Level Pending  MD- Note Mild Hypoglycemia at 8am today--May consider reducing the Novolog SSI to the 0-15 unit scale (moderate scale)     Addendum 10:30am--Met w/ pt at bedside to discuss admission glucose and home diabetes regimen.  Discussed with pt that we have a current A1c level pending--Pt self reported that her last A1c was 5.6% drawn at her old PCP office ([facility])--Cannot find this result in her chart (may have been POCT in office).  Has CBG meter at home but only checking CBGs QOD.  Told me she has started seeing new PCP Boone Master with [facility]) and that her new PCP wants her to stop the Trulicity and start Ozempic--Has not started the Ozempic yet as she had an appt today with new PCP and was supposed to get new Rx for the Ozempic.  Pt also told me she is NOT taking the Humalog 75/25 Insulin--Pt reported NOT taking--Stopped about 2 weeks ago per orders from her PCP.  Pt told me she tested herself for COVID about 2 weeks ago and result was positive--Has terrible sore throat for the last 2 weeks and has been only able to drink--Has been drinking 2-3 jugs of orange juice each week for the last 2 weeks while she has been sick and could not swallow food--Told me she knows juice is bad for her diabetes and knows she should not drink juice period.  Noted her CBGs rose at home after testing positive for COVID.  Discussed with pt that her A1c could likely be much higher since she has been drinking juice for 2 weeks and has also been sick.  Asked pt to please check her CBGs at least QD at home (sometimes fasting, sometimes after meals).  Reviewed Goal CBGs for home.  Reviewed goal A1c for home.  Pt to  follow up with PCP after d/c.   --Will follow patient during hospitalization--  Ambrose Finland RN, MSN, CDCES Diabetes Coordinator Inpatient Glycemic Control Team Team Pager: [phone] (8a-5p)

## 2023-05-18 LAB — HEMOGLOBIN A1C
Hgb A1c MFr Bld: 11 % — ABNORMAL HIGH (ref 4.8–5.6)
Mean Plasma Glucose: 269 mg/dL

## 2023-05-20 ENCOUNTER — Other Ambulatory Visit (HOSPITAL_COMMUNITY): Payer: Self-pay

## 2023-07-10 ENCOUNTER — Encounter (HOSPITAL_COMMUNITY): Payer: Self-pay | Admitting: Emergency Medicine

## 2023-07-10 ENCOUNTER — Emergency Department (HOSPITAL_COMMUNITY)
Admission: EM | Admit: 2023-07-10 | Discharge: 2023-07-10 | Disposition: A | Discharge status: Home / Self Care | Payer: Medicare HMO | Attending: Emergency Medicine | Admitting: Emergency Medicine

## 2023-07-10 ENCOUNTER — Other Ambulatory Visit: Payer: Self-pay

## 2023-07-10 ENCOUNTER — Emergency Department (HOSPITAL_COMMUNITY): Payer: Medicare HMO

## 2023-07-10 DIAGNOSIS — Z794 Long term (current) use of insulin: Secondary | ICD-10-CM | POA: Diagnosis not present

## 2023-07-10 DIAGNOSIS — W01198A Fall on same level from slipping, tripping and stumbling with subsequent striking against other object, initial encounter: Secondary | ICD-10-CM | POA: Insufficient documentation

## 2023-07-10 DIAGNOSIS — S0081XA Abrasion of other part of head, initial encounter: Secondary | ICD-10-CM | POA: Diagnosis not present

## 2023-07-10 DIAGNOSIS — Z7982 Long term (current) use of aspirin: Secondary | ICD-10-CM | POA: Diagnosis not present

## 2023-07-10 DIAGNOSIS — S8991XA Unspecified injury of right lower leg, initial encounter: Secondary | ICD-10-CM | POA: Diagnosis present

## 2023-07-10 DIAGNOSIS — R519 Headache, unspecified: Secondary | ICD-10-CM | POA: Diagnosis not present

## 2023-07-10 DIAGNOSIS — S80211A Abrasion, right knee, initial encounter: Secondary | ICD-10-CM | POA: Insufficient documentation

## 2023-07-10 DIAGNOSIS — Z79899 Other long term (current) drug therapy: Secondary | ICD-10-CM | POA: Insufficient documentation

## 2023-07-10 DIAGNOSIS — Z23 Encounter for immunization: Secondary | ICD-10-CM | POA: Insufficient documentation

## 2023-07-10 DIAGNOSIS — Y9301 Activity, walking, marching and hiking: Secondary | ICD-10-CM | POA: Insufficient documentation

## 2023-07-10 DIAGNOSIS — W19XXXA Unspecified fall, initial encounter: Secondary | ICD-10-CM

## 2023-07-10 MED ORDER — TETANUS-DIPHTH-ACELL PERTUSSIS 5-2.5-18.5 LF-MCG/0.5 IM SUSY
0.5000 mL | PREFILLED_SYRINGE | Freq: Once | INTRAMUSCULAR | Status: AC
  Administered 2023-07-10: 0.5 mL via INTRAMUSCULAR
  Filled 2023-07-10: qty 0.5

## 2023-07-10 NOTE — ED Triage Notes (Signed)
Pt reports tripping on wet grass and hit head on concrete. Abrasion to right knee and forehead. Denies LOC or blood thinners.

## 2023-07-10 NOTE — ED Provider Notes (Signed)
[place] EMERGENCY DEPARTMENT AT [facility] Provider Note   CSN: [id number] Arrival date & time: 07/10/23  4098     History  Chief Complaint  Patient presents with   [facility] Christina Webster is a 62 y.o. female.   Fall  62 year old female presenting for fall.  Patient states Christina Webster was walking today when Christina Webster slipped on wet grass and hit her forehead on the concrete.  No loss of conscious.  Mild frontal headache and abrasion.  Just has an abrasion to her right knee but no pain to her right knee and no difficulty walking.  Christina Webster is not anticoagulated, no loss of conscious.  Christina Webster is no pain in her neck even with range of motion.  No pain to her chest, abdomen, back.  Christina Webster is otherwise asymptomatic.     Home Medications Prior to Admission medications   Medication Sig Start Date End Date Taking? Authorizing Provider  anastrozole (ARIMIDEX) 1 MG tablet TAKE 1 TABLET BY MOUTH EVERY DAY Patient not taking: Reported on 05/17/2023 01/08/23   Loa Socks, NP  ARIPiprazole (ABILIFY) 2 MG tablet Take 2 mg by mouth daily. 04/20/23   [provider]  aspirin EC 81 MG tablet Take 81 mg by mouth daily.    [provider]  [employer] MAGNESIUM 500 MG TABS Take 1 tablet by mouth at bedtime. 04/22/21   [provider]  cyclobenzaprine (FLEXERIL) 10 MG tablet Take 1 tablet (10 mg total) by mouth 2 (two) times daily as needed for up to 29 doses for muscle spasms. Patient not taking: Reported on 05/17/2023 12/24/19   Virgina Norfolk, DO  DULoxetine (CYMBALTA) 60 MG capsule Take 60 mg by mouth 2 (two) times daily. 10/07/20   [provider]  ferrous sulfate 325 (65 FE) MG tablet Take 325 mg by mouth daily with breakfast.    [provider]  hydroxyurea (HYDREA) 500 MG capsule Take 1 capsule (500 mg total) by mouth daily. May take with food to minimize GI side effects. 10/12/21   Magrinat, Valentino Hue, MD  Insulin Lispro Prot & Lispro (HUMALOG MIX 75/25 KWIKPEN)  (75-25) 100 UNIT/ML Kwikpen Inject 15 Units into the skin 2 (two) times daily with a meal. 05/17/23   Rai, Ripudeep K, MD  Insulin Pen Needle (TECHLITE PEN NEEDLES) 32G X 4 MM MISC Use as directed to inject insulin. 05/17/23   Rai, Delene Ruffini, MD  losartan-hydrochlorothiazide (HYZAAR) 50-12.5 MG tablet Take 1 tablet by mouth daily.  10/28/16   [provider]  ondansetron (ZOFRAN) 4 MG tablet Take 1 tablet (4 mg total) by mouth every 6 (six) hours as needed for nausea or vomiting. 05/17/23   Rai, Ripudeep K, MD  QULIPTA 60 MG TABS Take 1 tablet by mouth daily. 04/19/23   [provider]  rosuvastatin (CRESTOR) 10 MG tablet Take 10 mg by mouth at bedtime. 09/20/21   [provider]  SUMAtriptan (IMITREX) 50 MG tablet Take 50 mg by mouth every 2 (two) hours as needed for migraine. 09/29/21   [provider]  SYNJARDY XR 12.04-999 MG TB24 Take 2 tablets by mouth daily.  09/24/19   [provider]  topiramate (TOPAMAX) 100 MG tablet Take 100 mg by mouth at bedtime. 09/21/21   [provider]  traMADol (ULTRAM) 50 MG tablet Take 50 mg by mouth 5 (five) times daily as needed for moderate pain.  09/19/19   [provider]  TRULICITY 0.75 MG/0.5ML SOPN Inject  0.75 mg into the skin once a week. 09/23/21   [provider]      Allergies    Patient has no known allergies.    Review of Systems   Review of Systems Review of systems completed and notable as per HPI.  ROS otherwise negative.   Physical Exam Updated Vital Signs BP 111/65   Pulse 77   Temp 98.3 F (36.8 C) (Oral)   Resp 17   Ht 5' (1.524 m)   Wt 74.8 kg   LMP 11/24/2013 (LMP Unknown)   SpO2 100%   BMI 32.22 kg/m  Physical Exam Vitals and nursing note reviewed.  Constitutional:      General: Christina Webster is not in acute distress.    Appearance: Christina Webster is well-developed.  HENT:     Head: Normocephalic.     Comments: Abrasion to forehead    Nose: Nose normal.     Mouth/Throat:      Mouth: Mucous membranes are moist.     Pharynx: Oropharynx is clear.  Eyes:     Extraocular Movements: Extraocular movements intact.     Conjunctiva/sclera: Conjunctivae normal.     Pupils: Pupils are equal, round, and reactive to light.  Cardiovascular:     Rate and Rhythm: Normal rate and regular rhythm.     Heart sounds: No murmur heard. Pulmonary:     Effort: Pulmonary effort is normal. No respiratory distress.     Breath sounds: Normal breath sounds.  Abdominal:     Palpations: Abdomen is soft.     Tenderness: There is no abdominal tenderness.  Musculoskeletal:        General: No swelling.     Cervical back: Normal range of motion and neck supple. No rigidity or tenderness.     Right lower leg: No edema.     Left lower leg: No edema.     Comments: Abrasion to right knee.  No pain with ambulation or palpation.  No spinal tenderness.  Skin:    General: Skin is warm and dry.     Capillary Refill: Capillary refill takes less than 2 seconds.  Neurological:     General: No focal deficit present.     Mental Status: Christina Webster is alert and oriented to person, place, and time. Mental status is at baseline.     Cranial Nerves: No cranial nerve deficit.     Sensory: No sensory deficit.     Motor: No weakness.     Gait: Gait normal.  Psychiatric:        Mood and Affect: Mood normal.     ED Results / Procedures / Treatments   Labs (all labs ordered are listed, but only abnormal results are displayed) Labs Reviewed - No data to display  EKG None  Radiology CT Head Wo Contrast  Result Date: 07/10/2023 CLINICAL DATA:  62 year old female status post fall striking head on concrete. EXAM: CT HEAD WITHOUT CONTRAST TECHNIQUE: Contiguous axial images were obtained from the base of the skull through the vertex without intravenous contrast. RADIATION DOSE REDUCTION: This exam was performed according to the departmental dose-optimization program which includes automated exposure control,  adjustment of the mA and/or kV according to patient size and/or use of iterative reconstruction technique. COMPARISON:  None Available. FINDINGS: Brain: Cerebral volume is normal for age. No midline shift, ventriculomegaly, mass effect, evidence of mass lesion, intracranial hemorrhage or evidence of cortically based acute infarction. Gray-white matter differentiation is within normal limits throughout the brain. Vascular: No suspicious  intracranial vascular hyperdensity. Dominant distal left vertebral artery, normal variant. Skull: No fracture identified. Sinuses/Orbits: Visualized paranasal sinuses and mastoids are clear. Other: Left forehead scalp hematoma and/or contusion series 4, image 37. Underlying calvarium intact. No soft tissue gas. Orbits soft tissues appear negative. IMPRESSION: 1. Left forehead scalp hematoma and/or contusion. No underlying skull fracture. 2. Normal noncontrast CT appearance of the brain. Electronically Signed   By: Odessa Fleming M.D.   On: 07/10/2023 10:43    Procedures Procedures    Medications Ordered in ED Medications  Tdap (BOOSTRIX) injection 0.5 mL (0.5 mLs Intramuscular Given 07/10/23 1029)    ED Course/ Medical Decision Making/ A&P                             Medical Decision Making Amount and/or Complexity of Data Reviewed Radiology: ordered.  Risk Prescription drug management.   Medical Decision Making:   ANDI LAYFIELD is a 62 y.o. female who presented to the ED today with mechanical fall.  Vital signs reviewed.  On exam Christina Webster is well-appearing, Christina Webster has mild abrasion to her right knee and forehead.  Given fall with head injury will obtain CT head.  Christina Webster has no neck pain and is low risk for C-spine injury by both Nexus and [place] CT criteria.  Christina Webster has no signs of other injury other than abrasion to the right knee.  Christina Webster is no pain with ambulation and low concern for fracture or malalignment.  Tetanus updated.  CT reviewed, no signs of acute intracranial  abnormality.  Recommend PCP follow-up.  Discharge.  Patient's presentation is most consistent with acute presentation with potential threat to life or bodily function.           Final Clinical Impression(s) / ED Diagnoses Final diagnoses:  Fall, initial encounter    Rx / DC Orders ED Discharge Orders     None         Laurence Spates, MD 07/10/23 1827

## 2023-07-10 NOTE — Discharge Instructions (Signed)
Your CT scan was reassuring.  I recommend you follow-up with your primary care provider.  If you have severe pain or any other new concerning symptoms you should return to the ED.

## 2023-09-30 ENCOUNTER — Inpatient Hospital Stay: Payer: Medicare HMO | Attending: Hematology and Oncology | Admitting: Hematology and Oncology

## 2023-10-04 ENCOUNTER — Telehealth: Payer: Self-pay | Admitting: Hematology and Oncology

## 2023-10-04 NOTE — Telephone Encounter (Signed)
 Left patient a vm regarding upcoming appointment

## 2023-10-31 ENCOUNTER — Inpatient Hospital Stay: Payer: Medicare HMO

## 2023-10-31 ENCOUNTER — Inpatient Hospital Stay: Payer: Medicare HMO | Attending: Hematology and Oncology | Admitting: Hematology and Oncology

## 2023-10-31 VITALS — BP 124/65 | HR 109 | Temp 97.6°F | Resp 16 | Wt 193.2 lb

## 2023-10-31 DIAGNOSIS — Z803 Family history of malignant neoplasm of breast: Secondary | ICD-10-CM | POA: Diagnosis not present

## 2023-10-31 DIAGNOSIS — D0511 Intraductal carcinoma in situ of right breast: Secondary | ICD-10-CM

## 2023-10-31 DIAGNOSIS — Z800 Family history of malignant neoplasm of digestive organs: Secondary | ICD-10-CM | POA: Diagnosis not present

## 2023-10-31 DIAGNOSIS — D75839 Thrombocytosis, unspecified: Secondary | ICD-10-CM | POA: Insufficient documentation

## 2023-10-31 DIAGNOSIS — Z79811 Long term (current) use of aromatase inhibitors: Secondary | ICD-10-CM | POA: Diagnosis not present

## 2023-10-31 DIAGNOSIS — Z7982 Long term (current) use of aspirin: Secondary | ICD-10-CM | POA: Diagnosis not present

## 2023-10-31 DIAGNOSIS — Z923 Personal history of irradiation: Secondary | ICD-10-CM | POA: Diagnosis not present

## 2023-10-31 DIAGNOSIS — D72829 Elevated white blood cell count, unspecified: Secondary | ICD-10-CM | POA: Diagnosis not present

## 2023-10-31 DIAGNOSIS — Z79899 Other long term (current) drug therapy: Secondary | ICD-10-CM | POA: Diagnosis not present

## 2023-10-31 LAB — CMP (CANCER CENTER ONLY)
ALT: 12 U/L (ref 0–44)
AST: 13 U/L — ABNORMAL LOW (ref 15–41)
Albumin: 4.2 g/dL (ref 3.5–5.0)
Alkaline Phosphatase: 84 U/L (ref 38–126)
Anion gap: 11 (ref 5–15)
BUN: 18 mg/dL (ref 8–23)
CO2: 26 mmol/L (ref 22–32)
Calcium: 9.8 mg/dL (ref 8.9–10.3)
Chloride: 105 mmol/L (ref 98–111)
Creatinine: 1.19 mg/dL — ABNORMAL HIGH (ref 0.44–1.00)
GFR, Estimated: 52 mL/min — ABNORMAL LOW (ref >60)
Glucose, Bld: 190 mg/dL — ABNORMAL HIGH (ref 70–99)
Potassium: 3.6 mmol/L (ref 3.5–5.1)
Sodium: 142 mmol/L (ref 135–145)
Total Bilirubin: 0.3 mg/dL (ref <1.2)
Total Protein: 8.2 g/dL — ABNORMAL HIGH (ref 6.5–8.1)

## 2023-10-31 LAB — CBC WITH DIFFERENTIAL/PLATELET
Abs Immature Granulocytes: 0.05 10*3/uL (ref 0.00–0.07)
Basophils Absolute: 0 10*3/uL (ref 0.0–0.1)
Basophils Relative: 0 %
Eosinophils Absolute: 0.8 10*3/uL — ABNORMAL HIGH (ref 0.0–0.5)
Eosinophils Relative: 6 %
HCT: 39.3 % (ref 36.0–46.0)
Hemoglobin: 13.2 g/dL (ref 12.0–15.0)
Immature Granulocytes: 0 %
Lymphocytes Relative: 28 %
Lymphs Abs: 3.6 10*3/uL (ref 0.7–4.0)
MCH: 32 pg (ref 26.0–34.0)
MCHC: 33.6 g/dL (ref 30.0–36.0)
MCV: 95.2 fL (ref 80.0–100.0)
Monocytes Absolute: 0.6 10*3/uL (ref 0.1–1.0)
Monocytes Relative: 5 %
Neutro Abs: 8.1 10*3/uL — ABNORMAL HIGH (ref 1.7–7.7)
Neutrophils Relative %: 61 %
Platelets: 437 10*3/uL — ABNORMAL HIGH (ref 150–400)
RBC: 4.13 MIL/uL (ref 3.87–5.11)
RDW: 12.5 % (ref 11.5–15.5)
WBC: 13.2 10*3/uL — ABNORMAL HIGH (ref 4.0–10.5)
nRBC: 0 % (ref 0.0–0.2)

## 2023-10-31 NOTE — Progress Notes (Signed)
[facility]  [phone] [phone]    ID: Christina Webster DOB: 1961-07-29  MR#: [id number]  IHK#:[id number]  Patient Care Team: Sarita Bottom, NP as PCP - General (Neurology) Pershing Proud, RN as Oncology Nurse Navigator Donnelly Angelica, RN as Oncology Nurse Navigator Mansouraty, Netty Starring., MD as Consulting Physician (Gastroenterology) Dorothy Puffer, MD as Consulting Physician (Radiation Oncology) Harriette Bouillon, MD as Consulting Physician (General Surgery) Domenick Gong, MD as Referring Physician (Hematology and Oncology) Rachel Moulds, MD as Medical Oncologist (Hematology and Oncology) Rachel Moulds, MD OTHER MD:  CHIEF COMPLAINT: Ductal carcinoma in situ; thrombocytosis  CURRENT TREATMENT: Anastrozole  INTERVAL HISTORY:  Christina Webster today for follow up.  The patient, with a history of  thrombocytosis and breast cancer presents for a routine follow-up. She reports no side effects from her current medication regimen. She has not seen Dr. Lowella Bandy since her initial visit six months ago due to other health issues. She has been managing her condition with the help of the current provider and has not felt the need to see Dr. Lowella Bandy again.  The patient also reports having a mammogram done at [facility] during the summer. The results were reportedly negative, with no changes or lumps noticed in the breasts. She has no issues with breathing, bowel movements, or urination.  Additionally, the patient has arthritis in her shoulder, which limits her mobility. 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 COVID 19 VACCINATION STATUS: Pfizer x3, most recently 11/2020   HISTORY OF CURRENT ILLNESS:    From the original intake note:  Christina Webster on 09/26/2019 showing a possible abnormality in the right breast. She underwent right diagnostic  Webster with tomography at [facility] on 10/01/2019 showing: breast density category C; 1.4 cm linear calcifications in the right breast, upper-outer quadrant.  Accordingly on 10/14/2019 she proceeded to biopsy of the right breast area in question. 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 Prognostic indicators significant for: estrogen receptor, 100% positive with strong staining intensity and progesterone receptor, 0% negative.   The patient's subsequent history is as detailed below.   PAST MEDICAL HISTORY: Past Medical History:  Diagnosis Date   Anemia    Anxiety and depression    Cancer (HCC)    right breast can - recent dx   Chronic back pain    Diabetic peripheral neuropathy (HCC)    Family history of breast cancer    Family history of stomach cancer    Fibromyalgia    Hypercholesteremia    Hypertension    Migraines    occasional    Type II diabetes mellitus (HCC)     PAST SURGICAL HISTORY: Past Surgical History:  Procedure Laterality Date   BREAST LUMPECTOMY WITH RADIOACTIVE SEED AND SENTINEL LYMPH NODE BIOPSY Right 11/04/2019   Procedure: RIGHT BREAST LUMPECTOMY WITH RADIOACTIVE SEED AND SENTINEL LYMPH NODE MAPPING;  Surgeon: Harriette Bouillon, MD;  Location: [facility] OR;  Service: General;  Laterality: Right;   COLONOSCOPY     TUBAL LIGATION  1988    FAMILY HISTORY: Family History  Problem Relation Age of Onset   Hypertension Other    Diabetes Other    Cancer Other    Hyperlipidemia Other    Sleep apnea Other    Breast cancer Mother 77   Diabetes Mother    Hypertension  Mother    Other Father        Posey Rea of medical history.   Breast cancer Maternal Aunt 34   Breast cancer Cousin 35       maternal cousin   Healthy Half-Sister    Healthy Half-Brother    Healthy Half-Sister    Stomach cancer Maternal Aunt 23  The patient has no information regarding her father or his side of  the family.  Patient's mother is 54 years old as of November 2020.  The patient has 1 brother, 2 sisters.  The patient's mother had breast cancer at age 64.  There is also a maternal aunt and 2 maternal cousins with breast cancer.   GYNECOLOGIC HISTORY:  Patient's last menstrual period was 11/24/2013 (lmp unknown). Menarche: 62 years old Age at first live birth: 62 years old GX P 3 LMP 2014 HRT no  Hysterectomy? no BSO? no   SOCIAL HISTORY: (updated 10/2019)  Christina "Candy" is disabled secondary to fibromyalgia. She is single. She lives at home with her daughters Shanda Bumps and [place] and 4 grandchildren.  Daughter Maxine Glenn lives in [place] and currently is unemployed but has worked at a call center.  Daughter Shanda Bumps works as a Glass blower/designer and daughter [place] also work lives in [place] and works as a Designer, multimedia from home.  The patient is not a church attender    ADVANCED DIRECTIVES: Not in place but she intends to name her daughter Maxine Glenn as healthcare power of attorney.  Maxine Glenn can be reached at [phone]   HEALTH MAINTENANCE: Social History   Tobacco Use   Smoking status: Never   Smokeless tobacco: Never  Vaping Use   Vaping status: Never Used  Substance Use Topics   Alcohol use: Yes    Comment: -occasional   Drug use: No     Colonoscopy: 08/2018 (Dr. Meridee Score), repeat 2029  PAP: none on file  Bone density: none on file   No Known Allergies  Current Outpatient Medications  Medication Sig Dispense Refill   anastrozole (ARIMIDEX) 1 MG tablet TAKE 1 TABLET BY MOUTH EVERY DAY (Patient not taking: Reported on 05/17/2023) 90 tablet 3   ARIPiprazole (ABILIFY) 2 MG tablet Take 2 mg by mouth daily.     aspirin EC 81 MG tablet Take 81 mg by mouth daily.     [facility] MAGNESIUM 500 MG TABS Take 1 tablet by mouth at bedtime.     cyclobenzaprine (FLEXERIL) 10 MG tablet Take 1 tablet (10 mg total) by mouth 2 (two) times daily as needed for up to 29 doses for muscle spasms. (Patient  not taking: Reported on 05/17/2023) 20 tablet 0   DULoxetine (CYMBALTA) 60 MG capsule Take 60 mg by mouth 2 (two) times daily.     ferrous sulfate 325 (65 FE) MG tablet Take 325 mg by mouth daily with breakfast.     hydroxyurea (HYDREA) 500 MG capsule Take 1 capsule (500 mg total) by mouth daily. May take with food to minimize GI side effects. 90 capsule 4   Insulin Lispro Prot & Lispro (HUMALOG MIX 75/25 KWIKPEN) (75-25) 100 UNIT/ML Kwikpen Inject 15 Units into the skin 2 (two) times daily with a meal. 15 mL 11   Insulin Pen Needle (TECHLITE PEN NEEDLES) 32G X 4 MM MISC Use as directed to inject insulin. 100 each 3   losartan-hydrochlorothiazide (HYZAAR) 50-12.5 MG tablet Take 1 tablet by mouth daily.   3   ondansetron (ZOFRAN) 4 MG tablet Take 1 tablet (4 mg total) by  mouth every 6 (six) hours as needed for nausea or vomiting. 30 tablet 0   QULIPTA 60 MG TABS Take 1 tablet by mouth daily.     rosuvastatin (CRESTOR) 10 MG tablet Take 10 mg by mouth at bedtime.     SUMAtriptan (IMITREX) 50 MG tablet Take 50 mg by mouth every 2 (two) hours as needed for migraine.     SYNJARDY XR 12.04-999 MG TB24 Take 2 tablets by mouth daily.      topiramate (TOPAMAX) 100 MG tablet Take 100 mg by mouth at bedtime.     traMADol (ULTRAM) 50 MG tablet Take 50 mg by mouth 5 (five) times daily as needed for moderate pain.      TRULICITY 0.75 MG/0.5ML SOPN Inject 0.75 mg into the skin once a week.     No current facility-administered medications for this visit.    OBJECTIVE: African-American woman in no acute distress  Vitals:   10/31/23 1416  BP: 124/65  Pulse: (!) 109  Resp: 16  Temp: 97.6 F (36.4 C)  SpO2: 99%      Body mass index is 37.73 kg/m.   Wt Readings from Last 3 Encounters:  10/31/23 193 lb 3.2 oz (87.6 kg)  07/10/23 165 lb (74.8 kg)  05/16/23 170 lb (77.1 kg)     ECOG FS:1 - Symptomatic but completely ambulatory  Physical Exam Constitutional:      Appearance: Normal appearance.   Chest:     Comments: Bilateral breasts inspected.  Right breast status postlumpectomy.  No palpable concerns.  No regional adenopathy.  Left breast normal to inspection and palpation. Musculoskeletal:     Cervical back: Normal range of motion and neck supple. No rigidity.  Lymphadenopathy:     Cervical: No cervical adenopathy.  Neurological:     Mental Status: She is alert.      LAB RESULTS:  CMP     Component Value Date/Time   NA 141 05/17/2023 0450   K 3.1 (L) 05/17/2023 0450   CL 102 05/17/2023 0450   CO2 30 05/17/2023 0450   GLUCOSE 275 (H) 05/17/2023 0450   BUN 17 05/17/2023 0450   CREATININE 1.21 (H) 05/17/2023 0450   CREATININE 0.98 09/28/2022 0800   CALCIUM 9.8 05/17/2023 0450   PROT 8.5 (H) 02/06/2023 1327   ALBUMIN 4.4 02/06/2023 1327   AST 12 (L) 02/06/2023 1327   AST 9 (L) 09/28/2022 0800   ALT 8 02/06/2023 1327   ALT 7 09/28/2022 0800   ALKPHOS 73 02/06/2023 1327   BILITOT 0.5 02/06/2023 1327   BILITOT 0.4 09/28/2022 0800   GFRNONAA 51 (L) 05/17/2023 0450   GFRNONAA >60 09/28/2022 0800   GFRAA >60 10/30/2019 0853   GFRAA 49 (L) 10/21/2019 1232    No results found for: "TOTALPROTELP", "ALBUMINELP", "A1GS", "A2GS", "BETS", "BETA2SER", "GAMS", "MSPIKE", "SPEI"  No results found for: "KPAFRELGTCHN", "LAMBDASER", "KAPLAMBRATIO"  Lab Results  Component Value Date   WBC 11.1 (H) 05/17/2023   NEUTROABS 9.1 (H) 05/16/2023   HGB 13.2 05/17/2023   HCT 41.6 05/17/2023   MCV 90.0 05/17/2023   PLT 425 (H) 05/17/2023   No results found for: "LABCA2"  No components found for: "ZOXWRU045"  No results for input(s): "INR" in the last 168 hours.  No results found for: "LABCA2"  No results found for: "WUJ811"  No results found for: "CAN125"  No results found for: "CAN153"  No results found for: "CA2729"  No components found for: "HGQUANT"  No results found for: "CEA1", "  CEA" / No results found for: "CEA1", "CEA"   No results found for:  "AFPTUMOR"  No results found for: "CHROMOGRNA"  No results found for: "HGBA", "HGBA2QUANT", "HGBFQUANT", "HGBSQUAN" (Hemoglobinopathy evaluation)   No results found for: "LDH"  No results found for: "IRON", "TIBC", "IRONPCTSAT" (Iron and TIBC)  No results found for: "FERRITIN"  Urinalysis    Component Value Date/Time   COLORURINE YELLOW 12/07/2013 1234   APPEARANCEUR TURBID (A) 12/07/2013 1234   LABSPEC 1.035 (H) 12/07/2013 1234   PHURINE 5.5 12/07/2013 1234   GLUCOSEU >1000 (A) 12/07/2013 1234   HGBUR MODERATE (A) 12/07/2013 1234   BILIRUBINUR NEGATIVE 12/07/2013 1234   KETONESUR NEGATIVE 12/07/2013 1234   PROTEINUR 30 (A) 12/07/2013 1234   UROBILINOGEN 0.2 12/07/2013 1234   NITRITE NEGATIVE 12/07/2013 1234   LEUKOCYTESUR LARGE (A) 12/07/2013 1234    STUDIES: No results found.   ELIGIBLE FOR AVAILABLE RESEARCH PROTOCOL: Not a comment candidate  ASSESSMENT: 62 y.o. 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, estrogen and progesterone receptor positive  (1) genetics testing 11/09/2019 through the [facility] Breast Cancer STAT panel and Common Hereditary Cancers panel found no deleterious mutations in  ATM, BRCA1, BRCA2, CDH1, CHEK2, PALB2, PTEN, STK11 and TP53, APC, ATM, AXIN2, BARD1, BMPR1A, BRCA1, BRCA2, BRIP1, CDH1, CDK4, CDKN2A (p14ARF), CDKN2A (p16INK4a), CHEK2, CTNNA1, DICER1, EPCAM (Deletion/duplication testing only), GREM1 (promoter region deletion/duplication testing only), KIT, MEN1, MLH1, MSH2, MSH3, MSH6, MUTYH, NBN, NF1, NHTL1, PALB2, PDGFRA, PMS2, POLD1, POLE, PTEN, RAD50, RAD51C, RAD51D, RNF43, SDHB, SDHC, SDHD, SMAD4, SMARCA4. STK11, TP53, TSC1, TSC2, and VHL.  The following genes were evaluated for sequence changes only: SDHA and HOXB13 c.251G>A variant only.  (a) two variants of uncertain significance were detected: one in the MSH3 gene called c.1502G>A, and one in the POLD1 gene called  c.3290G>A.   (2) status post right lumpectomy and sentinel lymph node sampling 11/04/2019 for ductal carcinoma in situ, 1.1 cm, grade 2, with negative margins.  (a) 3 right axilla sentinel lymph nodes were clear  (3) adjuvant radiation 12/07/2019 through 01/22/2020 The right breast was treated to 50.4 Gy in 28 fractions followed by a 10 Gy boost in 5 fractions to the surgical cavity.   (4) started anastrozole 02/15/2020  (a) bone density  (5) PERSISTENT LEUKOCYTOSIS AND THROMBOCYTOSIS:  (a) peripheral blood nondiagnostic, showing a normocytic anemia and eosinophilia (b) left posterior iliac crest biopsy 08/17/2021 shows a hypercellular marrow with atypical megakaryocytes (occasional large hyperlobated forms, small hypolobated forms, and a few bizarre nuclei); there is no increase in blasts, no monoclonal B-cell or phenotypically aberrant T-cell population; there is adequate storage iron; reticulin stain reveals no significant fibrosis (c) FISH for chromosome 8, 20, 5q-/-5/+5, 7q-/-7 tri, or KMT2A (HYQ)(65H84) not detected (d) cytogenetics showed no metaphase cells available for analysis (e) BCR/ABL negative by FISH (09/27/2021)  (f) NGS by [employer] 09/27/2021 showed no relevant mutations in JAK2, MPL or CALR but a TP53 p.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 Lowella Bandy); continues on ASA 81 mg daily   PLAN:  Breast Cancer Surveillance No changes in breasts or lumps felt. Mammogram performed at [facility] in the summer of 2024 was reportedly negative. -Request a copy of the mammogram report from [facility] -  Continue annual mammograms at [facility]. Pt says her PCP ordered  it.  Platelet Count Slightly elevated in May 2024. This remained stable on labs today -Order repeat blood work to reassess platelet count. -continue aspirin daily.  Medication Review Patient is on anastrozole since March 2021, with no reported side effects. -Continue current medication regimen till March 2026  General Health Maintenance -Continue current health maintenance practices. -Return for follow-up in one year.  Total time spent: 30 minutes including history, physical exam, review of records, counseling and coordination of care  *Total Encounter Time as defined by the Centers for Medicare and Medicaid Services includes, in addition to the face-to-face time of a patient visit (documented in the note above) non-face-to-face time: obtaining and reviewing outside history, ordering and reviewing medications, tests or procedures, care coordination (communications with other health care professionals or caregivers) and documentation in the medical record.

## 2024-01-11 ENCOUNTER — Other Ambulatory Visit: Payer: Self-pay | Admitting: Adult Health

## 2024-01-29 ENCOUNTER — Other Ambulatory Visit: Payer: Self-pay | Admitting: Student

## 2024-01-29 ENCOUNTER — Ambulatory Visit
Admission: RE | Admit: 2024-01-29 | Discharge: 2024-01-29 | Disposition: A | Discharge status: Home / Self Care | Payer: Medicare HMO | Source: Ambulatory Visit | Attending: Student

## 2024-01-29 DIAGNOSIS — M25512 Pain in left shoulder: Secondary | ICD-10-CM

## 2024-01-29 DIAGNOSIS — M150 Primary generalized (osteo)arthritis: Secondary | ICD-10-CM

## 2024-05-26 ENCOUNTER — Emergency Department (HOSPITAL_COMMUNITY)
Admission: EM | Admit: 2024-05-26 | Discharge: 2024-05-26 | Disposition: A | Discharge status: Home / Self Care | Attending: Emergency Medicine | Admitting: Emergency Medicine

## 2024-05-26 ENCOUNTER — Encounter (HOSPITAL_COMMUNITY): Payer: Self-pay

## 2024-05-26 ENCOUNTER — Other Ambulatory Visit: Payer: Self-pay

## 2024-05-26 DIAGNOSIS — Z7982 Long term (current) use of aspirin: Secondary | ICD-10-CM | POA: Diagnosis not present

## 2024-05-26 DIAGNOSIS — Z853 Personal history of malignant neoplasm of breast: Secondary | ICD-10-CM | POA: Insufficient documentation

## 2024-05-26 DIAGNOSIS — I10 Essential (primary) hypertension: Secondary | ICD-10-CM | POA: Diagnosis not present

## 2024-05-26 DIAGNOSIS — E860 Dehydration: Secondary | ICD-10-CM | POA: Diagnosis not present

## 2024-05-26 DIAGNOSIS — E876 Hypokalemia: Secondary | ICD-10-CM | POA: Insufficient documentation

## 2024-05-26 DIAGNOSIS — R1032 Left lower quadrant pain: Secondary | ICD-10-CM | POA: Insufficient documentation

## 2024-05-26 DIAGNOSIS — Z79899 Other long term (current) drug therapy: Secondary | ICD-10-CM | POA: Insufficient documentation

## 2024-05-26 DIAGNOSIS — E119 Type 2 diabetes mellitus without complications: Secondary | ICD-10-CM | POA: Diagnosis not present

## 2024-05-26 DIAGNOSIS — Z794 Long term (current) use of insulin: Secondary | ICD-10-CM | POA: Insufficient documentation

## 2024-05-26 DIAGNOSIS — R197 Diarrhea, unspecified: Secondary | ICD-10-CM | POA: Diagnosis present

## 2024-05-26 LAB — CBC WITH DIFFERENTIAL/PLATELET
Abs Immature Granulocytes: 0.06 10*3/uL (ref 0.00–0.07)
Basophils Absolute: 0.1 10*3/uL (ref 0.0–0.1)
Basophils Relative: 0 %
Eosinophils Absolute: 0.3 10*3/uL (ref 0.0–0.5)
Eosinophils Relative: 2 %
HCT: 42.8 % (ref 36.0–46.0)
Hemoglobin: 14.3 g/dL (ref 12.0–15.0)
Immature Granulocytes: 0 %
Lymphocytes Relative: 20 %
Lymphs Abs: 2.7 10*3/uL (ref 0.7–4.0)
MCH: 30.8 pg (ref 26.0–34.0)
MCHC: 33.4 g/dL (ref 30.0–36.0)
MCV: 92 fL (ref 80.0–100.0)
Monocytes Absolute: 0.5 10*3/uL (ref 0.1–1.0)
Monocytes Relative: 4 %
Neutro Abs: 9.9 10*3/uL — ABNORMAL HIGH (ref 1.7–7.7)
Neutrophils Relative %: 74 %
Platelets: 500 10*3/uL — ABNORMAL HIGH (ref 150–400)
RBC: 4.65 MIL/uL (ref 3.87–5.11)
RDW: 13.4 % (ref 11.5–15.5)
WBC: 13.5 10*3/uL — ABNORMAL HIGH (ref 4.0–10.5)
nRBC: 0 % (ref 0.0–0.2)

## 2024-05-26 LAB — COMPREHENSIVE METABOLIC PANEL WITH GFR
ALT: 19 U/L (ref 0–44)
AST: 22 U/L (ref 15–41)
Albumin: 3.8 g/dL (ref 3.5–5.0)
Alkaline Phosphatase: 96 U/L (ref 38–126)
Anion gap: 12 (ref 5–15)
BUN: 35 mg/dL — ABNORMAL HIGH (ref 8–23)
CO2: 25 mmol/L (ref 22–32)
Calcium: 9.4 mg/dL (ref 8.9–10.3)
Chloride: 100 mmol/L (ref 98–111)
Creatinine, Ser: 1.36 mg/dL — ABNORMAL HIGH (ref 0.44–1.00)
GFR, Estimated: 44 mL/min — ABNORMAL LOW (ref >60)
Glucose, Bld: 109 mg/dL — ABNORMAL HIGH (ref 70–99)
Potassium: 2.9 mmol/L — ABNORMAL LOW (ref 3.5–5.1)
Sodium: 137 mmol/L (ref 135–145)
Total Bilirubin: 1 mg/dL (ref 0.0–1.2)
Total Protein: 8.6 g/dL — ABNORMAL HIGH (ref 6.5–8.1)

## 2024-05-26 LAB — MAGNESIUM: Magnesium: 1.9 mg/dL (ref 1.7–2.4)

## 2024-05-26 MED ORDER — LACTATED RINGERS IV BOLUS
1000.0000 mL | Freq: Once | INTRAVENOUS | Status: AC
  Administered 2024-05-26: 1000 mL via INTRAVENOUS

## 2024-05-26 MED ORDER — MAGNESIUM SULFATE 2 GM/50ML IV SOLN
2.0000 g | Freq: Once | INTRAVENOUS | Status: AC
  Administered 2024-05-26: 2 g via INTRAVENOUS
  Filled 2024-05-26: qty 50

## 2024-05-26 MED ORDER — POTASSIUM CHLORIDE CRYS ER 20 MEQ PO TBCR: 20.0000 meq | EXTENDED_RELEASE_TABLET | Freq: Two times a day (BID) | ORAL | 0 refills | Status: AC

## 2024-05-26 MED ORDER — POTASSIUM CHLORIDE CRYS ER 20 MEQ PO TBCR
40.0000 meq | EXTENDED_RELEASE_TABLET | Freq: Once | ORAL | Status: AC
  Administered 2024-05-26: 40 meq via ORAL
  Filled 2024-05-26: qty 2

## 2024-05-26 NOTE — Discharge Instructions (Addendum)
 You are seen in the emergency room for diarrhea.  The workup in the emergency room reveals dehydration, you have received IV fluids in the ER.  You also have slightly low potassium, potassium supplements have been provided.  Continue to hydrate well.  Call your PCP for a follow-up next week.  If you start having bloody stools, high fevers, severe nausea or vomiting, severe dizziness or near fainting return to the ER immediately.

## 2024-05-26 NOTE — ED Triage Notes (Addendum)
 Pt reports with diarrhea since Friday and states that she has a headache. Pt is unable to keep anything down.

## 2024-05-26 NOTE — ED Provider Notes (Signed)
 [place] EMERGENCY DEPARTMENT AT [facility] Provider Note   CSN: [id number] Arrival date & time: 05/26/24  1200     History  Chief Complaint  Patient presents with   Diarrhea    Christina Webster is a 63 y.o. female.  HPI    63 year old female comes in with chief complaint of diarrhea.  Patient has history of hypertension, diabetes and history of breast cancer.  She reports having diarrhea for the last 4 days.  She is having 5-6 bowel movements a day, they are loose, watery.  Patient started noticing some lower quadrant abdominal discomfort today.  No sick contacts.  Patient denies any exposures to recently recalled cucumbers or tomatoes.  Patient has poor appetite, but denies any vomiting.  Review of system is positive for feeling weak, dizzy.  Patient concerned that she might be dehydrated.  Review of system negative for fevers, chills.  Home Medications Prior to Admission medications   Medication Sig Start Date End Date Taking? Authorizing Provider  anastrozole  (ARIMIDEX ) 1 MG tablet TAKE 1 TABLET BY MOUTH EVERY DAY 01/13/24   Causey, Lindsey Cornetto, NP  ARIPiprazole  (ABILIFY ) 2 MG tablet Take 2 mg by mouth daily. 04/20/23   [provider]  aspirin  EC 81 MG tablet Take 81 mg by mouth daily.    [provider]  [facility] MAGNESIUM 500 MG TABS Take 1 tablet by mouth at bedtime. 04/22/21   [provider]  cyclobenzaprine  (FLEXERIL ) 10 MG tablet Take 1 tablet (10 mg total) by mouth 2 (two) times daily as needed for up to 29 doses for muscle spasms. Patient not taking: Reported on 05/17/2023 12/24/19   Lowery Rue, DO  DULoxetine (CYMBALTA) 60 MG capsule Take 60 mg by mouth 2 (two) times daily. 10/07/20   [provider]  ferrous sulfate  325 (65 FE) MG tablet Take 325 mg by mouth daily with breakfast.    [provider]  hydroxyurea  (HYDREA ) 500 MG capsule Take 1 capsule (500 mg total) by mouth daily. May take with food to minimize GI  side effects. 10/12/21   Magrinat, Rozella Cornfield, MD  Insulin  Lispro Prot & Lispro (HUMALOG  MIX 75/25 KWIKPEN) (75-25) 100 UNIT/ML Kwikpen Inject 15 Units into the skin 2 (two) times daily with a meal. 05/17/23   Rai, Ripudeep K, MD  Insulin  Pen Needle (TECHLITE PEN NEEDLES) 32G X 4 MM MISC Use as directed to inject insulin . 05/17/23   Rai, Hurman Maiden, MD  losartan -hydrochlorothiazide  (HYZAAR) 50-12.5 MG tablet Take 1 tablet by mouth daily.  10/28/16   [provider]  ondansetron  (ZOFRAN ) 4 MG tablet Take 1 tablet (4 mg total) by mouth every 6 (six) hours as needed for nausea or vomiting. 05/17/23   Rai, Ripudeep K, MD  QULIPTA 60 MG TABS Take 1 tablet by mouth daily. 04/19/23   [provider]  rosuvastatin  (CRESTOR ) 10 MG tablet Take 10 mg by mouth at bedtime. 09/20/21   [provider]  SUMAtriptan (IMITREX) 50 MG tablet Take 50 mg by mouth every 2 (two) hours as needed for migraine. 09/29/21   [provider]  SYNJARDY XR 12.04-999 MG TB24 Take 2 tablets by mouth daily.  09/24/19   [provider]  topiramate  (TOPAMAX ) 100 MG tablet Take 100 mg by mouth at bedtime. 09/21/21   [provider]  traMADol (ULTRAM) 50 MG tablet Take 50 mg by mouth 5 (five) times daily as needed for moderate pain.  09/19/19   [provider]  TRULICITY 0.75 MG/0.5ML SOPN Inject 0.75 mg into the skin once a week. 09/23/21   [provider]      Allergies    Patient has no known allergies.    Review of Systems   Review of Systems  All other systems reviewed and are negative.   Physical Exam Updated Vital Signs BP (!) 108/95 (BP Location: Right Arm)   Pulse 100   Temp (!) 97.4 F (36.3 C) (Oral)   Resp 18   Ht 5' (1.524 m)   Wt 92.5 kg   LMP 11/24/2013 (LMP Unknown)   SpO2 100%   BMI 39.84 kg/m  Physical Exam Vitals and nursing note reviewed.  Constitutional:      Appearance: She is well-developed.  HENT:     Head: Atraumatic.      Mouth/Throat:     Mouth: Mucous membranes are dry.  Eyes:     Extraocular Movements: Extraocular movements intact.     Pupils: Pupils are equal, round, and reactive to light.  Cardiovascular:     Rate and Rhythm: Normal rate.  Pulmonary:     Effort: Pulmonary effort is normal.  Musculoskeletal:     Cervical back: Normal range of motion and neck supple.  Skin:    General: Skin is warm and dry.  Neurological:     Mental Status: She is alert and oriented to person, place, and time.     ED Results / Procedures / Treatments   Labs (all labs ordered are listed, but only abnormal results are displayed) Labs Reviewed  CBC WITH DIFFERENTIAL/PLATELET - Abnormal; Notable for the following components:      Result Value   WBC 13.5 (*)    Platelets 500 (*)    Neutro Abs 9.9 (*)    All other components within normal limits  COMPREHENSIVE METABOLIC PANEL WITH GFR - Abnormal; Notable for the following components:   Potassium 2.9 (*)    Glucose, Bld 109 (*)    BUN 35 (*)    Creatinine, Ser 1.36 (*)    Total Protein 8.6 (*)    GFR, Estimated 44 (*)    All other components within normal limits  MAGNESIUM  URINALYSIS, ROUTINE W REFLEX MICROSCOPIC    EKG None  Radiology No results found.  Procedures Procedures    Medications Ordered in ED Medications  potassium chloride  SA (KLOR-CON  M) CR tablet 40 mEq (has no administration in time range)  magnesium sulfate IVPB 2 g 50 mL (has no administration in time range)  lactated ringers  bolus 1,000 mL (1,000 mLs Intravenous New Bag/Given 05/26/24 1606)    ED Course/ Medical Decision Making/ A&P Clinical Course as of 05/26/24 1807  Tue May 26, 2024  1805 BUN(!): 35 Patient reassessed.  BUN is 35.  Creatinine is 1.36.  BUN/creatinine ratio over 20, she will receive 1 L of IV fluid. Fortunately, creatinine is pretty much at baseline. [AN]  1806 Platelets(!): 500 Not new. [AN]  1806 Magnesium: 1.9 Patient's K is 2.9.  Magnesium is 1.9,  barely normal.  We will give her IV magnesium and oral potassium. [AN]  1806 Patient reassessed.  She continues to have nonperitoneal abdominal exam.  Mild tenderness over the left lower quadrant.  [AN]    Clinical Course User Index [AN] Deatra Face, MD                                 Medical Decision  Making Amount and/or Complexity of Data Reviewed Labs: ordered.  Risk Prescription drug management.   This patient presents to the ED with chief complaint(s) of diarrhea with pertinent past medical history of diabetes, breast cancer.she denies any recent antibiotic use.  Patient currently not on chemotherapy.  The complaint involves an extensive differential diagnosis and also carries with it a high risk of complications and morbidity.    The differential diagnosis includes : Viral gastroenteritis, food toxin mediated process, C. difficile colitis, severe dehydration, renal failure, paraneoplastic process.  The initial plan is to get basic labs.  Patient is nontoxic-appearing.  No fevers here.  Heart rate in the 90s, no other concerning SIRS criteria.  We will hydrate the patient as well.  No indication for emergent CT at this time.  Given the duration of symptoms, no current risk factors, we will not get stool studies at this time, unless her labs are concerning.   Additional history obtained: Records reviewed oncology notes.  Patient currently not getting any chemotherapy, immunotherapy.  Independent labs interpretation:  The following labs were independently interpreted: Please see workup tab.  Low potassium, elevated BUN/creatinine ratio noted.   Treatment and Reassessment: Patient reassessed.  No diarrhea in the ER.  She is receiving IV fluids.  No vomiting here.  Repeat abdominal exam is reassuring.  Results of the ED workup discussed with her.  Oral potassium and IV magnesium ordered.  Anticipate discharge with PCP follow-up if not getting better.  Strict return precautions  already discussed..  Consideration for admission or further workup: CT scan was considered.  However, patient has reassuring abdominal exam, labs are reassuring.  No indication for it now.    Final Clinical Impression(s) / ED Diagnoses Final diagnoses:  Dehydration  Acute hypokalemia    Rx / DC Orders ED Discharge Orders     None         Deatra Face, MD 05/26/24 Jerre Moots

## 2024-11-02 ENCOUNTER — Other Ambulatory Visit: Payer: Self-pay | Admitting: *Deleted

## 2024-11-02 ENCOUNTER — Telehealth: Payer: Self-pay

## 2024-11-02 DIAGNOSIS — D0511 Intraductal carcinoma in situ of right breast: Secondary | ICD-10-CM

## 2024-11-02 NOTE — Telephone Encounter (Signed)
 Spoke with patient and patient stated she won't be able to come to appointment on 11/18;; sent message to [employer] (LPN) to have her send message to scheduling to reschedule the visit.

## 2024-11-03 ENCOUNTER — Inpatient Hospital Stay: Payer: Medicare HMO | Admitting: Hematology and Oncology

## 2024-11-03 ENCOUNTER — Inpatient Hospital Stay: Payer: Medicare HMO

## 2024-11-05 ENCOUNTER — Emergency Department (HOSPITAL_COMMUNITY)
Admission: EM | Admit: 2024-11-05 | Discharge: 2024-11-05 | Disposition: A | Discharge status: Home / Self Care | Attending: Emergency Medicine | Admitting: Emergency Medicine

## 2024-11-05 ENCOUNTER — Other Ambulatory Visit: Payer: Self-pay

## 2024-11-05 ENCOUNTER — Emergency Department (HOSPITAL_COMMUNITY)

## 2024-11-05 DIAGNOSIS — M546 Pain in thoracic spine: Secondary | ICD-10-CM | POA: Diagnosis not present

## 2024-11-05 DIAGNOSIS — M79602 Pain in left arm: Secondary | ICD-10-CM | POA: Diagnosis present

## 2024-11-05 DIAGNOSIS — I10 Essential (primary) hypertension: Secondary | ICD-10-CM | POA: Insufficient documentation

## 2024-11-05 DIAGNOSIS — Z79899 Other long term (current) drug therapy: Secondary | ICD-10-CM | POA: Insufficient documentation

## 2024-11-05 DIAGNOSIS — Z794 Long term (current) use of insulin: Secondary | ICD-10-CM | POA: Diagnosis not present

## 2024-11-05 DIAGNOSIS — Z7982 Long term (current) use of aspirin: Secondary | ICD-10-CM | POA: Diagnosis not present

## 2024-11-05 DIAGNOSIS — Y92410 Unspecified street and highway as the place of occurrence of the external cause: Secondary | ICD-10-CM | POA: Diagnosis not present

## 2024-11-05 MED ORDER — LIDOCAINE 5 % EX PTCH
1.0000 | MEDICATED_PATCH | CUTANEOUS | Status: DC
  Administered 2024-11-05: 1 via TRANSDERMAL
  Filled 2024-11-05: qty 1

## 2024-11-05 MED ORDER — ACETAMINOPHEN ER 650 MG PO TBCR: 650.0000 mg | EXTENDED_RELEASE_TABLET | Freq: Three times a day (TID) | ORAL | 0 refills | Status: AC | PRN

## 2024-11-05 MED ORDER — MELOXICAM 7.5 MG PO TABS: 7.5000 mg | ORAL_TABLET | Freq: Every day | ORAL | 0 refills | Status: AC

## 2024-11-05 MED ORDER — IBUPROFEN 200 MG PO TABS
600.0000 mg | ORAL_TABLET | Freq: Once | ORAL | Status: AC
Start: 2024-11-05 — End: 2024-11-05
  Administered 2024-11-05: 600 mg via ORAL
  Filled 2024-11-05: qty 3

## 2024-11-05 MED ORDER — METHOCARBAMOL 500 MG PO TABS: 500.0000 mg | ORAL_TABLET | Freq: Two times a day (BID) | ORAL | 0 refills | Status: AC

## 2024-11-05 MED ORDER — METHOCARBAMOL 500 MG PO TABS
500.0000 mg | ORAL_TABLET | Freq: Once | ORAL | Status: AC
  Administered 2024-11-05: 500 mg via ORAL
  Filled 2024-11-05: qty 1
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
## 2024-11-05 NOTE — Discharge Instructions (Addendum)
 Thank you for letting us  evaluate you today.  Your x-ray was negative for fracture.  We have given you pain medicine, muscle relaxer here in the emergency department.  Have also sent a muscle relaxer prescription to your pharmacy. Robaxin may cause drowsiness so do not operate heavy machinery including driving or drink alcohol with this.  You may take this at night or split the tablet in half if it makes you too drowsy.  You will likely be sore over the next few days.  You may also use ice for swelling, pain.  Return to Emergency Department if you experience altered mentation, chest pain, shortness of breath, significant worsening swelling.
